# Patient Record
Sex: Female | Born: 1990 | Race: Black or African American | Hispanic: No | Marital: Single | Smoking: Never smoker
Health system: Southern US, Community
[De-identification: ages and names within clinical notes are randomized; demographics above are authoritative.]

## PROBLEM LIST (undated history)

## (undated) DIAGNOSIS — F419 Anxiety disorder, unspecified: Secondary | ICD-10-CM

## (undated) DIAGNOSIS — F32A Depression, unspecified: Secondary | ICD-10-CM

## (undated) DIAGNOSIS — G35 Multiple sclerosis: Secondary | ICD-10-CM

## (undated) DIAGNOSIS — F329 Major depressive disorder, single episode, unspecified: Secondary | ICD-10-CM

## (undated) HISTORY — DX: Anxiety disorder, unspecified: F41.9

## (undated) HISTORY — PX: NO PAST SURGERIES: SHX2092

## (undated) HISTORY — DX: Depression, unspecified: F32.A

## (undated) HISTORY — PX: TOOTH EXTRACTION: SUR596

## (undated) HISTORY — DX: Major depressive disorder, single episode, unspecified: F32.9

---
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
## 2010-10-19 LAB — CBC
MCV: 93.6
Platelets: 297
RBC: 3.39 — ABNORMAL LOW
WBC: 9.6

## 2010-10-19 LAB — WET PREP, GENITAL: Yeast Wet Prep HPF POC: NONE SEEN

## 2010-10-19 LAB — HCG, QUANTITATIVE, PREGNANCY

## 2010-12-11 ENCOUNTER — Emergency Department (INDEPENDENT_AMBULATORY_CARE_PROVIDER_SITE_OTHER)
Admission: EM | Admit: 2010-12-11 | Discharge: 2010-12-11 | Disposition: A | Discharge status: Home / Self Care | Payer: 59 | Source: Home / Self Care | Attending: Family Medicine | Admitting: Family Medicine

## 2010-12-11 DIAGNOSIS — N390 Urinary tract infection, site not specified: Secondary | ICD-10-CM

## 2010-12-11 LAB — POCT URINALYSIS DIP (DEVICE)
Glucose, UA: NEGATIVE mg/dL
Specific Gravity, Urine: >=1.03 (ref 1.005–1.030)
Urobilinogen, UA: 1 mg/dL (ref 0.0–1.0)
pH: 6 (ref 5.0–8.0)

## 2010-12-11 MED ORDER — CEPHALEXIN 500 MG PO CAPS: 500.0000 mg | ORAL_CAPSULE | Freq: Two times a day (BID) | ORAL | Status: AC

## 2010-12-11 MED ORDER — FLUCONAZOLE 150 MG PO TABS: 150.0000 mg | ORAL_TABLET | Freq: Once | ORAL | Status: AC

## 2010-12-11 NOTE — ED Notes (Signed)
After having sex on Thursday, pt. Reports Urinary frequency, burning.  Spotting when voids as well.  Denies fevers.

## 2010-12-11 NOTE — ED Provider Notes (Signed)
History     CSN: [id number] Arrival date & time: 12/11/2010  8:36 AM   First MD Initiated Contact with Patient 12/11/10 0818      No chief complaint on file.   (Consider location/radiation/quality/duration/timing/severity/associated sxs/prior treatment) HPI Comments: Abigail Lewis presents for evaluation of dysuria that started several days ago after having sexual intercourse. She denies any fever, back pain, nausea, or vomiting.   Patient is a 20 y.o. female presenting with dysuria.  Dysuria  This is a new problem. The current episode started more than 2 days ago. The problem occurs intermittently. The problem has not changed since onset.The quality of the pain is described as burning. The pain is mild. There has been no fever. She is sexually active. There is no history of pyelonephritis. Associated symptoms include hematuria and urgency. Pertinent negatives include no chills, no sweats, no nausea, no vomiting, no discharge and no frequency. She has tried nothing for the symptoms.    No past medical history on file.  No past surgical history on file.  No family history on file.  History  Substance Use Topics  . Smoking status: Not on file  . Smokeless tobacco: Not on file  . Alcohol Use: Not on file    OB History    No data available      Review of Systems  Constitutional: Negative for fever and chills.  HENT: Negative.   Eyes: Negative.   Respiratory: Negative.   Cardiovascular: Negative.   Gastrointestinal: Negative for nausea and vomiting.  Genitourinary: Positive for dysuria, urgency and hematuria. Negative for frequency.  Neurological: Negative.     Allergies  Review of patient's allergies indicates not on file.  Home Medications  No current outpatient prescriptions on file.  BP 121/69  Pulse 79  Temp(Src) 98.3 F (36.8 C) (Oral)  Resp 12  SpO2 100%  Physical Exam  Constitutional: She is oriented to person, place, and time. She appears well-developed  and well-nourished.  HENT:  Head: Normocephalic and atraumatic.  Eyes: EOM are normal.  Neck: Normal range of motion.  Pulmonary/Chest: Effort normal.  Abdominal: Soft. Normal appearance and bowel sounds are normal. There is no tenderness. There is no CVA tenderness.  Neurological: She is alert and oriented to person, place, and time.  Skin: Skin is warm and dry.    ED Course  Procedures (including critical care time)  Labs Reviewed  POCT URINALYSIS DIP (DEVICE) - Abnormal; Notable for the following:    Bilirubin Urine SMALL (*)    Ketones, ur 15 (*)    Hgb urine dipstick LARGE (*)    Protein, ur >=300 (*)    Leukocytes, UA MODERATE (*) Biochemical Testing Only. Please order routine urinalysis from main lab if confirmatory testing is needed.   All other components within normal limits  POCT PREGNANCY, URINE  POCT PREGNANCY, URINE  POCT URINALYSIS DIPSTICK   No results found.   No diagnosis found.    MDM  Labs reviewed: UA moderate LE, nitrite negative        Richardo Priest, MD 12/11/10 [id number]

## 2011-10-10 ENCOUNTER — Encounter: Payer: Self-pay | Admitting: Obstetrics and Gynecology

## 2011-10-29 ENCOUNTER — Encounter: Payer: Self-pay | Admitting: Obstetrics and Gynecology

## 2013-06-20 ENCOUNTER — Encounter (HOSPITAL_COMMUNITY): Payer: Self-pay | Admitting: *Deleted

## 2013-06-20 ENCOUNTER — Inpatient Hospital Stay (HOSPITAL_COMMUNITY)
Admission: AD | Admit: 2013-06-20 | Discharge: 2013-06-20 | Disposition: A | Discharge status: Home / Self Care | Payer: 59 | Source: Ambulatory Visit | Attending: Obstetrics and Gynecology | Admitting: Obstetrics and Gynecology

## 2013-06-20 ENCOUNTER — Inpatient Hospital Stay (HOSPITAL_COMMUNITY): Payer: 59

## 2013-06-20 DIAGNOSIS — O200 Threatened abortion: Secondary | ICD-10-CM

## 2013-06-20 LAB — WET PREP, GENITAL
Trich, Wet Prep: NONE SEEN
Yeast Wet Prep HPF POC: NONE SEEN

## 2013-06-20 LAB — CBC
HEMATOCRIT: 35.1 % — AB (ref 36.0–46.0)
HEMOGLOBIN: 11.8 g/dL — AB (ref 12.0–15.0)
MCH: 31 pg (ref 26.0–34.0)
MCHC: 33.6 g/dL (ref 30.0–36.0)
MCV: 92.1 fL (ref 78.0–100.0)
Platelets: 196 10*3/uL (ref 150–400)
RBC: 3.81 MIL/uL — AB (ref 3.87–5.11)
RDW: 13 % (ref 11.5–15.5)
WBC: 8.9 10*3/uL (ref 4.0–10.5)

## 2013-06-20 LAB — POCT PREGNANCY, URINE: Preg Test, Ur: POSITIVE — AB

## 2013-06-20 LAB — HCG, QUANTITATIVE, PREGNANCY: hCG, Beta Chain, Quant, S: 645 m[IU]/mL — ABNORMAL HIGH (ref <5)

## 2013-06-20 IMAGING — US US OB COMP LESS 14 WK
1 series · 14 of 28 positions shown · non-contrast
Comparison: None.

CLINICAL DATA: Vaginal bleeding.

EXAM:
OBSTETRIC <14 WK US AND TRANSVAGINAL OB US
TECHNIQUE: Both transabdominal and transvaginal ultrasound examinations were
performed for complete evaluation of the gestation as well as the
maternal uterus, adnexal regions, and pelvic cul-de-sac.
Transvaginal technique was performed to assess early pregnancy.

[Series 1: us ob comp less 14 wks · 14 of 59 slices shown]
[im 3/59]
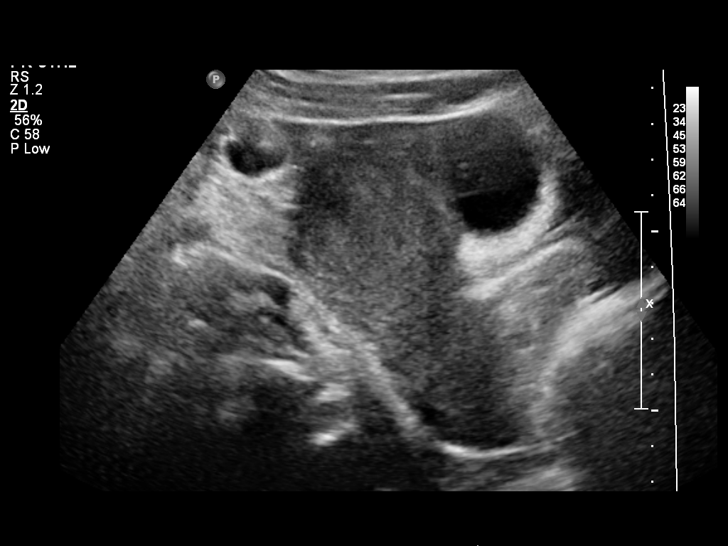
[im 7/59]
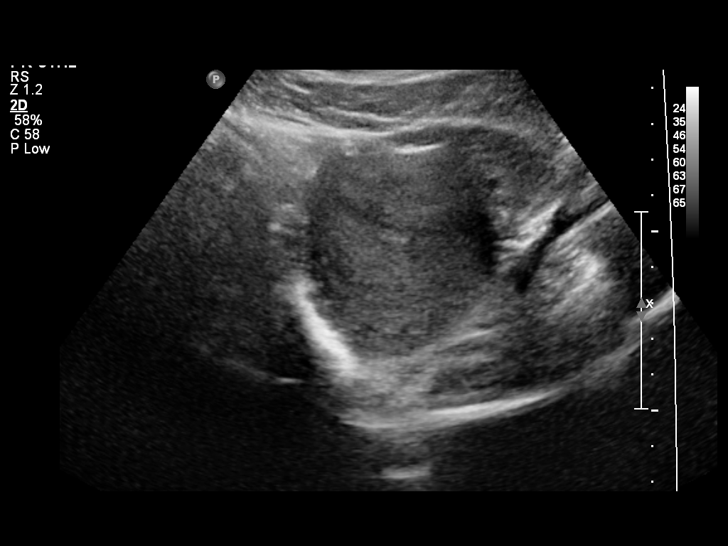
[im 11/59]
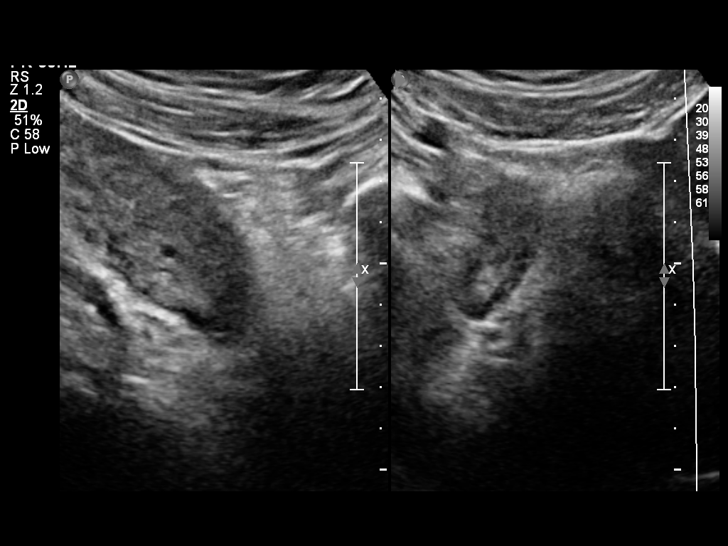
[im 16/59]
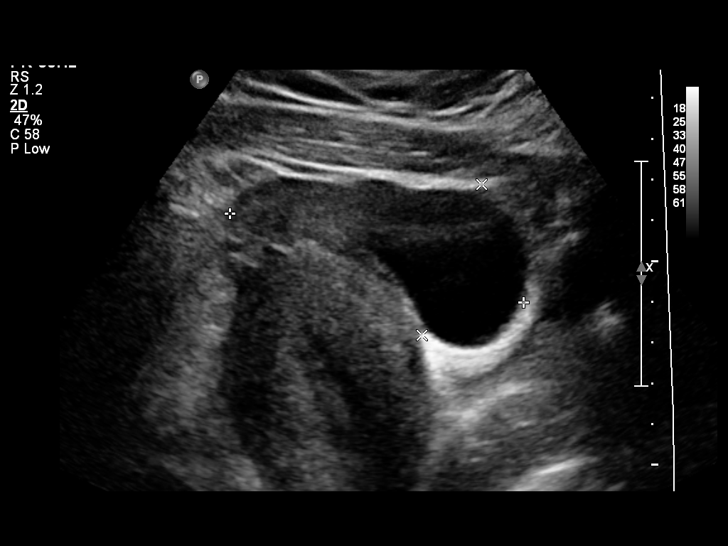
[im 20/59]
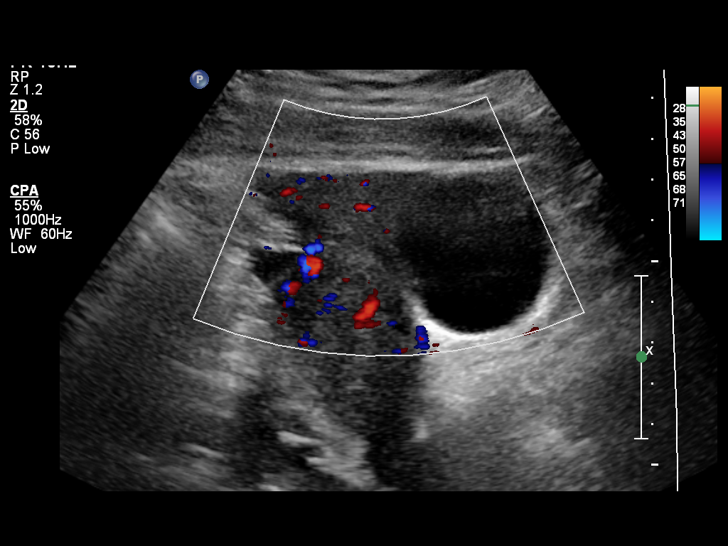
[im 24/59]
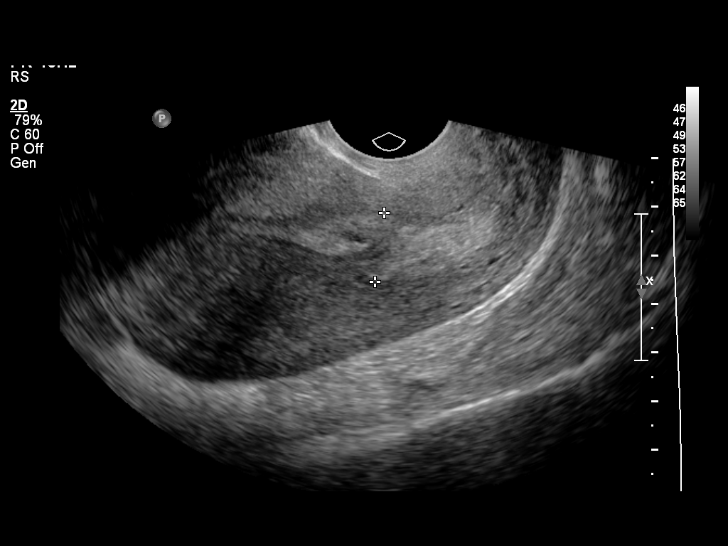
[im 28/59]
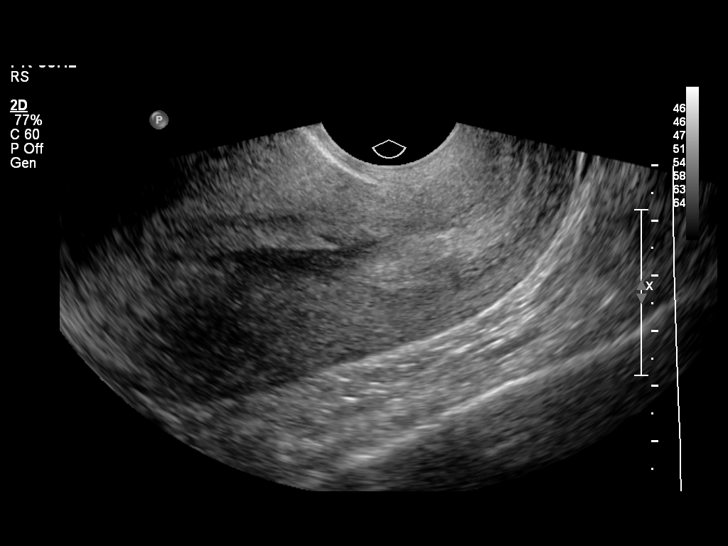
[im 33/59]
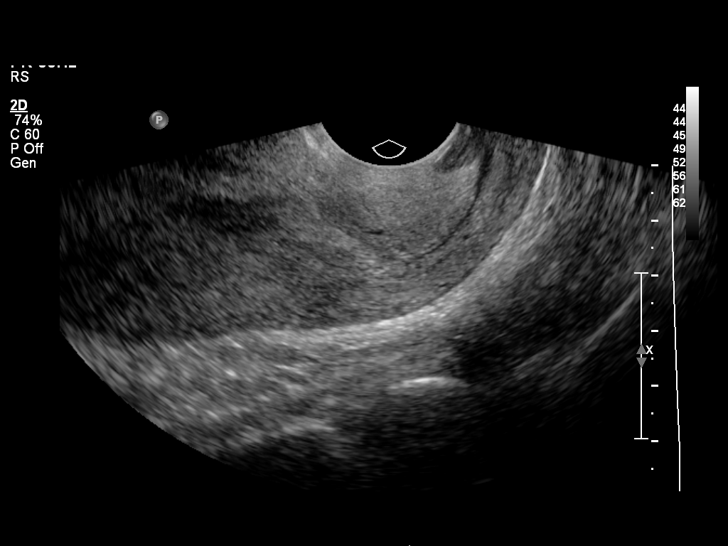
[im 37/59]
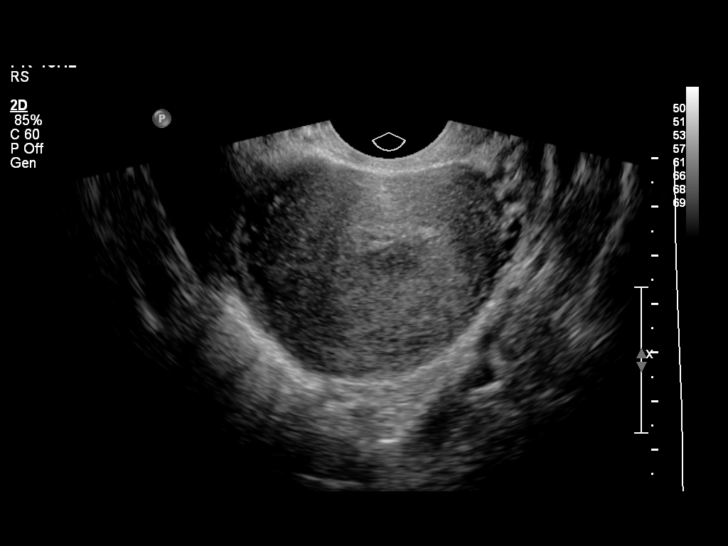
[im 41/59]
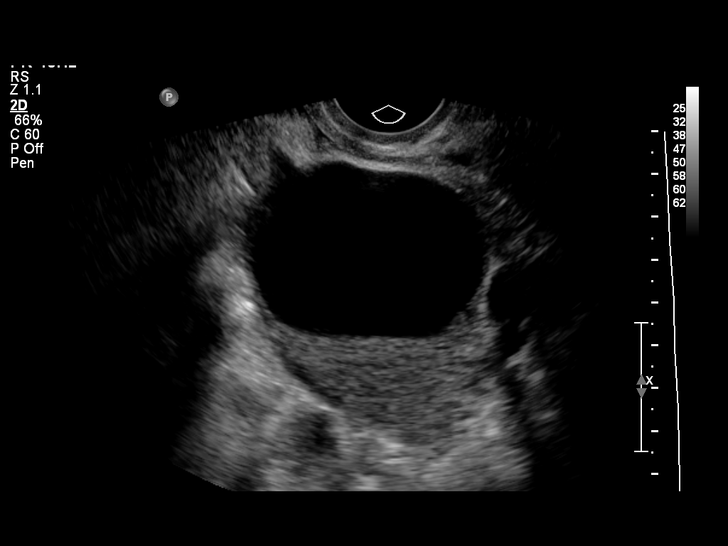
[im 46/59]
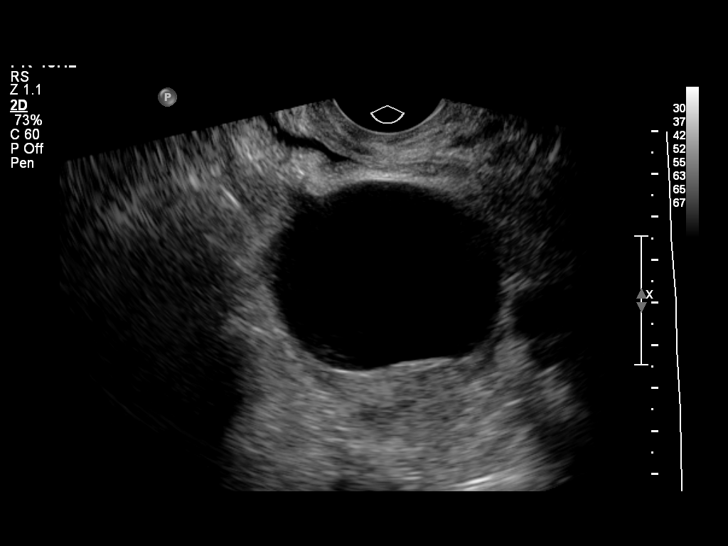
[im 50/59]
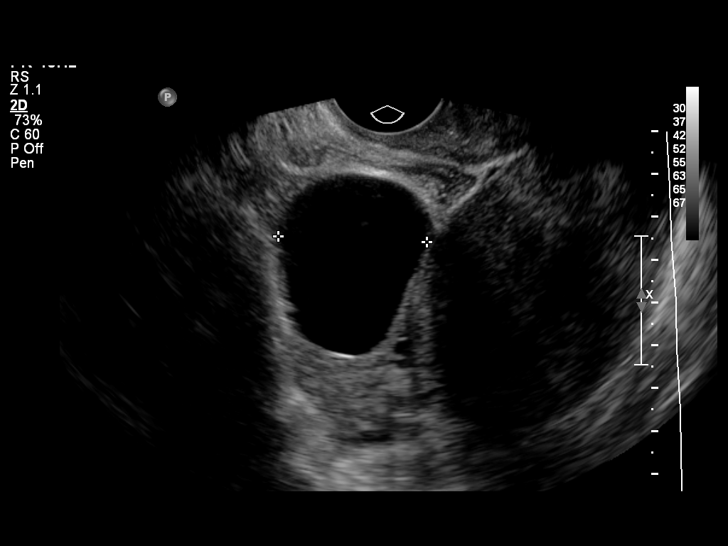
[im 54/59]
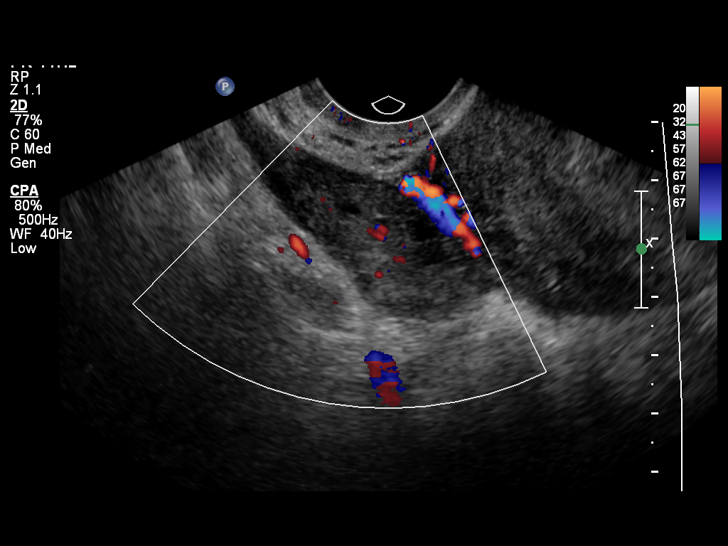
[im 59/59]
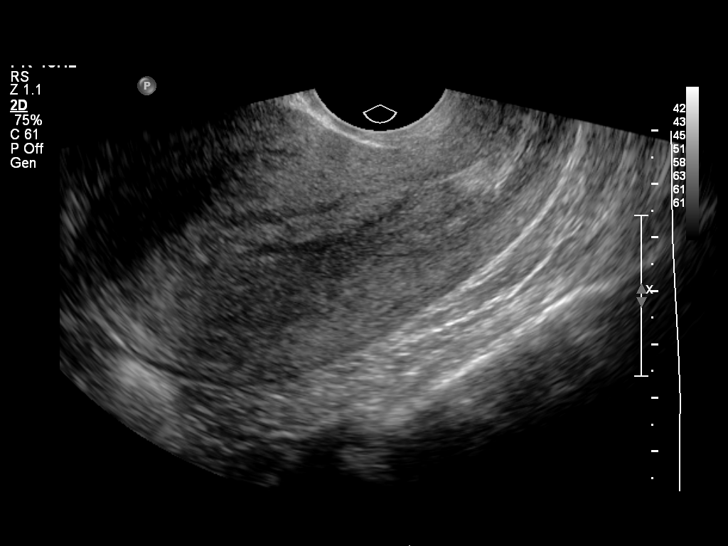

[14 of 28 positions shown; findings below may reference images not displayed]

FINDINGS: Intrauterine gestational sac: None

Yolk sac:  None

Embryo:  None

Cardiac Activity: None

Heart Rate:  Not applicable bpm

Maternal uterus/adnexae:

Subchorionic hemorrhage: Not apply

Right ovary: Normal

Left ovary: There is a cyst within the left ovary measuring 5.3 x
4.2 x 3.5 cm.

Other :The endometrium is thickened and has a heterogeneous
appearance measuring 14.3 mm.

Free fluid:  No free fluid.
IMPRESSION: 1. No intrauterine gestational sac, yolk sac, or fetal pole
identified. Differential considerations include intrauterine
pregnancy too early to be sonographically visualized, missed
abortion, or ectopic pregnancy. Followup ultrasound is recommended
in 10-14 days for further evaluation.

2.  Left ovarian cyst

## 2013-06-20 NOTE — ED Provider Notes (Deleted)
History    CSN: [id number]  Arrival date and time: 06/20/13 0920   First Provider Initiated Contact with Patient 06/20/13 0959            Chief Complaint   Patient presents with   .  Vaginal Bleeding    HPI Abigail Lewis is a 23 y.o. G2P0010 at [redacted]w[redacted]d who present with vaginal bleeding and abdominal pain. Last normal period was 4/4; states she has regular periods monthly. Not on birth control & doesn't use condoms. Has been with same partner x 2 years. Had negative HPT on 05/31/13.  Lower abdominal cramping x 1 week. Pain is intermittent & pt rates 6/10.Taken ibuprofen with no relief. No aggravating factors. Denies dysuria/vaginal discharge/vaginal irritation/nausea/vomiting/fevers.  Had light Franzoni spotting that began on 5/7 & lasted for 3 weeks. Heavier vaginal bleeding began 1 week ago with the abdominal cramping. Bleeding has been pink & red; has not saturated pads. States fills overnight pads while she is sleeping but otherwise 1 pad lasts throughout the day. On Monday, reports small clots & 1 large clot that looked clear & gelatinous. Denies lightheadedness or LOC.        Past Medical History   Diagnosis  Date   .  Medical history non-contributory         Past Surgical History   Procedure  Laterality  Date   .  No past surgeries         History reviewed. No pertinent family history.    History   Substance Use Topics   .  Smoking status:  Never Smoker    .  Smokeless tobacco:  Never Used   .  Alcohol Use:  No    Allergies: No Known Allergies    No prescriptions prior to admission     Review of Systems  Constitutional: Negative for fever, chills and malaise/fatigue.  Respiratory: Negative.   Cardiovascular: Negative.   Gastrointestinal: Positive for abdominal pain and diarrhea (watery Tuman stools x 3 since yesterday). Negative for heartburn, nausea, vomiting and blood in stool.  Genitourinary: Negative.   Neurological: Negative for dizziness and loss of  consciousness.     Physical Exam   Blood pressure 126/72, pulse 84, temperature 98.3 F (36.8 C), temperature source Oral, resp. rate 18, height 5' 2" (1.575 m), weight 180 lb (81.647 kg), last menstrual period 04/25/2013.  Physical Exam  Constitutional: She appears well-developed and well-nourished. No distress.  Cardiovascular: Normal rate, regular rhythm and normal heart sounds.   Respiratory: Effort normal and breath sounds normal. No respiratory distress.  GI: Soft. Bowel sounds are normal. She exhibits no distension and no mass. There is no tenderness. There is no rebound and no guarding.  Genitourinary: Vagina normal. Cervix exhibits no motion tenderness and no friability. Right adnexum displays no mass and no tenderness. Left adnexum displays no mass and no tenderness. No vaginal discharge found.  Moderate amount of dark red blood removed from vaginal vault; small amount of dark red blood coming from cervical os. Cervix closed, external os dilated finger tip.      MAU Course    Procedures    Results for orders placed during the hospital encounter of 06/20/13 (from the past 24 hour(s))   POCT PREGNANCY, URINE     Status: Abnormal     Collection Time      06/20/13  9:40 AM       Result  Value  Ref Range     Preg Test, Ur  POSITIVE (*)    NEGATIVE   CBC     Status: Abnormal     Collection Time      06/20/13 10:02 AM       Result  Value  Ref Range     WBC  8.9   4.0 - 10.5 K/uL     RBC  3.81 (*)  3.87 - 5.11 MIL/uL     Hemoglobin  11.8 (*)  12.0 - 15.0 g/dL     HCT  35.1 (*)  36.0 - 46.0 %     MCV  92.1   78.0 - 100.0 fL     MCH  31.0   26.0 - 34.0 pg     MCHC  33.6   30.0 - 36.0 g/dL     RDW  13.0   11.5 - 15.5 %     Platelets  196   150 - 400 K/uL   HCG, QUANTITATIVE, PREGNANCY     Status: Abnormal     Collection Time      06/20/13 10:03 AM       Result  Value  Ref Range     hCG, Beta Chain, Quant, S  645 (*)  <5 mIU/mL   WET PREP, GENITAL     Status: Abnormal      Collection Time      06/20/13 10:35 AM       Result  Value  Ref Range     Yeast Wet Prep HPF POC  NONE SEEN   NONE SEEN     Trich, Wet Prep  NONE SEEN   NONE SEEN     Clue Cells Wet Prep HPF POC  MODERATE (*)  NONE SEEN     WBC, Wet Prep HPF POC  MANY (*)  NONE SEEN       Us Ob Comp Less 14 Wks   06/20/2013   CLINICAL DATA:  Vaginal bleeding.  EXAM: OBSTETRIC <14 WK US AND TRANSVAGINAL OB US  TECHNIQUE: Both transabdominal and transvaginal ultrasound examinations were performed for complete evaluation of the gestation as well as the maternal uterus, adnexal regions, and pelvic cul-de-sac. Transvaginal technique was performed to assess early pregnancy.  COMPARISON:  None.  FINDINGS: Intrauterine gestational sac: None  Yolk sac:  None  Embryo:  None  Cardiac Activity: None  Heart Rate:  Not applicable bpm  Maternal uterus/adnexae:  Subchorionic hemorrhage: Not apply  Right ovary: Normal  Left ovary: There is a cyst within the left ovary measuring 5.3 x 4.2 x 3.5 cm.  Other :The endometrium is thickened and has a heterogeneous appearance measuring 14.3 mm.  Free fluid:  No free fluid.  IMPRESSION: 1. No intrauterine gestational sac, yolk sac, or fetal pole identified. Differential considerations include intrauterine pregnancy too early to be sonographically visualized, missed abortion, or ectopic pregnancy. Followup ultrasound is recommended in 10-14 days for further evaluation.  2.  Left ovarian cyst   Electronically Signed   By: Taylor  Stroud M.D.   On: 06/20/2013 11:36    Us Ob Transvaginal   06/20/2013   CLINICAL DATA:  Vaginal bleeding.  EXAM: OBSTETRIC <14 WK US AND TRANSVAGINAL OB US  TECHNIQUE: Both transabdominal and transvaginal ultrasound examinations were performed for complete evaluation of the gestation as well as the maternal uterus, adnexal regions, and pelvic cul-de-sac. Transvaginal technique was performed to assess early pregnancy.  COMPARISON:  None.  FINDINGS: Intrauterine  gestational sac: None  Yolk sac:  None  Embryo:  None  Cardiac Activity: None  Heart   Rate:  Not applicable bpm  Maternal uterus/adnexae:  Subchorionic hemorrhage: Not apply  Right ovary: Normal  Left ovary: There is a cyst within the left ovary measuring 5.3 x 4.2 x 3.5 cm.  Other :The endometrium is thickened and has a heterogeneous appearance measuring 14.3 mm.  Free fluid:  No free fluid.  IMPRESSION: 1. No intrauterine gestational sac, yolk sac, or fetal pole identified. Differential considerations include intrauterine pregnancy too early to be sonographically visualized, missed abortion, or ectopic pregnancy. Followup ultrasound is recommended in 10-14 days for further evaluation.  2.  Left ovarian cyst   Electronically Signed   By: Taylor  Stroud M.D.   On: 06/20/2013 11:36       Assessment and Plan       1.  Threatened miscarriage in early pregnancy         P:   Discharge home.   Vital signs stable. Pt to return to MAU on 6/1 for f/u HCG Discussed reasons to return to MAU. Discussed ectopic precautions.   Instructed pt to quit taking ibuprofen, can take Tylenol. Instructed pt to maintain pelvic rest until follow up.    Erin B Lawrence, Student-NP    06/20/2013, 12:53 PM    Saw this client with the student.  We have discussed the entire exam, labs, diagnosis, and plan.  Have reviewed the entire note and I am in agreement.  Rosamaria Donn, NP   Sabiha Sura L Ma Munoz, NP 06/20/13 1345      

## 2013-06-20 NOTE — MAU Note (Signed)
Started bleeding when expected period 3 wks ago.  Was light for 2 wks- then blood just started gushing out, 'passed something'.  Had a negative preg test the first wk when bleeding was light.

## 2013-06-20 NOTE — Discharge Instructions (Signed)
Vaginal Bleeding During Pregnancy, First Trimester A small amount of bleeding (spotting) from the vagina is relatively common in early pregnancy. It usually stops on its own. Various things may cause bleeding or spotting in early pregnancy. Some bleeding may be related to the pregnancy, and some may not. In most cases, the bleeding is normal and is not a problem. However, bleeding can also be a sign of something serious. Be sure to tell your health care provider about any vaginal bleeding right away. Some possible causes of vaginal bleeding during the first trimester include:  Infection or inflammation of the cervix.  Growths (polyps) on the cervix.  Miscarriage or threatened miscarriage.  Pregnancy tissue has developed outside of the uterus and in a fallopian tube (tubal pregnancy).  Tiny cysts have developed in the uterus instead of pregnancy tissue (molar pregnancy). HOME CARE INSTRUCTIONS  Watch your condition for any changes. The following actions may help to lessen any discomfort you are feeling:  Follow your health care provider's instructions for limiting your activity. If your health care provider orders bed rest, you may need to stay in bed and only get up to use the bathroom. However, your health care provider may allow you to continue light activity.  If needed, make plans for someone to help with your regular activities and responsibilities while you are on bed rest.  Keep track of the number of pads you use each day, how often you change pads, and how soaked (saturated) they are. Write this down.  Do not use tampons. Do not douche.  Do not have sexual intercourse or orgasms until approved by your health care provider.  If you pass any tissue from your vagina, save the tissue so you can show it to your health care provider.  Only take over-the-counter or prescription medicines as directed by your health care provider.  Do not take aspirin because it can make you  bleed.  Keep all follow-up appointments as directed by your health care provider. SEEK MEDICAL CARE IF:  You have any vaginal bleeding during any part of your pregnancy.  You have cramps or labor pains. SEEK IMMEDIATE MEDICAL CARE IF:   You have severe cramps in your back or belly (abdomen).  You have a fever, not controlled by medicine.  You pass large clots or tissue from your vagina.  Your bleeding increases.  You feel lightheaded or weak, or you have fainting episodes.  You have chills.  You are leaking fluid or have a gush of fluid from your vagina.  You pass out while having a bowel movement. MAKE SURE YOU:  Understand these instructions.  Will watch your condition.  Will get help right away if you are not doing well or get worse. Document Released: 10/18/2004 Document Revised: 10/29/2012 Document Reviewed: 09/15/2012 [facility] Patient Information 2014 [place].  Pelvic Rest Pelvic rest is sometimes recommended for women when:   The placenta is partially or completely covering the opening of the cervix (placenta previa).  There is bleeding between the uterine wall and the amniotic sac in the first trimester (subchorionic hemorrhage).  The cervix begins to open without labor starting (incompetent cervix, cervical insufficiency).  The labor is too early (preterm labor). HOME CARE INSTRUCTIONS  Do not have sexual intercourse, stimulation, or an orgasm.  Do not use tampons, douche, or put anything in the vagina.  Do not lift anything over 10 pounds (4.5 kg).  Avoid strenuous activity or straining your pelvic muscles. SEEK MEDICAL CARE IF:  You have any vaginal bleeding during pregnancy. Treat this as a potential emergency.  You have cramping pain felt low in the stomach (stronger than menstrual cramps).  You notice vaginal discharge (watery, mucus, or bloody).  You have a low, dull backache.  There are regular contractions or uterine  tightening. SEEK IMMEDIATE MEDICAL CARE IF: You have vaginal bleeding and have placenta previa.  Document Released: 05/05/2010 Document Revised: 04/02/2011 Document Reviewed: 05/05/2010 [facility] Patient Information 2014 [place], [place].

## 2013-06-21 NOTE — Progress Notes (Signed)
Attestation of Attending Supervision of Advanced Practitioner (CNM/NP): Evaluation and management procedures were performed by the Advanced Practitioner under my supervision and collaboration on 06/20/2013.  I have reviewed the Advanced Practitioner's note and chart, and I agree with the management and plan.  Lena Gores 06/21/2013 7:44 AM

## 2013-06-22 ENCOUNTER — Inpatient Hospital Stay (HOSPITAL_COMMUNITY)
Admission: AD | Admit: 2013-06-22 | Discharge: 2013-06-22 | Disposition: A | Discharge status: Home / Self Care | Payer: 59 | Source: Ambulatory Visit | Attending: Obstetrics & Gynecology | Admitting: Obstetrics & Gynecology

## 2013-06-22 DIAGNOSIS — O200 Threatened abortion: Secondary | ICD-10-CM

## 2013-06-22 LAB — GC/CHLAMYDIA PROBE AMP
CT Probe RNA: NEGATIVE
GC PROBE AMP APTIMA: NEGATIVE

## 2013-06-22 LAB — HCG, QUANTITATIVE, PREGNANCY: HCG, BETA CHAIN, QUANT, S: 283 m[IU]/mL — AB (ref <5)

## 2013-06-22 NOTE — MAU Provider Note (Signed)
  History     CSN: [id number]  Arrival date and time: 06/22/13 0943   None     Chief Complaint  Patient presents with  . Follow-up   HPI Comments: Abigail Lewis 23 y.o. G2P0010 [redacted]w[redacted]d presents to MAU for repeat BHCG. She was seen on 5/30 and her quant was 645. She has been bleeding with pad change q6 hours. She denies any pain. Her ultrasound from 5/30 showed no IUGS, yolk sac or fetal pole.      Past Medical History  Diagnosis Date  . Medical history non-contributory     Past Surgical History  Procedure Laterality Date  . No past surgeries      No family history on file.  History  Substance Use Topics  . Smoking status: Never Smoker   . Smokeless tobacco: Never Used  . Alcohol Use: No    Allergies: No Known Allergies  Prescriptions prior to admission  Medication Sig Dispense Refill  . Melatonin-Pyridoxine (MELATIN PO) Take 4 tablets by mouth at bedtime as needed (SLEEP).        Review of Systems  Constitutional: Negative.   HENT: Negative.   Eyes: Negative.   Respiratory: Negative.   Cardiovascular: Negative.   Genitourinary:       Vaginal bleeding, no pain  Skin: Negative.   Neurological: Negative.   Psychiatric/Behavioral: Negative.    Physical Exam   Blood pressure 124/83, pulse 75, temperature 98.8 F (37.1 C), temperature source Oral, resp. rate 16, last menstrual period 04/25/2013, SpO2 97.00%.  Physical Exam  Constitutional: She is oriented to person, place, and time. She appears well-developed and well-nourished. No distress.  HENT:  Head: Normocephalic and atraumatic.  Eyes: Pupils are equal, round, and reactive to light.  Genitourinary:  Not examined  Musculoskeletal: Normal range of motion.  Neurological: She is alert and oriented to person, place, and time.  Skin: Skin is warm and dry.  Psychiatric:  tearful    MAU Course  Procedures  MDM   Assessment and Plan   A: Threatened Miscarriage  P: Repeat Quant in Clinic on  Thursday June 4th between 8-11 am Continued Miscarriage precautions Rest/ Fluids/ Motrin as needed Return to MAU if pain developes   Doralee Albino Laroy Mustard 06/22/2013, 11:25 AM

## 2013-06-22 NOTE — Discharge Instructions (Signed)
Threatened Miscarriage   A threatened miscarriage is a pregnancy that may end. It may be marked by bleeding during the first 20 weeks of pregnancy. Often, the pregnancy can continue without any more problems. You may be asked to stop:  · Having sex (intercourse).  · Having orgasms.  · Using tampons.  · Exercising.  · Doing heavy physical activity and work.  HOME CARE   · Your doctor may tell you to take bed rest and to stop activities and work.  · Write down the number of pads you use each day. Write down how often you change pads. Write down how soaked they are.  · Follow your doctor's advice for follow-up visits and tests.  · If your blood type is Rh-negative and the father's blood is Rh-positive (or is not known), you may get a shot to protect the baby.  · If you have a miscarriage, save all the tissue you pass in a container. Take the container to your doctor.  GET HELP RIGHT AWAY IF:   · You have bad cramps or pain in your belly (abdomen), lower belly, or back.  · You have a fever or chills.  · Your bleeding gets worse or you pass large clots of blood or tissue. Save this tissue to show your doctor.  · You feel lightheaded, weak, dizzy, or pass out (faint).  · You have a gush of fluid from your vagina.  MAKE SURE YOU:   · Understand these instructions.  · Will watch your condition.  · Will get help right away if you are not doing well or get worse.  Document Released: 12/22/2007 Document Revised: 04/02/2011 Document Reviewed: 01/24/2009  [employer]® Patient Information ©2014 [employer].

## 2013-06-22 NOTE — MAU Note (Signed)
Patient to MAU for repeat BHCG. Patient denies pain but states she has bleeding and changes her pad about every 6 hours. No heavy bleeding.

## 2013-06-23 ENCOUNTER — Encounter: Payer: Self-pay | Admitting: Nurse Practitioner

## 2013-06-25 ENCOUNTER — Telehealth: Payer: Self-pay

## 2013-06-25 ENCOUNTER — Other Ambulatory Visit: Payer: 59

## 2013-06-25 DIAGNOSIS — O200 Threatened abortion: Secondary | ICD-10-CM

## 2013-06-25 LAB — HCG, QUANTITATIVE, PREGNANCY: hCG, Beta Chain, Quant, S: 144.9 m[IU]/mL

## 2013-06-25 NOTE — Telephone Encounter (Signed)
Received report from [facility], from [facility], and was informed that pts beta quant 144.9.  Per Dr. Debroah Loop pt needs come in one week for rpt beta and then a gyn f/u one week after that appt.   Called pt and I informed pt that her quant level is 144 which is going down like it is suppose to.  I advised her that the provider would like her to come in on June 11th for another quant level and then a week after that a visit with provider.  Pt stated "ok, I can be there at 0900 on the 11th."

## 2013-07-02 ENCOUNTER — Other Ambulatory Visit: Payer: 59

## 2013-07-02 DIAGNOSIS — N925 Other specified irregular menstruation: Secondary | ICD-10-CM

## 2013-07-02 DIAGNOSIS — N949 Unspecified condition associated with female genital organs and menstrual cycle: Secondary | ICD-10-CM

## 2013-07-03 LAB — HCG, QUANTITATIVE, PREGNANCY: hCG, Beta Chain, Quant, S: 17.8 m[IU]/mL

## 2013-07-09 ENCOUNTER — Ambulatory Visit: Payer: 59 | Admitting: Obstetrics & Gynecology

## 2013-07-14 ENCOUNTER — Telehealth: Payer: Self-pay

## 2013-07-14 NOTE — Telephone Encounter (Signed)
Message copied by Louanna Raw on Tue Jul 14, 2013  8:21 AM ------      Message from: Lesly Dukes      Created: Mon Jul 13, 2013  4:39 PM       Pt having good drop in Beta HCG.  Pt should come back for this week for beta hcg and discussion of birth control. ------

## 2013-07-14 NOTE — Telephone Encounter (Signed)
Called patieny and informed her of results. Patient verbalized understanding. No questions or concerns. Patient has appointment 07/22/13 to see provider and repeat quant.

## 2013-07-17 NOTE — MAU Provider Note (Signed)
History    CSN: [id number]  Arrival date and time: 06/20/13 9379   First Provider Initiated Contact with Patient 06/20/13 [phone]            Chief Complaint   Patient presents with   .  Vaginal Bleeding    HPI Abigail Lewis is a 23 y.o. G2P0010 at [redacted]w[redacted]d who present with vaginal bleeding and abdominal pain. Last normal period was 4/4; states she has regular periods monthly. Not on birth control & doesn't use condoms. Has been with same partner x 2 years. Had negative HPT on 05/31/13.  Lower abdominal cramping x 1 week. Pain is intermittent & pt rates 6/10.Taken ibuprofen with no relief. No aggravating factors. Denies dysuria/vaginal discharge/vaginal irritation/nausea/vomiting/fevers.  Had light Erekson spotting that began on 5/7 & lasted for 3 weeks. Heavier vaginal bleeding began 1 week ago with the abdominal cramping. Bleeding has been pink & red; has not saturated pads. States fills overnight pads while she is sleeping but otherwise 1 pad lasts throughout the day. On Monday, reports small clots & 1 large clot that looked clear & gelatinous. Denies lightheadedness or LOC.        Past Medical History   Diagnosis  Date   .  Medical history non-contributory         Past Surgical History   Procedure  Laterality  Date   .  No past surgeries         History reviewed. No pertinent family history.    History   Substance Use Topics   .  Smoking status:  Never Smoker    .  Smokeless tobacco:  Never Used   .  Alcohol Use:  No    Allergies: No Known Allergies    No prescriptions prior to admission     Review of Systems  Constitutional: Negative for fever, chills and malaise/fatigue.  Respiratory: Negative.   Cardiovascular: Negative.   Gastrointestinal: Positive for abdominal pain and diarrhea (watery Geppert stools x 3 since yesterday). Negative for heartburn, nausea, vomiting and blood in stool.  Genitourinary: Negative.   Neurological: Negative for dizziness and loss of  consciousness.     Physical Exam   Blood pressure 126/72, pulse 84, temperature 98.3 F (36.8 C), temperature source Oral, resp. rate 18, height 5\' 2"  (1.575 m), weight 180 lb (81.647 kg), last menstrual period 04/25/2013.  Physical Exam  Constitutional: She appears well-developed and well-nourished. No distress.  Cardiovascular: Normal rate, regular rhythm and normal heart sounds.   Respiratory: Effort normal and breath sounds normal. No respiratory distress.  GI: Soft. Bowel sounds are normal. She exhibits no distension and no mass. There is no tenderness. There is no rebound and no guarding.  Genitourinary: Vagina normal. Cervix exhibits no motion tenderness and no friability. Right adnexum displays no mass and no tenderness. Left adnexum displays no mass and no tenderness. No vaginal discharge found.  Moderate amount of dark red blood removed from vaginal vault; small amount of dark red blood coming from cervical os. Cervix closed, external os dilated finger tip.      MAU Course    Procedures    Results for orders placed during the hospital encounter of 06/20/13 (from the past 24 hour(s))   POCT PREGNANCY, URINE     Status: Abnormal     Collection Time      06/20/13  9:40 AM       Result  Value  Ref Range     Preg Test, Ur  POSITIVE (*)  NEGATIVE   CBC     Status: Abnormal     Collection Time      06/20/13 10:02 AM       Result  Value  Ref Range     WBC  8.9   4.0 - 10.5 K/uL     RBC  3.81 (*)  3.87 - 5.11 MIL/uL     Hemoglobin  11.8 (*)  12.0 - 15.0 g/dL     HCT  16.1 (*)  09.6 - 46.0 %     MCV  92.1   78.0 - 100.0 fL     MCH  31.0   26.0 - 34.0 pg     MCHC  33.6   30.0 - 36.0 g/dL     RDW  04.5   40.9 - 15.5 %     Platelets  196   150 - 400 K/uL   HCG, QUANTITATIVE, PREGNANCY     Status: Abnormal     Collection Time      06/20/13 10:03 AM       Result  Value  Ref Range     hCG, Beta Chain, Quant, S  645 (*)  <5 mIU/mL   WET PREP, GENITAL     Status: Abnormal      Collection Time      06/20/13 10:35 AM       Result  Value  Ref Range     Yeast Wet Prep HPF POC  NONE SEEN   NONE SEEN     Trich, Wet Prep  NONE SEEN   NONE SEEN     Clue Cells Wet Prep HPF POC  MODERATE (*)  NONE SEEN     WBC, Wet Prep HPF POC  MANY (*)  NONE SEEN       US Ob Comp Less 14 Wks   06/20/2013   CLINICAL DATA:  Vaginal bleeding.  EXAM: OBSTETRIC <14 WK [place] AND TRANSVAGINAL OB US  TECHNIQUE: Both transabdominal and transvaginal ultrasound examinations were performed for complete evaluation of the gestation as well as the maternal uterus, adnexal regions, and pelvic cul-de-sac. Transvaginal technique was performed to assess early pregnancy.  COMPARISON:  None.  FINDINGS: Intrauterine gestational sac: None  Yolk sac:  None  Embryo:  None  Cardiac Activity: None  Heart Rate:  Not applicable bpm  Maternal uterus/adnexae:  Subchorionic hemorrhage: Not apply  Right ovary: Normal  Left ovary: There is a cyst within the left ovary measuring 5.3 x 4.2 x 3.5 cm.  Other :The endometrium is thickened and has a heterogeneous appearance measuring 14.3 mm.  Free fluid:  No free fluid.  IMPRESSION: 1. No intrauterine gestational sac, yolk sac, or fetal pole identified. Differential considerations include intrauterine pregnancy too early to be sonographically visualized, missed abortion, or ectopic pregnancy. Followup ultrasound is recommended in 10-14 days for further evaluation.  2.  Left ovarian cyst   Electronically Signed   By: Signa Kell M.D.   On: 06/20/2013 11:36    US Ob Transvaginal   06/20/2013   CLINICAL DATA:  Vaginal bleeding.  EXAM: OBSTETRIC <14 WK [place] AND TRANSVAGINAL OB US  TECHNIQUE: Both transabdominal and transvaginal ultrasound examinations were performed for complete evaluation of the gestation as well as the maternal uterus, adnexal regions, and pelvic cul-de-sac. Transvaginal technique was performed to assess early pregnancy.  COMPARISON:  None.  FINDINGS: Intrauterine  gestational sac: None  Yolk sac:  None  Embryo:  None  Cardiac Activity: None  Heart  Rate:  Not applicable bpm  Maternal uterus/adnexae:  Subchorionic hemorrhage: Not apply  Right ovary: Normal  Left ovary: There is a cyst within the left ovary measuring 5.3 x 4.2 x 3.5 cm.  Other :The endometrium is thickened and has a heterogeneous appearance measuring 14.3 mm.  Free fluid:  No free fluid.  IMPRESSION: 1. No intrauterine gestational sac, yolk sac, or fetal pole identified. Differential considerations include intrauterine pregnancy too early to be sonographically visualized, missed abortion, or ectopic pregnancy. Followup ultrasound is recommended in 10-14 days for further evaluation.  2.  Left ovarian cyst   Electronically Signed   By: Signa Kellaylor  Stroud M.D.   On: 06/20/2013 11:36       Assessment and Plan       1.  Threatened miscarriage in early pregnancy         P:   Discharge home.   Vital signs stable. Pt to return to MAU on 6/1 for f/u HCG Discussed reasons to return to MAU. Discussed ectopic precautions.   Instructed pt to quit taking ibuprofen, can take Tylenol. Instructed pt to maintain pelvic rest until follow up.    Claudie ReveringErin B Lawrence, Student-NP    06/20/2013, 12:53 PM    Saw this client with the student.  We have discussed the entire exam, labs, diagnosis, and plan.  Have reviewed the entire note and I am in agreement.  Nolene Bernheimerri Burleson, NP   Currie Pariserri L Burleson, NP 06/20/13 1345

## 2013-07-17 NOTE — MAU Provider Note (Signed)
Attestation of Attending Supervision of Advanced Practitioner (CNM/NP): Evaluation and management procedures were performed by the Advanced Practitioner under my supervision and collaboration.  I have reviewed the Advanced Practitioner's note and chart, and I agree with the management and plan.  CONSTANT,PEGGY 07/17/2013 8:55 AM

## 2013-07-22 ENCOUNTER — Ambulatory Visit: Payer: 59 | Admitting: Obstetrics & Gynecology

## 2013-07-22 ENCOUNTER — Telehealth: Payer: Self-pay | Admitting: *Deleted

## 2013-07-22 NOTE — Telephone Encounter (Signed)
Called patient, no answer, left message that patient missed appointment and to please call and reschedule.

## 2013-08-17 ENCOUNTER — Ambulatory Visit: Payer: 59 | Admitting: Obstetrics & Gynecology

## 2013-11-23 ENCOUNTER — Encounter (HOSPITAL_COMMUNITY): Payer: Self-pay | Admitting: *Deleted

## 2014-02-15 ENCOUNTER — Encounter (HOSPITAL_COMMUNITY): Payer: Self-pay

## 2014-04-25 ENCOUNTER — Encounter (HOSPITAL_COMMUNITY): Payer: Self-pay | Admitting: *Deleted

## 2015-12-30 ENCOUNTER — Emergency Department (HOSPITAL_COMMUNITY)
Admission: EM | Admit: 2015-12-30 | Discharge: 2015-12-30 | Disposition: A | Discharge status: Home / Self Care | Payer: Commercial Managed Care - HMO | Attending: Emergency Medicine | Admitting: Emergency Medicine

## 2015-12-30 ENCOUNTER — Encounter (HOSPITAL_COMMUNITY): Payer: Self-pay | Admitting: Nurse Practitioner

## 2015-12-30 DIAGNOSIS — R252 Cramp and spasm: Secondary | ICD-10-CM | POA: Diagnosis not present

## 2015-12-30 DIAGNOSIS — K0889 Other specified disorders of teeth and supporting structures: Secondary | ICD-10-CM | POA: Diagnosis not present

## 2015-12-30 MED ORDER — ACETAMINOPHEN 500 MG PO TABS: 1000.0000 mg | ORAL_TABLET | Freq: Three times a day (TID) | ORAL | 0 refills | Status: DC | PRN

## 2015-12-30 MED ORDER — PENICILLIN V POTASSIUM 500 MG PO TABS: 500.0000 mg | ORAL_TABLET | Freq: Four times a day (QID) | ORAL | 0 refills | Status: AC

## 2015-12-30 MED ORDER — CYCLOBENZAPRINE HCL 10 MG PO TABS: 10.0000 mg | ORAL_TABLET | Freq: Two times a day (BID) | ORAL | 0 refills | Status: DC | PRN

## 2015-12-30 NOTE — Discharge Instructions (Signed)
Medications: Penicillin, Flexeril, Tylenol  Treatment: Take penicillin as prescribed for 1 week. Make sure to finish all this medication. Take Flexeril twice daily as needed for muscle spasms. Do not drive or operate machinery when taking this medication do not take if you are pregnant. Take Tylenol as prescribed every 8 hours. DO NOT take more than 3500 mg of Tylenol a day. Use warm salt water rinses 3-4 times daily, especially after eating.  Follow-up: Please follow-up with a dentist as soon as possible for further evaluation and treatment of your symptoms. Please return to emergency department if you develop any new or worsening symptoms.  In addition to the resources attached, you can also call: [facility]  [employer] [facility]  [address]  [place], [place] [address]  Phone [phone]

## 2015-12-30 NOTE — ED Triage Notes (Signed)
Rt. Lower back molar, pain and unable to chew for 1 month.  No facial swelling noted.  Pt. Denies any n/v/d,.  Low grade fever 99.  Pt. Also reports that shed woke up with her rt. Arm feeling weak.  Pt. Reports having a muscle aching in her rt. Upper arm.

## 2015-12-30 NOTE — ED Provider Notes (Signed)
[facility]-EMERGENCY DEPT Provider Note   CSN: [id number] Arrival date & time: 12/30/15  1541  By signing my name below, I, Doreatha Martin, attest that this documentation has been prepared under the direction and in the presence of Buel Ream, PA-C. Electronically Signed: Doreatha Martin, ED Scribe. 12/30/15. 4:45 PM.    History   Chief Complaint Chief Complaint  Patient presents with  . Dental Pain    HPI Abigail Lewis is a 25 y.o. female otherwise healthy who presents to the Emergency Department complaining of moderate, persistent right lower dental pain for a month with associated right jaw pain, gingival bleeding. Pt states she is not able to associate the onset of her dental pain with any specific dental fracture or injury. Pt states she has been taking 400-800 mg ibuprofen throughout the month without significant relief of pain. Pt is not currently followed by dentistry. Pt also complains of weakness in her right arm that began this morning, beginning in her shoulder and moving down to her elbow. She describes her weakness as her muscles "feeling tired". No h/o similar symptoms. She denies numbness or weakness in her right arm. She also denies fever, CP, SOB, abdominal pain, nausea, vomiting, dysuria, hematuria, frequency, urgency, neck pain. NKDA.   The history is provided by the patient. No language interpreter was used.    Past Medical History:  Diagnosis Date  . Medical history non-contributory     There are no active problems to display for this patient.   Past Surgical History:  Procedure Laterality Date  . NO PAST SURGERIES      OB History    Gravida Para Term Preterm AB Living   2       1 0   SAB TAB Ectopic Multiple Live Births   1               Home Medications    Prior to Admission medications   Medication Sig Start Date End Date Taking? Authorizing Provider  acetaminophen (TYLENOL) 500 MG tablet Take 2 tablets (1,000 mg total) by mouth every 8 (eight) hours  as needed for moderate pain. 12/30/15   Emi Holes, PA-C  cyclobenzaprine (FLEXERIL) 10 MG tablet Take 1 tablet (10 mg total) by mouth 2 (two) times daily as needed for muscle spasms. 12/30/15   Emi Holes, PA-C  Melatonin-Pyridoxine (MELATIN PO) Take 4 tablets by mouth at bedtime as needed (SLEEP).    Historical Provider, MD  penicillin v potassium (VEETID) 500 MG tablet Take 1 tablet (500 mg total) by mouth 4 (four) times daily. 12/30/15 01/06/16  Emi Holes, PA-C    Family History History reviewed. No pertinent family history.  Social History Social History  Substance Use Topics  . Smoking status: Never Smoker  . Smokeless tobacco: Never Used  . Alcohol use No     Allergies   Patient has no known allergies.   Review of Systems Review of Systems  Constitutional: Negative for chills and fever.  HENT: Positive for dental problem. Negative for facial swelling and sore throat.   Respiratory: Negative for shortness of breath.   Cardiovascular: Negative for chest pain.  Gastrointestinal: Negative for abdominal pain, nausea and vomiting.  Genitourinary: Negative for dysuria, frequency, hematuria and urgency.  Musculoskeletal: Negative for back pain and neck pain.  Skin: Negative for rash and wound.  Neurological: Positive for weakness. Negative for numbness and headaches.  Psychiatric/Behavioral: The patient is not nervous/anxious.      Physical Exam  Updated Vital Signs BP 130/94 (BP Location: Left Arm)   Pulse 74   Temp 99.3 F (37.4 C) (Oral)   Resp 18   Ht 5\' 2"  (1.575 m)   Wt 79.4 kg   SpO2 100%   BMI 32.01 kg/m   Physical Exam  Constitutional: She appears well-developed and well-nourished. No distress.  Patient sitting looking very tense with her R shoulder higher that the L  HENT:  Head: Normocephalic and atraumatic.  Mouth/Throat: Uvula is midline, oropharynx is clear and moist and mucous membranes are normal. No trismus in the jaw. No dental  abscesses or uvula swelling. No oropharyngeal exudate, posterior oropharyngeal edema, posterior oropharyngeal erythema or tonsillar abscesses. No tonsillar exudate.    Tenderness to the right lower jaw, but no masses or lymph tenderness to the right side of the neck.   Eyes: Conjunctivae are normal. Pupils are equal, round, and reactive to light. Right eye exhibits no discharge. Left eye exhibits no discharge. No scleral icterus.  Neck: Normal range of motion. Neck supple. No thyromegaly present.  Cardiovascular: Normal rate, regular rhythm, normal heart sounds and intact distal pulses.  Exam reveals no gallop and no friction rub.   No murmur heard. Pulmonary/Chest: Effort normal and breath sounds normal. No stridor. No respiratory distress. She has no wheezes. She has no rales.  Abdominal: Soft. Bowel sounds are normal. She exhibits no distension. There is no tenderness. There is no rebound and no guarding.  Musculoskeletal: She exhibits no edema.       Right shoulder: She exhibits spasm.  Lymphadenopathy:    She has no cervical adenopathy.  Neurological: She is alert. Coordination normal.  Normal sensation to upper extremities; 5/5 strength in upper extremities (flexion and extension); equal bilateral grip strength   Skin: Skin is warm and dry. No rash noted. She is not diaphoretic. No pallor.  Psychiatric: She has a normal mood and affect.  Nursing note and vitals reviewed.    ED Treatments / Results   DIAGNOSTIC STUDIES: Oxygen Saturation is 100% on RA, normal by my interpretation.    COORDINATION OF CARE: 4:41 PM Discussed treatment plan with pt at bedside which includes Penicillin, dental resource guide and pt agreed to plan.    Labs (all labs ordered are listed, but only abnormal results are displayed) Labs Reviewed - No data to display  EKG  EKG Interpretation None       Radiology No results found.  Procedures Procedures (including critical care  time)  Medications Ordered in ED Medications - No data to display   Initial Impression / Assessment and Plan / ED Course  I have reviewed the triage vital signs and the nursing notes.  Pertinent labs & imaging results that were available during my care of the patient were reviewed by me and considered in my medical decision making (see chart for details).  Clinical Course     Patient with dentalgia. On exam, there is no evidence of a drainable abscess. No trismus, glossal elevation, unilateral tonsillar swelling. No evidence of retropharyngeal or peritonsillar abscess or Ludwig angina. Normal neuro exam with full motor strength and sensation of right upper extremity. Suspect patient has been tense due to her pain on the right side causing muscle spasm and fatigue. Will treat with Veetid, Flexeril and Tylenol. Pt instructed to follow-up with dentist as soon as possible. Resource guide provided with AVS. Discussed strict return precautions. Pt safe for discharge. Pt is agreeable to plan. I discussed patient case with  Dr. Karma Ganja who agrees with plan.    Final Clinical Impressions(s) / ED Diagnoses   Final diagnoses:  Pain, dental    New Prescriptions New Prescriptions   ACETAMINOPHEN (TYLENOL) 500 MG TABLET    Take 2 tablets (1,000 mg total) by mouth every 8 (eight) hours as needed for moderate pain.   CYCLOBENZAPRINE (FLEXERIL) 10 MG TABLET    Take 1 tablet (10 mg total) by mouth 2 (two) times daily as needed for muscle spasms.   PENICILLIN V POTASSIUM (VEETID) 500 MG TABLET    Take 1 tablet (500 mg total) by mouth 4 (four) times daily.    I personally performed the services described in this documentation, which was scribed in my presence. The recorded information has been reviewed and is accurate.    Emi Holes, PA-C 12/30/15 1703    Jerelyn Scott, MD 12/30/15 1710

## 2015-12-31 ENCOUNTER — Ambulatory Visit (INDEPENDENT_AMBULATORY_CARE_PROVIDER_SITE_OTHER): Payer: Commercial Managed Care - HMO | Admitting: Family Medicine

## 2015-12-31 VITALS — BP 120/70 | HR 94 | Temp 98.2°F | Resp 18 | Ht 62.0 in | Wt 183.0 lb

## 2015-12-31 DIAGNOSIS — K068 Other specified disorders of gingiva and edentulous alveolar ridge: Secondary | ICD-10-CM

## 2015-12-31 MED ORDER — DOXYCYCLINE HYCLATE 100 MG PO CAPS: 100.0000 mg | ORAL_CAPSULE | Freq: Two times a day (BID) | ORAL | 0 refills | Status: DC

## 2015-12-31 NOTE — Progress Notes (Signed)
   Patient ID: Abigail SchickDeaundra C Lewis, female    DOB: 03/08/1990, 25 y.o.   MRN: [id number]  PCP: Pcp Not In System  Chief Complaint  Patient presents with  . Dental Pain    Was seen in the ER yesterday - was given Penicillin, states after she took it the pain worsened and now jaw is swollen     Subjective:  New Patient HPI 25 year old female presents for evaluation of dental pain Felt a sharp pain in lower jaw area after taking penicillin. Pain has since resolved.  She has only taken Tylenol since for pain and has stopped pencillian. Woke up today and noticed that her right jaw was swollen and tender. Originally had bleeding gums, that has since resolved.  Social History   Social History  . Marital status: Single    Spouse name: N/A  . Number of children: N/A  . Years of education: N/A   Occupational History  . Not on file.   Social History Main Topics  . Smoking status: Never Smoker  . Smokeless tobacco: Never Used  . Alcohol use No  . Drug use: No  . Sexual activity: Yes    Birth control/ protection: Pill   Other Topics Concern  . Not on file   Social History Narrative  . No narrative on file    No family history on file.   Review of Systems See HPI   There are no active problems to display for this patient.    Prior to Admission medications   Medication Sig Start Date End Date Taking? Authorizing Provider  acetaminophen (TYLENOL) 500 MG tablet Take 2 tablets (1,000 mg total) by mouth every 8 (eight) hours as needed for moderate pain. 12/30/15  Yes Alexandra M Law, PA-C  cyclobenzaprine (FLEXERIL) 10 MG tablet Take 1 tablet (10 mg total) by mouth 2 (two) times daily as needed for muscle spasms. 12/30/15  Yes Alexandra M Law, PA-C  Melatonin-Pyridoxine (MELATIN PO) Take 4 tablets by mouth at bedtime as needed (SLEEP).   Yes Historical Provider, MD  penicillin v potassium (VEETID) 500 MG tablet Take 1 tablet (500 mg total) by mouth 4 (four) times daily. Patient  not taking: Reported on 12/31/2015 12/30/15 01/06/16  Emi HolesAlexandra M Law, PA-C  No Known Allergies     Objective:  Physical Exam  Constitutional: She is oriented to person, place, and time. She appears well-developed and well-nourished.  HENT:  Head: Normocephalic and atraumatic.    Nose: Nose normal.  Mouth/Throat: Oropharynx is clear and moist. Abnormal dentition.    Neck: Normal range of motion.  Cardiovascular: Normal rate.   Pulmonary/Chest: Effort normal.  Lymphadenopathy:    She has no cervical adenopathy.  Neurological: She is alert and oriented to person, place, and time.  Skin: Skin is warm and dry.  Psychiatric: She has a normal mood and affect. Her behavior is normal. Thought content normal.     Vitals:   12/31/15 1343  BP: 120/70  Pulse: 94  Resp: 18  Temp: 98.2 F (36.8 C)   Assessment & Plan:  1. Pain in gums  Plan: -Doxycycline 100 mg twice daily x 10 days -Follow-up with a dentist for further evaluation.  Godfrey PickKimberly S. Tiburcio PeaHarris, MSN, FNP-C [facility]

## 2015-12-31 NOTE — Patient Instructions (Addendum)
Start Doxycycline 100 mg twice daily x 7 days.  Take Benadryl 12 mg once or twice if needed to take to reduce swelling.   IF you received an x-ray today, you will receive an invoice from [facility]. Please contact [facility] at [phone] with questions or concerns regarding your invoice.   IF you received labwork today, you will receive an invoice from [employer]. Please contact [facility] at [phone] with questions or concerns regarding your invoice.   Our billing staff will not be able to assist you with questions regarding bills from these companies.  You will be contacted with the lab results as soon as they are available. The fastest way to get your results is to activate your My Chart account. Instructions are located on the last page of this paperwork. If you have not heard from [place] regarding the results in 2 weeks, please contact this office.     Dental Pain Introduction Dental pain may be caused by many things, including:  Tooth decay (cavities or caries). Cavities cause the nerve of your tooth to be open to air and hot or cold temperatures. This can cause pain or discomfort.  Abscess or infection. A dental abscess is an area that is full of infected pus from a bacterial infection in the inner part of the tooth (pulp). It usually happens at the end of the tooth's root.  Injury.  An unknown reason (idiopathic). Your pain may be mild or severe. It may only happen when:  You are chewing.  You are exposed to hot or cold temperature.  You are eating or drinking sugary foods or beverages, such as:  Soda.  Candy. Your pain may also be there all of the time. Follow these instructions at home: Watch your dental pain for any changes. Do these things to lessen your discomfort:  Take medicines only as told by your dentist.  If your dentist tells you to take an antibiotic medicine, finish all of it even if you start to feel  better.  Keep all follow-up visits as told by your dentist. This is important.  Do not apply heat to the outside of your face.  Rinse your mouth or gargle with salt water if told by your dentist. This helps with pain and swelling.  You can make salt water by adding  tsp of salt to 1 cup of warm water.  Apply ice to the painful area of your face:  Put ice in a plastic bag.  Place a towel between your skin and the bag.  Leave the ice on for 20 minutes, 2-3 times per day.  Avoid foods or drinks that cause you pain, such as:  Very hot or very cold foods or drinks.  Sweet or sugary foods or drinks. Contact a doctor if:  Your pain is not helped with medicines.  Your symptoms are worse.  You have new symptoms. Get help right away if:  You cannot open your mouth.  You are having trouble breathing or swallowing.  You have a fever.  Your face, neck, or jaw is puffy (swollen). This information is not intended to replace advice given to you by your health care provider. Make sure you discuss any questions you have with your health care provider. Document Released: 06/27/2007 Document Revised: 06/16/2015 Document Reviewed: 01/04/2014  2017 [employer]

## 2016-01-08 ENCOUNTER — Encounter: Payer: Self-pay | Admitting: Family Medicine

## 2016-01-14 ENCOUNTER — Other Ambulatory Visit: Payer: Self-pay | Admitting: Family Medicine

## 2016-07-12 ENCOUNTER — Ambulatory Visit: Payer: 59 | Attending: Family Medicine | Admitting: Licensed Clinical Social Worker

## 2016-07-12 ENCOUNTER — Encounter: Payer: Self-pay | Admitting: Family Medicine

## 2016-07-12 ENCOUNTER — Ambulatory Visit: Payer: 59 | Attending: Family Medicine | Admitting: Family Medicine

## 2016-07-12 VITALS — BP 106/69 | HR 73 | Temp 98.2°F | Resp 18 | Ht 63.0 in | Wt 169.6 lb

## 2016-07-12 DIAGNOSIS — F99 Mental disorder, not otherwise specified: Secondary | ICD-10-CM | POA: Diagnosis not present

## 2016-07-12 DIAGNOSIS — F4010 Social phobia, unspecified: Secondary | ICD-10-CM | POA: Diagnosis present

## 2016-07-12 DIAGNOSIS — G4700 Insomnia, unspecified: Secondary | ICD-10-CM | POA: Diagnosis not present

## 2016-07-12 DIAGNOSIS — F5105 Insomnia due to other mental disorder: Secondary | ICD-10-CM | POA: Diagnosis not present

## 2016-07-12 DIAGNOSIS — F411 Generalized anxiety disorder: Secondary | ICD-10-CM | POA: Diagnosis not present

## 2016-07-12 DIAGNOSIS — Z0000 Encounter for general adult medical examination without abnormal findings: Secondary | ICD-10-CM

## 2016-07-12 DIAGNOSIS — F329 Major depressive disorder, single episode, unspecified: Secondary | ICD-10-CM

## 2016-07-12 DIAGNOSIS — F419 Anxiety disorder, unspecified: Principal | ICD-10-CM

## 2016-07-12 DIAGNOSIS — Z13220 Encounter for screening for lipoid disorders: Secondary | ICD-10-CM | POA: Diagnosis not present

## 2016-07-12 DIAGNOSIS — Z8759 Personal history of other complications of pregnancy, childbirth and the puerperium: Secondary | ICD-10-CM | POA: Insufficient documentation

## 2016-07-12 MED ORDER — TRAZODONE HCL 50 MG PO TABS: 50.0000 mg | ORAL_TABLET | Freq: Every evening | ORAL | 1 refills | Status: DC | PRN

## 2016-07-12 MED ORDER — SERTRALINE HCL 25 MG PO TABS: 25.0000 mg | ORAL_TABLET | Freq: Every day | ORAL | 1 refills | Status: DC

## 2016-07-12 NOTE — Progress Notes (Signed)
Subjective:  Patient ID: Abigail Lewis, female    DOB: 06/13/1990  Age: 26 y.o. MRN: [id number]  CC: Establish Care   HPI Abigail Lewis presents for complains of anxiety disorder.  She has the following symptoms: difficulty concentrating, insomnia, racing thoughts, anxiety in social situations.. Onset of symptoms was approximately several years ago, gradually worsening since that time. She reports experiencing these symptoms since she was a teenager. She denies current suicidal and homicidal ideation. Family history significant for father with alcoholism, mother with bipolar depression, and grandmother with mental disorder. Possible organic causes contributing are: none. Risk factors: positive family history of mental disorder in grandmother, mother, and father. Previous treatment includes Zoloft and trazadone. She reports medication being changed from Zoloft to Trazodone for sleep. She denies any negative side effects from either treatment.She also reports receiving counseling services when she was a teenager but none since adulthood. She is agreeable to speaking with LCSW today. She reports recent history of annual physical from another clinic where she was told her cholesterol levels were borderline and was encouraged to follow up for recheck in 3 months. She is requesting cholesterol check today.Denies any history of HTN.  She denies any family history of HLD or heart disease. She does reports her mother has history of HTN.    Outpatient Medications Prior to Visit  Medication Sig Dispense Refill  . acetaminophen (TYLENOL) 500 MG tablet Take 2 tablets (1,000 mg total) by mouth every 8 (eight) hours as needed for moderate pain. (Patient not taking: Reported on 07/12/2016) 30 tablet 0  . cyclobenzaprine (FLEXERIL) 10 MG tablet Take 1 tablet (10 mg total) by mouth 2 (two) times daily as needed for muscle spasms. (Patient not taking: Reported on 07/12/2016) 14 tablet 0  . doxycycline (VIBRAMYCIN)  100 MG capsule Take 1 capsule (100 mg total) by mouth 2 (two) times daily. (Patient not taking: Reported on 07/12/2016) 20 capsule 0  . Melatonin-Pyridoxine (MELATIN PO) Take 4 tablets by mouth at bedtime as needed (SLEEP).     No facility-administered medications prior to visit.     ROS Review of Systems  Constitutional: Negative.   Respiratory: Negative.   Cardiovascular: Negative.   Psychiatric/Behavioral: Positive for sleep disturbance. Negative for suicidal ideas. The patient is nervous/anxious.     Objective:  BP 106/69 (BP Location: Left Arm, Patient Position: Sitting, Cuff Size: Normal)   Pulse 73   Temp 98.2 F (36.8 C) (Oral)   Resp 18   Ht 5\' 3"  (1.6 m)   Wt 169 lb 9.6 oz (76.9 kg)   SpO2 99%   BMI 30.04 kg/m   BP/Weight 07/12/2016 12/31/2015 12/30/2015  Systolic BP 106 120 130  Diastolic BP 69 70 94  Wt. (Lbs) 169.6 183 175  BMI 30.04 33.47 32.01     Physical Exam  Constitutional: She appears well-developed and well-nourished.  Cardiovascular: Normal rate, regular rhythm, normal heart sounds and intact distal pulses.   Pulmonary/Chest: Effort normal and breath sounds normal.  Skin: Skin is warm and dry.  Psychiatric: Her speech is normal. Her mood appears anxious. She expresses no homicidal and no suicidal ideation. She expresses no suicidal plans and no homicidal plans. She is attentive.  Nursing note and vitals reviewed.  Assessment & Plan:   Problem List Items Addressed This Visit       Other Visit Diagnoses    Generalized anxiety disorder    -  Primary   LCSW spoke with patient and provided resources  Relevant Medications   sertraline (ZOLOFT) 25 MG tablet   Social anxiety disorder       Relevant Medications   sertraline (ZOLOFT) 25 MG tablet   Insomnia due to other mental disorder       Relevant Medications   traZODone (DESYREL) 50 MG tablet   Screening cholesterol level       Relevant Orders   Lipid Panel   Healthcare maintenance        Relevant Orders   HIV antibody (with reflex)      Meds ordered this encounter  Medications  . sertraline (ZOLOFT) 25 MG tablet    Sig: Take 1 tablet (25 mg total) by mouth daily.    Dispense:  30 tablet    Refill:  1    Order Specific Question:   Supervising Provider    Answer:   Quentin Angst [id number]  . traZODone (DESYREL) 50 MG tablet    Sig: Take 1 tablet (50 mg total) by mouth at bedtime as needed for sleep.    Dispense:  30 tablet    Refill:  1    Order Specific Question:   Supervising Provider    Answer:   Quentin Angst [id number]    Follow-up: Return in about 8 weeks (around 09/06/2016) for GAD.   Lizbeth Bark FNP

## 2016-07-12 NOTE — Progress Notes (Signed)
Patient is here for establish care  Patient decline tdap shot for today   Patient already has her PAP scheduled @ her [facility]

## 2016-07-12 NOTE — Patient Instructions (Addendum)
Generalized Anxiety Disorder, Adult Generalized anxiety disorder (GAD) is a mental health disorder. People with this condition constantly worry about everyday events. Unlike normal anxiety, worry related to GAD is not triggered by a specific event. These worries also do not fade or get better with time. GAD interferes with life functions, including relationships, work, and school. GAD can vary from mild to severe. People with severe GAD can have intense waves of anxiety with physical symptoms (panic attacks). What are the causes? The exact cause of GAD is not known. What increases the risk? This condition is more likely to develop in:  Women.  People who have a family history of anxiety disorders.  People who are very shy.  People who experience very stressful life events, such as the death of a loved one.  People who have a very stressful family environment.  What are the signs or symptoms? People with GAD often worry excessively about many things in their lives, such as their health and family. They may also be overly concerned about:  Doing well at work.  Being on time.  Natural disasters.  Friendships.  Physical symptoms of GAD include:  Fatigue.  Muscle tension or having muscle twitches.  Trembling or feeling shaky.  Being easily startled.  Feeling like your heart is pounding or racing.  Feeling out of breath or like you cannot take a deep breath.  Having trouble falling asleep or staying asleep.  Sweating.  Nausea, diarrhea, or irritable bowel syndrome (IBS).  Headaches.  Trouble concentrating or remembering facts.  Restlessness.  Irritability.  How is this diagnosed? Your health care provider can diagnose GAD based on your symptoms and medical history. You will also have a physical exam. The health care provider will ask specific questions about your symptoms, including how severe they are, when they started, and if they come and go. Your health care  provider may ask you about your use of alcohol or drugs, including prescription medicines. Your health care provider may refer you to a mental health specialist for further evaluation. Your health care provider will do a thorough examination and may perform additional tests to rule out other possible causes of your symptoms. To be diagnosed with GAD, a person must have anxiety that:  Is out of his or her control.  Affects several different aspects of his or her life, such as work and relationships.  Causes distress that makes him or her unable to take part in normal activities.  Includes at least three physical symptoms of GAD, such as restlessness, fatigue, trouble concentrating, irritability, muscle tension, or sleep problems.  Before your health care provider can confirm a diagnosis of GAD, these symptoms must be present more days than they are not, and they must last for six months or longer. How is this treated? The following therapies are usually used to treat GAD:  Medicine. Antidepressant medicine is usually prescribed for long-term daily control. Antianxiety medicines may be added in severe cases, especially when panic attacks occur.  Talk therapy (psychotherapy). Certain types of talk therapy can be helpful in treating GAD by providing support, education, and guidance. Options include: ? Cognitive behavioral therapy (CBT). People learn coping skills and techniques to ease their anxiety. They learn to identify unrealistic or negative thoughts and behaviors and to replace them with positive ones. ? Acceptance and commitment therapy (ACT). This treatment teaches people how to be mindful as a way to cope with unwanted thoughts and feelings. ? Biofeedback. This process trains you to   manage your body's response (physiological response) through breathing techniques and relaxation methods. You will work with a therapist while machines are used to monitor your physical symptoms.  Stress  management techniques. These include yoga, meditation, and exercise.  A mental health specialist can help determine which treatment is best for you. Some people see improvement with one type of therapy. However, other people require a combination of therapies. Follow these instructions at home:  Take over-the-counter and prescription medicines only as told by your health care provider.  Try to maintain a normal routine.  Try to anticipate stressful situations and allow extra time to manage them.  Practice any stress management or self-calming techniques as taught by your health care provider.  Do not punish yourself for setbacks or for not making progress.  Try to recognize your accomplishments, even if they are small.  Keep all follow-up visits as told by your health care provider. This is important. Contact a health care provider if:  Your symptoms do not get better.  Your symptoms get worse.  You have signs of depression, such as: ? A persistently sad, cranky, or irritable mood. ? Loss of enjoyment in activities that used to bring you joy. ? Change in weight or eating. ? Changes in sleeping habits. ? Avoiding friends or family members. ? Loss of energy for normal tasks. ? Feelings of guilt or worthlessness. Get help right away if:  You have serious thoughts about hurting yourself or others. If you ever feel like you may hurt yourself or others, or have thoughts about taking your own life, get help right away. You can go to your nearest emergency department or call:  Your local emergency services (911 in the [place]  A suicide crisis helpline, such as the [facility] at 5597427213. This is open 24 hours a day.  Summary  Generalized anxiety disorder (GAD) is a mental health disorder that involves worry that is not triggered by a specific event.  People with GAD often worry excessively about many things in their lives, such as their health and  family.  GAD may cause physical symptoms such as restlessness, trouble concentrating, sleep problems, frequent sweating, nausea, diarrhea, headaches, and trembling or muscle twitching.  A mental health specialist can help determine which treatment is best for you. Some people see improvement with one type of therapy. However, other people require a combination of therapies. This information is not intended to replace advice given to you by your health care provider. Make sure you discuss any questions you have with your health care provider. Document Released: 05/05/2012 Document Revised: 11/29/2015 Document Reviewed: 11/29/2015 Elsevier Interactive Patient Education  2018 [employer].  Sertraline tablets What is this medicine? SERTRALINE (SER tra leen) is used to treat depression. It may also be used to treat obsessive compulsive disorder, panic disorder, post-trauma stress, premenstrual dysphoric disorder (PMDD) or social anxiety. This medicine may be used for other purposes; ask your health care provider or pharmacist if you have questions. COMMON BRAND NAME(S): Zoloft What should I tell my health care provider before I take this medicine? They need to know if you have any of these conditions: -bleeding disorders -bipolar disorder or a family history of bipolar disorder -glaucoma -heart disease -high blood pressure -history of irregular heartbeat -history of low levels of calcium, magnesium, or potassium in the blood -if you often drink alcohol -liver disease -receiving electroconvulsive therapy -seizures -suicidal thoughts, plans, or attempt; a previous suicide attempt by you or a family  member -take medicines that treat or prevent blood clots -thyroid disease -an unusual or allergic reaction to sertraline, other medicines, foods, dyes, or preservatives -pregnant or trying to get pregnant -breast-feeding How should I use this medicine? Take this medicine by mouth with a glass  of water. Follow the directions on the prescription label. You can take it with or without food. Take your medicine at regular intervals. Do not take your medicine more often than directed. Do not stop taking this medicine suddenly except upon the advice of your doctor. Stopping this medicine too quickly may cause serious side effects or your condition may worsen. A special MedGuide will be given to you by the pharmacist with each prescription and refill. Be sure to read this information carefully each time. Talk to your pediatrician regarding the use of this medicine in children. While this drug may be prescribed for children as young as 7 years for selected conditions, precautions do apply. Overdosage: If you think you have taken too much of this medicine contact a poison control center or emergency room at once. NOTE: This medicine is only for you. Do not share this medicine with others. What if I miss a dose? If you miss a dose, take it as soon as you can. If it is almost time for your next dose, take only that dose. Do not take double or extra doses. What may interact with this medicine? Do not take this medicine with any of the following medications: -cisapride -dofetilide -dronedarone -linezolid -MAOIs like Carbex, Eldepryl, Marplan, Nardil, and Parnate -methylene blue (injected into a vein) -pimozide -thioridazine This medicine may also interact with the following medications: -alcohol -amphetamines -aspirin and aspirin-like medicines -certain medicines for depression, anxiety, or psychotic disturbances -certain medicines for fungal infections like ketoconazole, fluconazole, posaconazole, and itraconazole -certain medicines for irregular heart beat like flecainide, quinidine, propafenone -certain medicines for migraine headaches like almotriptan, eletriptan, frovatriptan, naratriptan, rizatriptan, sumatriptan, zolmitriptan -certain medicines for sleep -certain medicines for seizures  like carbamazepine, valproic acid, phenytoin -certain medicines that treat or prevent blood clots like warfarin, enoxaparin, dalteparin -cimetidine -digoxin -diuretics -fentanyl -isoniazid -lithium -NSAIDs, medicines for pain and inflammation, like ibuprofen or naproxen -other medicines that prolong the QT interval (cause an abnormal heart rhythm) -rasagiline -safinamide -supplements like St. John's wort, kava kava, valerian -tolbutamide -tramadol -tryptophan This list may not describe all possible interactions. Give your health care provider a list of all the medicines, herbs, non-prescription drugs, or dietary supplements you use. Also tell them if you smoke, drink alcohol, or use illegal drugs. Some items may interact with your medicine. What should I watch for while using this medicine? Tell your doctor if your symptoms do not get better or if they get worse. Visit your doctor or health care professional for regular checks on your progress. Because it may take several weeks to see the full effects of this medicine, it is important to continue your treatment as prescribed by your doctor. Patients and their families should watch out for new or worsening thoughts of suicide or depression. Also watch out for sudden changes in feelings such as feeling anxious, agitated, panicky, irritable, hostile, aggressive, impulsive, severely restless, overly excited and hyperactive, or not being able to sleep. If this happens, especially at the beginning of treatment or after a change in dose, call your health care professional. Dennis Bast may get drowsy or dizzy. Do not drive, use machinery, or do anything that needs mental alertness until you know how this medicine affects you. Do  not stand or sit up quickly, especially if you are an older patient. This reduces the risk of dizzy or fainting spells. Alcohol may interfere with the effect of this medicine. Avoid alcoholic drinks. Your mouth may get dry. Chewing  sugarless gum or sucking hard candy, and drinking plenty of water may help. Contact your doctor if the problem does not go away or is severe. What side effects may I notice from receiving this medicine? Side effects that you should report to your doctor or health care professional as soon as possible: -allergic reactions like skin rash, itching or hives, swelling of the face, lips, or tongue -anxious -black, tarry stools -changes in vision -confusion -elevated mood, decreased need for sleep, racing thoughts, impulsive behavior -eye pain -fast, irregular heartbeat -feeling faint or lightheaded, falls -feeling agitated, angry, or irritable -hallucination, loss of contact with reality -loss of balance or coordination -loss of memory -painful or prolonged erections -restlessness, pacing, inability to keep still -seizures -stiff muscles -suicidal thoughts or other mood changes -trouble sleeping -unusual bleeding or bruising -unusually weak or tired -vomiting Side effects that usually do not require medical attention (report to your doctor or health care professional if they continue or are bothersome): -change in appetite or weight -change in sex drive or performance -diarrhea -increased sweating -indigestion, nausea -tremors This list may not describe all possible side effects. Call your doctor for medical advice about side effects. You may report side effects to FDA at 1-800-FDA-1088. Where should I keep my medicine? Keep out of the reach of children. Store at room temperature between 15 and 30 degrees C (59 and 86 degrees F). Throw away any unused medicine after the expiration date. NOTE: This sheet is a summary. It may not cover all possible information. If you have questions about this medicine, talk to your doctor, pharmacist, or health care provider.  2018 Elsevier/Gold Standard (2016-01-13 14:17:49)

## 2016-07-13 ENCOUNTER — Telehealth: Payer: Self-pay | Admitting: Family Medicine

## 2016-07-13 LAB — LIPID PANEL
Chol/HDL Ratio: 3.4 ratio (ref 0.0–4.4)
Cholesterol, Total: 207 mg/dL — ABNORMAL HIGH (ref 100–199)
HDL: 61 mg/dL (ref >39)
LDL Calculated: 137 mg/dL — ABNORMAL HIGH (ref 0–99)
Triglycerides: 43 mg/dL (ref 0–149)
VLDL CHOLESTEROL CAL: 9 mg/dL (ref 5–40)

## 2016-07-13 LAB — HIV ANTIBODY (ROUTINE TESTING W REFLEX): HIV SCREEN 4TH GENERATION: NONREACTIVE

## 2016-07-13 NOTE — Telephone Encounter (Signed)
Pt. Came to facility to drop off a letter to her PCP. Letter will be placed at providers box. Please f/u with pt.

## 2016-07-13 NOTE — BH Specialist Note (Signed)
Integrated Behavioral Health Initial Visit  MRN: [id number] Name: Abigail Lewis   Session Start time: 8:55 AM Session End time: 9:20 Total time: 25  Type of Service: Integrated Behavioral Health- Individual/Family Interpretor:No. Interpretor Name and Language: N/A   Warm Hand Off Completed.       SUBJECTIVE: Abigail Lewis is a 26 y.o. female accompanied by patient. Patient was referred by FNP [place]on for depression and anxiety. Patient reports the following symptoms/concerns: overwhelming feelings of sadness and worry, difficulty sleeping, low motivation, difficulty focusing, and irritability Duration of problem: Couple of years; Severity of problem: moderate  OBJECTIVE: Mood: Anxious and Affect: Appropriate Risk of harm to self or others: No plan to harm self or others   LIFE CONTEXT: Family and Social: Pt receives family support. Her sister and brother in law resides in same neighborhood. Pt's mother and brother resides in [place] while her father in [place] School/Work: Pt has two part time jobs (CNA, Forensic scientist) and is in school to to obtain degree in Educational psychologist. She has private insurance and does not receive public benefits Self-Care: Pt participates in medication management. She likes to write and talks with sister about stressors Life Changes: Pt recently purchased a house and is experiencing buyer's remorse.  GOALS ADDRESSED: Patient will reduce symptoms of: anxiety and depression and increase knowledge and/or ability of: coping skills and also: Increase adequate support systems for patient/family   INTERVENTIONS: Mindfulness or Relaxation Training, Supportive Counseling, Psychoeducation and/or Health Education and Link to [facility]  Standardized Assessments completed: PHQ 2&9  ASSESSMENT: Patient currently experiencing depression and anxiety. She reports overwhelming feelings of sadness and worry, difficulty sleeping, low motivation, difficulty focusing,  and irritability. Pt's family has hx of mental health and substance use disorders. She receives support from sister who resides nearby. Patient may benefit from psychoeducation, psychotherapy, and medication management. LCSWA educated pt on the cycle of anxiety and depression. LCSWA discussed benefits of applying healthy coping skills to manage symptoms. Pt was successful in identifying healthy strategies to use on a daily basis, including participation in medication management through PCP. Pt was provided resources for crisis intervention, psychotherapy, and emotional well-being apps.  PLAN: 1. Follow up with behavioral health clinician on : Pt was encouraged to contact LCSWA if symptoms worsen or fail to improve to schedule behavioral appointments at [facility]. 2. Behavioral recommendations: LCSWA recommends that pt apply healthy coping skills discussed, comply with medication management, and utilize provided resources. Pt is encouraged to schedule follow up appointment with LCSWA 3. Referral(s): Paramedic (LME/Outside Clinic) and relaxation apps 4. "From scale of 1-10, how likely are you to follow plan?": 8/10  Bridgett Larsson, LCSW 07/13/16 11:56 AM

## 2016-07-18 ENCOUNTER — Telehealth: Payer: Self-pay | Admitting: Family Medicine

## 2016-07-18 NOTE — Telephone Encounter (Signed)
Letter request received. Please inform patient of policy regarding paperwork requests. Once letter available she will be called.

## 2016-07-18 NOTE — Telephone Encounter (Signed)
PT called to request a letter saying that she need to leave work early since the medication she is taking is making drowsing   And can work or drive home, please let the PT know if you can or can't do this for her, please advice?

## 2016-07-18 NOTE — Telephone Encounter (Signed)
PT called to request a letter saying that she need to leave work early since the medication she is taking is making drowsing   And can work or drive home, please let the PT know if you can or can't do this for her, please follow up with PT

## 2016-07-19 ENCOUNTER — Other Ambulatory Visit: Payer: Self-pay | Admitting: Family Medicine

## 2016-07-19 DIAGNOSIS — E782 Mixed hyperlipidemia: Secondary | ICD-10-CM

## 2016-07-19 MED ORDER — PRAVASTATIN SODIUM 10 MG PO TABS: 10.0000 mg | ORAL_TABLET | Freq: Every day | ORAL | 2 refills | Status: DC

## 2016-07-19 NOTE — Telephone Encounter (Signed)
CMA call regarding lab results   Patient Verify DOB   Patient was aware and understood  

## 2016-07-19 NOTE — Telephone Encounter (Signed)
-----   Message from Lizbeth Bark, FNP sent at 07/19/2016  8:28 AM EDT ----- Lipid levels were elevated. This can increase your risk of heart disease overtime. You will be prescribed pravastatin. Start eating a diet low in saturated fat. Limit your intake of fried foods, red meats, and whole milk. Increase physical activity. Recommend follow up in 3 months.

## 2016-07-19 NOTE — Telephone Encounter (Signed)
CMA call regarding letter request   Patient was aware and understood

## 2016-07-19 NOTE — Telephone Encounter (Signed)
-----   Message from Lizbeth Bark, FNP sent at 07/19/2016  8:28 AM EDT ----- HIV negative. Lipid levels were elevated. This can increase your risk of heart disease overtime. You will be prescribed pravastatin. Start eating a diet low in saturated fat. Limit your intake of fried foods, red meats, and whole milk. Increase physical activity. Recommend follow up in 3 months.

## 2016-07-20 ENCOUNTER — Other Ambulatory Visit: Payer: Self-pay | Admitting: Family Medicine

## 2016-07-23 ENCOUNTER — Other Ambulatory Visit: Payer: Self-pay | Admitting: Family Medicine

## 2016-08-31 ENCOUNTER — Ambulatory Visit: Payer: 59 | Admitting: Family Medicine

## 2016-09-03 ENCOUNTER — Ambulatory Visit: Payer: 59 | Admitting: Family Medicine

## 2016-09-26 ENCOUNTER — Ambulatory Visit: Payer: 59 | Admitting: Family Medicine

## 2016-09-27 ENCOUNTER — Other Ambulatory Visit: Payer: Self-pay | Admitting: Family Medicine

## 2016-09-27 DIAGNOSIS — F5105 Insomnia due to other mental disorder: Secondary | ICD-10-CM

## 2016-09-27 DIAGNOSIS — F99 Mental disorder, not otherwise specified: Principal | ICD-10-CM

## 2016-10-03 ENCOUNTER — Other Ambulatory Visit: Payer: Self-pay | Admitting: Family Medicine

## 2016-10-03 DIAGNOSIS — E782 Mixed hyperlipidemia: Secondary | ICD-10-CM

## 2016-10-27 ENCOUNTER — Other Ambulatory Visit: Payer: Self-pay | Admitting: Family Medicine

## 2016-10-27 DIAGNOSIS — E782 Mixed hyperlipidemia: Secondary | ICD-10-CM

## 2016-12-05 ENCOUNTER — Other Ambulatory Visit: Payer: Self-pay | Admitting: Family Medicine

## 2016-12-05 DIAGNOSIS — F411 Generalized anxiety disorder: Secondary | ICD-10-CM

## 2016-12-05 DIAGNOSIS — F4010 Social phobia, unspecified: Secondary | ICD-10-CM

## 2016-12-10 ENCOUNTER — Other Ambulatory Visit: Payer: Self-pay | Admitting: Family Medicine

## 2016-12-10 DIAGNOSIS — F99 Mental disorder, not otherwise specified: Principal | ICD-10-CM

## 2016-12-10 DIAGNOSIS — F5105 Insomnia due to other mental disorder: Secondary | ICD-10-CM

## 2016-12-12 NOTE — Telephone Encounter (Signed)
CMA call regarding letting patient know about their refill and that they needed an office visit for additional visit   Patient was aware and understood

## 2017-04-11 ENCOUNTER — Emergency Department (HOSPITAL_COMMUNITY)
Admission: EM | Admit: 2017-04-11 | Discharge: 2017-04-11 | Disposition: A | Discharge status: Home / Self Care | Payer: 59 | Attending: Emergency Medicine | Admitting: Emergency Medicine

## 2017-04-11 ENCOUNTER — Other Ambulatory Visit: Payer: Self-pay

## 2017-04-11 ENCOUNTER — Encounter (HOSPITAL_COMMUNITY): Payer: Self-pay | Admitting: *Deleted

## 2017-04-11 ENCOUNTER — Emergency Department (HOSPITAL_COMMUNITY): Payer: 59

## 2017-04-11 DIAGNOSIS — M545 Low back pain: Secondary | ICD-10-CM | POA: Diagnosis present

## 2017-04-11 DIAGNOSIS — Y999 Unspecified external cause status: Secondary | ICD-10-CM | POA: Diagnosis not present

## 2017-04-11 DIAGNOSIS — Y929 Unspecified place or not applicable: Secondary | ICD-10-CM | POA: Diagnosis not present

## 2017-04-11 DIAGNOSIS — Z7902 Long term (current) use of antithrombotics/antiplatelets: Secondary | ICD-10-CM | POA: Diagnosis not present

## 2017-04-11 DIAGNOSIS — Y939 Activity, unspecified: Secondary | ICD-10-CM | POA: Diagnosis not present
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
## 2017-04-11 MED ORDER — CYCLOBENZAPRINE HCL 10 MG PO TABS: 10.0000 mg | ORAL_TABLET | Freq: Two times a day (BID) | ORAL | 0 refills | Status: DC | PRN

## 2017-04-11 MED ORDER — NAPROXEN 500 MG PO TABS: 500.0000 mg | ORAL_TABLET | Freq: Two times a day (BID) | ORAL | 0 refills | Status: DC
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
## 2017-04-11 NOTE — Discharge Instructions (Signed)
Your chest x-ray was negative today. You will likely experience soreness for the next few days.  Please take the prescribed medications as directed. Apply heating pad and stretch areas as tolerated. Return to ED for worsening symptoms, trouble breathing or trouble swallowing, numbness in your legs, loss of your bowel or bladder function, vision changes or additional injuries.

## 2017-04-11 NOTE — ED Notes (Signed)
Pt changed into wine colored scrubs. Pt belongings inventories and security called for wanding.

## 2017-07-15 ENCOUNTER — Encounter: Payer: Self-pay | Admitting: Neurology

## 2017-10-18 ENCOUNTER — Ambulatory Visit: Payer: 59 | Admitting: Neurology

## 2017-10-18 ENCOUNTER — Encounter

## 2018-01-13 ENCOUNTER — Encounter: Payer: Self-pay | Admitting: Neurology

## 2018-01-13 ENCOUNTER — Ambulatory Visit: Payer: 59 | Admitting: Neurology

## 2018-01-13 VITALS — BP 116/66 | HR 88 | Ht 63.0 in | Wt 158.0 lb

## 2018-01-13 DIAGNOSIS — R202 Paresthesia of skin: Secondary | ICD-10-CM

## 2018-01-13 NOTE — Patient Instructions (Signed)
NCS/EMG of the left leg in February  ELECTROMYOGRAM AND NERVE CONDUCTION STUDIES (EMG/NCS) INSTRUCTIONS  How to Prepare The neurologist conducting the EMG will need to know if you have certain medical conditions. Tell the neurologist and other EMG lab personnel if you: . Have a pacemaker or any other electrical medical device . Take blood-thinning medications . Have hemophilia, a blood-clotting disorder that causes prolonged bleeding Bathing Take a shower or bath shortly before your exam in order to remove oils from your skin. Don't apply lotions or creams before the exam.  What to Expect You'll likely be asked to change into a hospital gown for the procedure and lie down on an examination table. The following explanations can help you understand what will happen during the exam.  . Electrodes. The neurologist or a technician places surface electrodes at various locations on your skin depending on where you're experiencing symptoms. Or the neurologist may insert needle electrodes at different sites depending on your symptoms.  . Sensations. The electrodes will at times transmit a tiny electrical current that you may feel as a twinge or spasm. The needle electrode may cause discomfort or pain that usually ends shortly after the needle is removed. If you are concerned about discomfort or pain, you may want to talk to the neurologist about taking a short break during the exam.  . Instructions. During the needle EMG, the neurologist will assess whether there is any spontaneous electrical activity when the muscle is at rest - activity that isn't present in healthy muscle tissue - and the degree of activity when you slightly contract the muscle.  He or she will give you instructions on resting and contracting a muscle at appropriate times. Depending on what muscles and nerves the neurologist is examining, he or she may ask you to change positions during the exam.  After your EMG You may experience some  temporary, minor bruising where the needle electrode was inserted into your muscle. This bruising should fade within several days. If it persists, contact your primary care doctor.

## 2018-01-13 NOTE — Progress Notes (Signed)
[facility] Neurology Division Clinic Note - Initial Visit   Date: 01/13/18  Abigail Lewis MRN: [id number] DOB: 1990-12-07   Dear Cletis Media, PA:  Thank you for your kind referral of Jules Schick for consultation of paresthesias. Although her history is well known to you, please allow [place] to reiterate it for the purpose of our medical record. The patient was accompanied to the clinic by self.   History of Present Illness: Abigail Lewis is a 27 y.o. right-handed [place] American female with anxiety/depression presenting for evaluation of numbness/tingling of the hands and feet.    Starting in 2018, she began having intermittent spells of tingling and numbness over the anterior left leg, which lasts 2 minutes.  It can be triggered if she walks a faster pace and rest alleviates her symptoms. She does not have weakness or leg pain.  In early 2018, she started having numbness/tingling of the fingertips and forearms, lasting 2 minutes.  Anxiety can trigger her hand symptoms.  She has not noticed this in her arm in the past several months.   Out-side paper records, electronic medical record, and images have been reviewed where available and summarized as:  Labs 2019:  Folate 7.3, vitamin B12 553, hepatitis panel neg, Vitamin D 11*, HbA1c 5.2, HIV neg, TSH 0.614   Past Medical History:  Diagnosis Date  . Medical history non-contributory     Past Surgical History:  Procedure Laterality Date  . NO PAST SURGERIES       Medications:  Outpatient Encounter Medications as of 01/13/2018  Medication Sig  . acetaminophen (TYLENOL) 500 MG tablet Take 2 tablets (1,000 mg total) by mouth every 8 (eight) hours as needed for moderate pain.  [name] Kitchen sertraline (ZOLOFT) 25 MG tablet TAKE 1 TABLET BY MOUTH EVERY DAY  . traZODone (DESYREL) 50 MG tablet TAKE 1 TABLET BY MOUTH AT BEDTIME AS NEEDED FOR SLEEP  . [DISCONTINUED] cyclobenzaprine (FLEXERIL) 10 MG tablet Take 1 tablet (10 mg  total) by mouth 2 (two) times daily as needed for muscle spasms.  . [DISCONTINUED] doxycycline (VIBRAMYCIN) 100 MG capsule Take 1 capsule (100 mg total) by mouth 2 (two) times daily. (Patient not taking: Reported on 07/12/2016)  . [DISCONTINUED] Melatonin-Pyridoxine (MELATIN PO) Take 4 tablets by mouth at bedtime as needed (SLEEP).  . [DISCONTINUED] naproxen (NAPROSYN) 500 MG tablet Take 1 tablet (500 mg total) by mouth 2 (two) times daily.  . [DISCONTINUED] pravastatin (PRAVACHOL) 10 MG tablet TAKE 1 TABLET BY MOUTH DAILY   No facility-administered encounter medications on file as of 01/13/2018.      Allergies:  Allergies  Allergen Reactions  . Penicillins Swelling    Family History: Family History  Problem Relation Age of Onset  . Stroke Mother   . Diabetes Father   . Healthy Sister   . Autism Brother     Social History: Social History   Tobacco Use  . Smoking status: Never Smoker  . Smokeless tobacco: Never Used  Substance Use Topics  . Alcohol use: No  . Drug use: No   Social History   Social History Narrative  . Not on file    Review of Systems:  CONSTITUTIONAL: No fevers, chills, night sweats, or weight loss.   EYES: No visual changes or eye pain ENT: No hearing changes.  No history of nose bleeds.   RESPIRATORY: No cough, wheezing and shortness of breath.   CARDIOVASCULAR: Negative for chest pain, and palpitations.   GI: Negative for abdominal discomfort,  blood in stools or black stools.  No recent change in bowel habits.   GU:  No history of incontinence.   MUSCLOSKELETAL: No history of joint pain or swelling.  No myalgias.   SKIN: Negative for lesions, rash, and itching.   HEMATOLOGY/ONCOLOGY: Negative for prolonged bleeding, bruising easily, and swollen nodes.  No history of cancer.   ENDOCRINE: Negative for cold or heat intolerance, polydipsia or goiter.   PSYCH:  +depression or anxiety symptoms.   NEURO: As Above.   Vital Signs:  BP 116/66   Pulse  88   Ht 5\' 3"  (1.6 m)   Wt 158 lb (71.7 kg)   SpO2 94%   BMI 27.99 kg/m    General Medical Exam:   General:  Well appearing, comfortable.   Eyes/ENT: see cranial nerve examination.   Neck: No masses appreciated.  Full range of motion without tenderness.  No carotid bruits. Respiratory:  Clear to auscultation, good air entry bilaterally.   Cardiac:  Regular rate and rhythm, no murmur.   Extremities:  No deformities, edema, or skin discoloration.  Skin:  No rashes or lesions.  Neurological Exam: MENTAL STATUS including orientation to time, place, person, recent and remote memory, attention span and concentration, language, and fund of knowledge is normal.  Speech is not dysarthric.  CRANIAL NERVES: II:  No visual field defects.  Unremarkable fundi.   III-IV-VI: Pupils equal round and reactive to light.  Normal conjugate, extra-ocular eye movements in all directions of gaze.  No nystagmus.  No ptosis.   V:  Normal facial sensation.    VII:  Normal facial symmetry and movements.  VIII:  Normal hearing and vestibular function.   IX-X:  Normal palatal movement.   XI:  Normal shoulder shrug and head rotation.   XII:  Normal tongue strength and range of motion, no deviation or fasciculation.  MOTOR:  No atrophy, fasciculations or abnormal movements.  No pronator drift.  Tone is normal.    Right Upper Extremity:    Left Upper Extremity:    Deltoid  5/5   Deltoid  5/5   Biceps  5/5   Biceps  5/5   Triceps  5/5   Triceps  5/5   Wrist extensors  5/5   Wrist extensors  5/5   Wrist flexors  5/5   Wrist flexors  5/5   Finger extensors  5/5   Finger extensors  5/5   Finger flexors  5/5   Finger flexors  5/5   Dorsal interossei  5/5   Dorsal interossei  5/5   Abductor pollicis  5/5   Abductor pollicis  5/5   Tone (Ashworth scale)  0  Tone (Ashworth scale)  0   Right Lower Extremity:    Left Lower Extremity:    Hip flexors  5/5   Hip flexors  5/5   Hip extensors  5/5   Hip extensors  5/5     Knee flexors  5/5   Knee flexors  5/5   Knee extensors  5/5   Knee extensors  5/5   Dorsiflexors  5/5   Dorsiflexors  5/5   Plantarflexors  5/5   Plantarflexors  5/5   Toe extensors  5/5   Toe extensors  5/5   Toe flexors  5/5   Toe flexors  5/5   Tone (Ashworth scale)  0  Tone (Ashworth scale)  0   MSRs:  Right  Left brachioradialis 2+  brachioradialis 2+  biceps 2+  biceps 2+  triceps 2+  triceps 2+  patellar 2+  patellar 2+  ankle jerk 2+  ankle jerk 2+  Hoffman no  Hoffman no  plantar response down  plantar response down   SENSORY:  Normal and symmetric perception of light touch, pinprick, vibration, and proprioception.  COORDINATION/GAIT: Normal finger-to- nose-finger and heel-to-shin.  Intact rapid alternating movements bilaterally. Gait narrow based and stable. Tandem and stressed gait intact.    IMPRESSION: Nonspecific left leg paresthesias.  Normal neurological exam makes peripheral and central nervous system pathology very low.  Reassurance provided that I do not see any evidence that symptoms are due to worrisome condition.  Her hand paresthesias are provoked by stress and it is likely that these symptoms are a manifestation of anxiety.  NCS/EMG of the left leg will be ordered to exclude neuropathy, given that these symptoms have not resolved.     Thank you for allowing me to participate in patient's care.  If I can answer any additional questions, I would be pleased to do so.    Sincerely,    Kenda Kloehn K. Allena KatzPatel, DO

## 2018-03-06 ENCOUNTER — Encounter: Payer: 59 | Admitting: Neurology

## 2018-06-03 ENCOUNTER — Encounter: Payer: 59 | Admitting: Neurology

## 2018-07-08 ENCOUNTER — Encounter: Payer: 59 | Admitting: Neurology

## 2018-08-05 ENCOUNTER — Encounter: Payer: 59 | Admitting: Neurology

## 2018-09-18 ENCOUNTER — Encounter: Payer: 59 | Admitting: Neurology

## 2018-10-23 ENCOUNTER — Other Ambulatory Visit: Payer: Self-pay

## 2018-10-23 ENCOUNTER — Ambulatory Visit (INDEPENDENT_AMBULATORY_CARE_PROVIDER_SITE_OTHER): Payer: 59 | Admitting: Neurology

## 2018-10-23 DIAGNOSIS — R202 Paresthesia of skin: Secondary | ICD-10-CM | POA: Diagnosis not present

## 2018-10-23 NOTE — Procedures (Signed)
[facility]  [address]  [place], [place] [address] Tel: [phone] Fax:  [phone] Test Date:  10/23/2018  Patient: Abigail Lewis DOB: Aug 26, 1990 Physician: Narda Amber, DO  Sex: Female Height: 5\' 3"  Ref Phys: Narda Amber, DO  ID#: [id number] Temp: 32.0C Technician:    Patient Complaints: This is a 28 year old female referred for evaluation of left leg numbness and tingling.  NCV & EMG Findings: Extensive electrodiagnostic testing of the left lower extremity shows:  1. Left sural and superficial peroneal sensory responses are within normal limits. 2. Left peroneal and tibial motor responses are within normal limits. 3. Left tibial H reflex study is within normal limits.   4. There is no evidence of active or chronic motor axonal loss changes affecting any of the tested muscles.  Motor unit configuration and recruitment pattern is within normal limits.  Impression: This is a normal study of the left lower extremity.  In particular, there is no evidence of a sensorimotor polyneuropathy or lumbosacral radiculopathy.   ___________________________ Narda Amber, DO    Nerve Conduction Studies Anti Sensory Summary Table   Site NR Peak (ms) Norm Peak (ms) P-T Amp (V) Norm P-T Amp  Left Sup Peroneal Anti Sensory (Ant Lat Mall)  32C  12 cm    2.1 <4.5 29.6 >5  Left Sural Anti Sensory (Lat Mall)  32C  Calf    2.1 <4.5 71.6 >5   Motor Summary Table   Site NR Onset (ms) Norm Onset (ms) O-P Amp (mV) Norm O-P Amp Site1 Site2 Delta-0 (ms) Dist (cm) Vel (m/s) Norm Vel (m/s)  Left Peroneal Motor (Ext Dig Brev)  32C  Ankle    3.0 <5.5 6.8 >3 B Fib Ankle 7.1 36.0 51 >40  B Fib    10.1  5.3  Poplt B Fib 1.3 7.0 54 >40  Poplt    11.4  5.2         Left Tibial Motor (Abd Hall Brev)  32C  Ankle    3.2 <6.0 9.3 >8 Knee Ankle 8.8 41.0 47 >40  Knee    12.0  8.0          H Reflex Studies   NR H-Lat (ms) Lat Norm (ms) L-R H-Lat (ms)  Left Tibial  (Gastroc)  32C     28.16 <35    EMG   Side Muscle Ins Act Fibs Psw Fasc Number Recrt Dur Dur. Amp Amp. Poly Poly. Comment  Left AntTibialis Nml Nml Nml Nml Nml Nml Nml Nml Nml Nml Nml Nml N/A  Left Gastroc Nml Nml Nml Nml Nml Nml Nml Nml Nml Nml Nml Nml N/A  Left Flex Dig Long Nml Nml Nml Nml Nml Nml Nml Nml Nml Nml Nml Nml N/A  Left RectFemoris Nml Nml Nml Nml Nml Nml Nml Nml Nml Nml Nml Nml N/A  Left GluteusMed Nml Nml Nml Nml Nml Nml Nml Nml Nml Nml Nml Nml N/A      Waveforms:

## 2018-10-24 ENCOUNTER — Telehealth: Payer: Self-pay

## 2018-10-24 NOTE — Telephone Encounter (Signed)
Spoke with patient she was made aware of results 

## 2018-10-24 NOTE — Telephone Encounter (Signed)
-----   Message from Alda Berthold, DO sent at 10/24/2018 12:14 PM EDT ----- Please inform patient that her nerve testing is normal.  No evidence of nerve injury, such as neuropathy or nerve impingement in the back.  If she has additional questions, recommend follow-up visit.

## 2018-11-08 ENCOUNTER — Emergency Department (HOSPITAL_COMMUNITY)
Admission: EM | Admit: 2018-11-08 | Discharge: 2018-11-08 | Disposition: A | Discharge status: Home / Self Care | Payer: 59 | Attending: Emergency Medicine | Admitting: Emergency Medicine

## 2018-11-08 ENCOUNTER — Other Ambulatory Visit: Payer: Self-pay

## 2018-11-08 ENCOUNTER — Encounter (HOSPITAL_COMMUNITY): Payer: Self-pay

## 2018-11-08 DIAGNOSIS — R42 Dizziness and giddiness: Secondary | ICD-10-CM | POA: Diagnosis present

## 2018-11-08 DIAGNOSIS — F419 Anxiety disorder, unspecified: Secondary | ICD-10-CM | POA: Diagnosis not present

## 2018-11-08 DIAGNOSIS — Z79899 Other long term (current) drug therapy: Secondary | ICD-10-CM | POA: Diagnosis not present

## 2018-11-08 LAB — CBC WITH DIFFERENTIAL/PLATELET
Abs Immature Granulocytes: 0.02 10*3/uL (ref 0.00–0.07)
Basophils Absolute: 0 10*3/uL (ref 0.0–0.1)
Basophils Relative: 0 %
Eosinophils Absolute: 0 10*3/uL (ref 0.0–0.5)
Eosinophils Relative: 1 %
HCT: 34.7 % — ABNORMAL LOW (ref 36.0–46.0)
Hemoglobin: 12.1 g/dL (ref 12.0–15.0)
Immature Granulocytes: 0 %
Lymphocytes Relative: 27 %
Lymphs Abs: 1.3 10*3/uL (ref 0.7–4.0)
MCH: 32.6 pg (ref 26.0–34.0)
MCHC: 34.9 g/dL (ref 30.0–36.0)
MCV: 93.5 fL (ref 80.0–100.0)
Monocytes Absolute: 0.3 10*3/uL (ref 0.1–1.0)
Monocytes Relative: 6 %
Neutro Abs: 3.2 10*3/uL (ref 1.7–7.7)
Neutrophils Relative %: 66 %
Platelets: 214 10*3/uL (ref 150–400)
RBC: 3.71 MIL/uL — ABNORMAL LOW (ref 3.87–5.11)
RDW: 12.1 % (ref 11.5–15.5)
WBC: 4.9 10*3/uL (ref 4.0–10.5)
nRBC: 0 % (ref 0.0–0.2)

## 2018-11-08 LAB — BASIC METABOLIC PANEL
Anion gap: 10 (ref 5–15)
BUN: 7 mg/dL (ref 6–20)
CO2: 25 mmol/L (ref 22–32)
Calcium: 9.6 mg/dL (ref 8.9–10.3)
Chloride: 103 mmol/L (ref 98–111)
Creatinine, Ser: 0.7 mg/dL (ref 0.44–1.00)
GFR calc Af Amer: >60 mL/min (ref >60)
GFR calc non Af Amer: >60 mL/min (ref >60)
Glucose, Bld: 86 mg/dL (ref 70–99)
Potassium: 3.6 mmol/L (ref 3.5–5.1)
Sodium: 138 mmol/L (ref 135–145)

## 2018-11-08 NOTE — ED Provider Notes (Signed)
[facility] EM[facility]ENC[facility] DEPARTMENT Provider Note   CSN: [id number] Arrival date & time: 11/08/18  1633     History   Chief Complaint Chief Complaint  Patient presents with  . Dizziness  . Anxiety    HPI Abigail Lewis is a 28 y.o. female.     The history is provided by the patient. No language interpreter was used.  Dizziness Quality:  Lightheadedness Severity:  Moderate Onset quality:  Gradual Timing:  Constant Chronicity:  Chronic Relieved by:  Nothing Worsened by:  Nothing Ineffective treatments:  None tried Associated symptoms: no nausea   Risk factors: no anemia and no multiple medications   Anxiety  Pt complains of lightheadedness and tingling in her hands and feet.  Pt reports she saw her OB?gyn this month who told her to drink more fluid.  Pt states she saw neurology and testing was normal.  Pt reports she has not seen her primary care MD.  Pt has stopped her medications.  Pt has a history of vitamin D deficiency   Past Medical History:  Diagnosis Date  . Anxiety   . Depression     Patient Active Problem List   Diagnosis Date Noted  . History of miscarriage 07/12/2016    Past Surgical History:  Procedure Laterality Date  . NO PAST SURGERIES       OB History    Gravida  2   Para      Term      Preterm      AB  1   Living  0     SAB  1   TAB      Ectopic      Multiple      Live Births               Home Medications    Prior to Admission medications   Medication Sig Start Date End Date Taking? Authorizing Provider  acetaminophen (TYLENOL) 500 MG tablet Take 2 tablets (1,000 mg total) by mouth every 8 (eight) hours as needed for moderate pain. 12/30/15   Law, Bea Graff, PA-C  sertraline (ZOLOFT) 25 MG tablet TAKE 1 TABLET BY MOUTH EVERY DAY 12/06/16   Alfonse Spruce, FNP  traZODone (DESYREL) 50 MG tablet TAKE 1 TABLET BY MOUTH AT BEDTIME AS NEEDED FOR SLEEP 12/11/16   Alfonse Spruce, FNP     Family History Family History  Problem Relation Age of Onset  . Stroke Mother   . Diabetes Father   . Healthy Sister   . Autism Brother     Social History Social History   Tobacco Use  . Smoking status: Never Smoker  . Smokeless tobacco: Never Used  Substance Use Topics  . Alcohol use: No  . Drug use: No     Allergies   Penicillins   Review of Systems Review of Systems  Gastrointestinal: Negative for nausea.  Neurological: Positive for dizziness.  All other systems reviewed and are negative.    Physical Exam Updated Vital Signs BP 116/82 (BP Location: Right Arm)   Pulse 74   Temp 98.3 F (36.8 C) (Oral)   Resp 20   SpO2 100%   Physical Exam Vitals signs and nursing note reviewed.  Constitutional:      Appearance: She is well-developed.  HENT:     Head: Normocephalic.     Right Ear: External ear normal.     Left Ear: External ear normal.     Nose: Nose  normal.     Mouth/Throat:     Mouth: Mucous membranes are moist.  Eyes:     Pupils: Pupils are equal, round, and reactive to light.  Neck:     Musculoskeletal: Normal range of motion.  Cardiovascular:     Rate and Rhythm: Normal rate.  Pulmonary:     Effort: Pulmonary effort is normal.  Abdominal:     General: There is no distension.  Musculoskeletal: Normal range of motion.  Skin:    General: Skin is warm.  Neurological:     Mental Status: She is alert and oriented to person, place, and time.  Psychiatric:        Mood and Affect: Mood normal.      ED Treatments / Results  Labs (all labs ordered are listed, but only abnormal results are displayed) Labs Reviewed  CBC WITH DIFFERENTIAL/PLATELET - Abnormal; Notable for the following components:      Result Value   RBC 3.71 (*)    HCT 34.7 (*)    All other components within normal limits  BASIC METABOLIC PANEL    EKG None  Radiology No results found.  Procedures Procedures (including critical care time)  Medications Ordered in  ED Medications - No data to display   Initial Impression / Assessment and Plan / ED Course  I have reviewed the triage vital signs and the nursing notes.  Pertinent labs & imaging results that were available during my care of the patient were reviewed by me and considered in my medical decision making (see chart for details).        Pt encouraged to drink 64 ounces of water a day.  Start taking Vitamin D supplement and see primary care MD for recheck   Final Clinical Impressions(s) / ED Diagnoses   Final diagnoses:  Dizziness    ED Discharge Orders    None    An After Visit Summary was printed and given to the patient.    Elson Areas, [place] 11/08/18 1852    Tegeler, Canary Brim, MD 11/08/18 [phone]

## 2018-11-08 NOTE — ED Notes (Signed)
Patient verbalizes understanding of discharge instructions. Opportunity for questioning and answers were provided. Armband removed by staff, pt discharged from ED.  

## 2018-11-08 NOTE — Discharge Instructions (Signed)
Drink 64 ounces of water a day.  Take a vitamin D supplement Schedule appointment with your primary care MD for evaluation

## 2018-11-08 NOTE — ED Triage Notes (Signed)
Pt from home w/ a c/o dizziness, depression, and dehydration that has been ongoing for ~1 week. No LOC. No N/V/D. She has been out of her Trazodone and sertraline for two weeks. She has been taking these meds since 2018. Additional complaints of tingling in hands and feet. Was seen on 10/23/2018 by OBGYN and was told then she was dehydrated. Has not had water today. Denies pain. No dysuria or hematuria.

## 2018-12-03 DIAGNOSIS — E785 Hyperlipidemia, unspecified: Secondary | ICD-10-CM

## 2018-12-03 HISTORY — DX: Hyperlipidemia, unspecified: E78.5

## 2018-12-12 ENCOUNTER — Ambulatory Visit: Payer: 59 | Admitting: Neurology

## 2019-01-09 ENCOUNTER — Telehealth: Payer: 59 | Admitting: Neurology

## 2019-01-09 ENCOUNTER — Telehealth: Payer: Self-pay | Admitting: Neurology

## 2019-01-09 ENCOUNTER — Other Ambulatory Visit: Payer: Self-pay

## 2019-01-09 NOTE — Telephone Encounter (Signed)
Agree - she was last seen in the office in 2019 and needs to be evaluated as these are new symptoms.  For her vision, the first recommendation is that she get her eyes checked.  She can be set up for VV afterwards.

## 2019-01-09 NOTE — Progress Notes (Signed)
Spoke with patient.  Advised she have a vision screen done and she will follow up next month at her appointment.

## 2019-01-09 NOTE — Telephone Encounter (Signed)
Patient left msg with after hours about being dizzy and wanting to get a cat scan for her seeing spot. Medication from pcp for vertigo is not helping. Thanks!

## 2019-01-09 NOTE — Telephone Encounter (Signed)
I do not see that we see this patient for dizziness or vision issues.  Please advise

## 2019-01-14 ENCOUNTER — Telehealth: Payer: Self-pay | Admitting: Neurology

## 2019-01-14 NOTE — Telephone Encounter (Signed)
Patient called regarding her PCP recommending that she see her Neurologist for a CT Scan? Please Call. Thank you

## 2019-01-14 NOTE — Telephone Encounter (Signed)
Spoke with patient and advised her CT could not be done without an evaluation.  Earlier appointment scheduled.

## 2019-01-14 NOTE — Telephone Encounter (Signed)
As noted in prior telephone encounter dated 12/18.  Patient needs to be evaluated prior to any testing - she was last seen in 2019 for unrelated complaints.  She has an appointment in January.  If she cannot wait until then, she may talk to her PCP who can order tested as they see is necessary.

## 2019-01-30 ENCOUNTER — Telehealth: Payer: 59 | Admitting: Neurology

## 2019-02-09 ENCOUNTER — Telehealth: Payer: 59 | Admitting: Neurology

## 2019-02-13 DIAGNOSIS — R768 Other specified abnormal immunological findings in serum: Secondary | ICD-10-CM

## 2019-02-13 HISTORY — DX: Other specified abnormal immunological findings in serum: R76.8

## 2019-03-07 ENCOUNTER — Other Ambulatory Visit: Payer: Self-pay

## 2019-03-07 ENCOUNTER — Emergency Department (HOSPITAL_COMMUNITY): Payer: 59

## 2019-03-07 ENCOUNTER — Encounter (HOSPITAL_COMMUNITY): Payer: Self-pay

## 2019-03-07 ENCOUNTER — Inpatient Hospital Stay (HOSPITAL_COMMUNITY)
Admission: EM | Admit: 2019-03-07 | Discharge: 2019-03-12 | DRG: 060 | Disposition: A | Discharge status: Home / Self Care | Payer: 59 | Attending: Internal Medicine | Admitting: Internal Medicine

## 2019-03-07 DIAGNOSIS — Z880 Allergy status to penicillin: Secondary | ICD-10-CM

## 2019-03-07 DIAGNOSIS — F4010 Social phobia, unspecified: Secondary | ICD-10-CM

## 2019-03-07 DIAGNOSIS — E785 Hyperlipidemia, unspecified: Secondary | ICD-10-CM | POA: Diagnosis not present

## 2019-03-07 DIAGNOSIS — G35 Multiple sclerosis: Secondary | ICD-10-CM | POA: Diagnosis not present

## 2019-03-07 DIAGNOSIS — G379 Demyelinating disease of central nervous system, unspecified: Secondary | ICD-10-CM

## 2019-03-07 DIAGNOSIS — T380X5A Adverse effect of glucocorticoids and synthetic analogues, initial encounter: Secondary | ICD-10-CM | POA: Diagnosis not present

## 2019-03-07 DIAGNOSIS — R251 Tremor, unspecified: Secondary | ICD-10-CM | POA: Diagnosis present

## 2019-03-07 DIAGNOSIS — Z833 Family history of diabetes mellitus: Secondary | ICD-10-CM

## 2019-03-07 DIAGNOSIS — Z20822 Contact with and (suspected) exposure to covid-19: Secondary | ICD-10-CM | POA: Diagnosis present

## 2019-03-07 DIAGNOSIS — K0889 Other specified disorders of teeth and supporting structures: Secondary | ICD-10-CM

## 2019-03-07 DIAGNOSIS — R739 Hyperglycemia, unspecified: Secondary | ICD-10-CM | POA: Diagnosis not present

## 2019-03-07 DIAGNOSIS — Z823 Family history of stroke: Secondary | ICD-10-CM

## 2019-03-07 DIAGNOSIS — R29898 Other symptoms and signs involving the musculoskeletal system: Secondary | ICD-10-CM

## 2019-03-07 DIAGNOSIS — H04123 Dry eye syndrome of bilateral lacrimal glands: Secondary | ICD-10-CM | POA: Diagnosis present

## 2019-03-07 DIAGNOSIS — R202 Paresthesia of skin: Secondary | ICD-10-CM | POA: Diagnosis present

## 2019-03-07 DIAGNOSIS — F419 Anxiety disorder, unspecified: Secondary | ICD-10-CM | POA: Diagnosis present

## 2019-03-07 DIAGNOSIS — F411 Generalized anxiety disorder: Secondary | ICD-10-CM

## 2019-03-07 DIAGNOSIS — D72829 Elevated white blood cell count, unspecified: Secondary | ICD-10-CM | POA: Diagnosis present

## 2019-03-07 DIAGNOSIS — K029 Dental caries, unspecified: Secondary | ICD-10-CM | POA: Diagnosis present

## 2019-03-07 DIAGNOSIS — F329 Major depressive disorder, single episode, unspecified: Secondary | ICD-10-CM | POA: Diagnosis present

## 2019-03-07 DIAGNOSIS — R200 Anesthesia of skin: Secondary | ICD-10-CM | POA: Diagnosis present

## 2019-03-07 DIAGNOSIS — R682 Dry mouth, unspecified: Secondary | ICD-10-CM | POA: Diagnosis present

## 2019-03-07 DIAGNOSIS — K047 Periapical abscess without sinus: Secondary | ICD-10-CM | POA: Diagnosis present

## 2019-03-07 LAB — COMPREHENSIVE METABOLIC PANEL
ALT: 12 U/L (ref 0–44)
AST: 11 U/L — ABNORMAL LOW (ref 15–41)
Albumin: 4.1 g/dL (ref 3.5–5.0)
Alkaline Phosphatase: 38 U/L (ref 38–126)
Anion gap: 10 (ref 5–15)
BUN: 11 mg/dL (ref 6–20)
CO2: 24 mmol/L (ref 22–32)
Calcium: 9.4 mg/dL (ref 8.9–10.3)
Chloride: 103 mmol/L (ref 98–111)
Creatinine, Ser: 0.72 mg/dL (ref 0.44–1.00)
GFR calc Af Amer: >60 mL/min (ref >60)
GFR calc non Af Amer: >60 mL/min (ref >60)
Glucose, Bld: 86 mg/dL (ref 70–99)
Potassium: 3.8 mmol/L (ref 3.5–5.1)
Sodium: 137 mmol/L (ref 135–145)
Total Bilirubin: 0.6 mg/dL (ref 0.3–1.2)
Total Protein: 7 g/dL (ref 6.5–8.1)

## 2019-03-07 LAB — CBC WITH DIFFERENTIAL/PLATELET
Abs Immature Granulocytes: 0.01 10*3/uL (ref 0.00–0.07)
Basophils Absolute: 0 10*3/uL (ref 0.0–0.1)
Basophils Relative: 0 %
Eosinophils Absolute: 0.1 10*3/uL (ref 0.0–0.5)
Eosinophils Relative: 1 %
HCT: 35.6 % — ABNORMAL LOW (ref 36.0–46.0)
Hemoglobin: 11.9 g/dL — ABNORMAL LOW (ref 12.0–15.0)
Immature Granulocytes: 0 %
Lymphocytes Relative: 25 %
Lymphs Abs: 1.4 10*3/uL (ref 0.7–4.0)
MCH: 31.6 pg (ref 26.0–34.0)
MCHC: 33.4 g/dL (ref 30.0–36.0)
MCV: 94.7 fL (ref 80.0–100.0)
Monocytes Absolute: 0.3 10*3/uL (ref 0.1–1.0)
Monocytes Relative: 6 %
Neutro Abs: 3.6 10*3/uL (ref 1.7–7.7)
Neutrophils Relative %: 68 %
Platelets: 216 10*3/uL (ref 150–400)
RBC: 3.76 MIL/uL — ABNORMAL LOW (ref 3.87–5.11)
RDW: 12.4 % (ref 11.5–15.5)
WBC: 5.4 10*3/uL (ref 4.0–10.5)
nRBC: 0 % (ref 0.0–0.2)

## 2019-03-07 LAB — I-STAT BETA HCG BLOOD, ED (MC, WL, AP ONLY): I-stat hCG, quantitative: <5 m[IU]/mL (ref <5)

## 2019-03-07 IMAGING — CR DG CHEST 2V
2 series · 2 of 2 positions shown · non-contrast
Comparison: Radiograph [DATE]

CLINICAL DATA: Two months of leg weakness, concern for infection

EXAM:
CHEST - 2 VIEW

[chest pa]
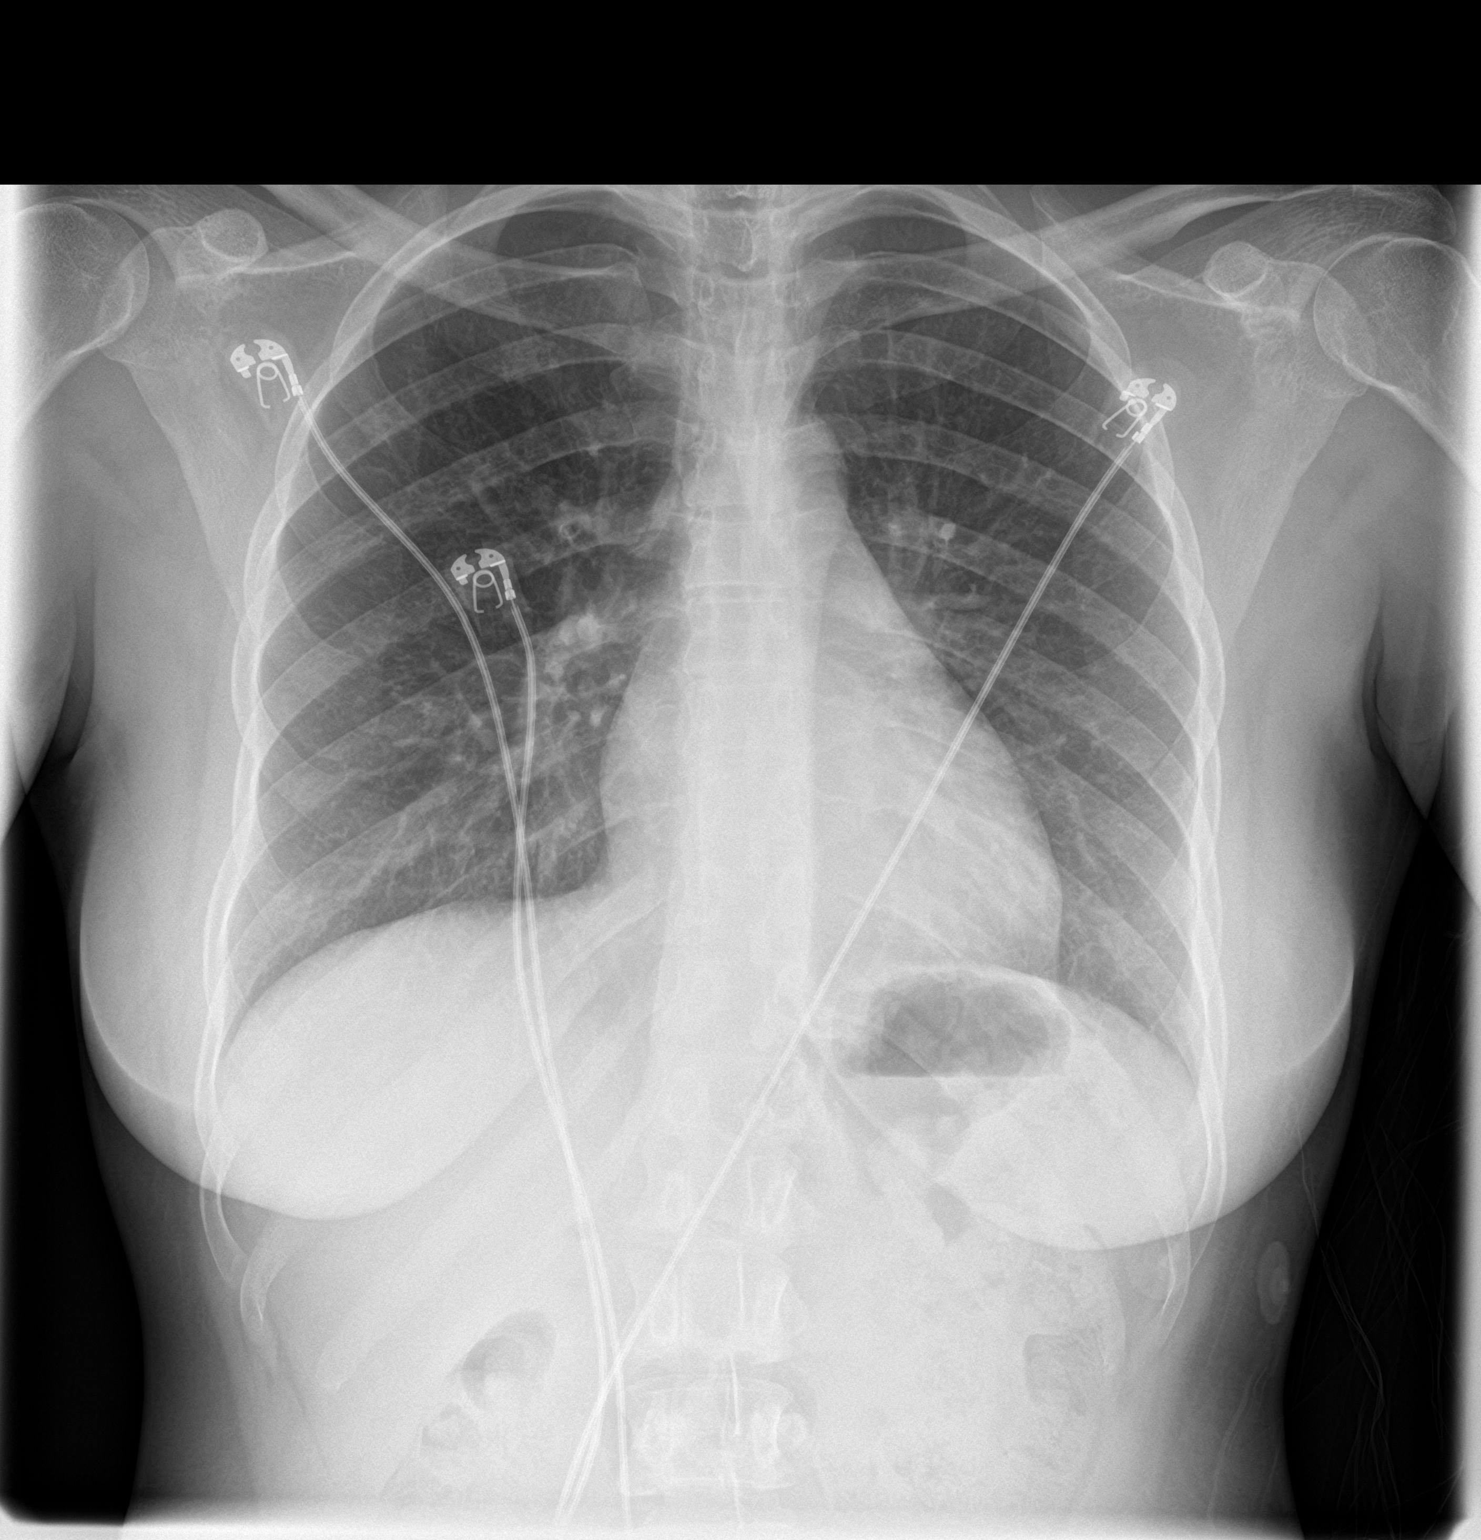

[chest lat]
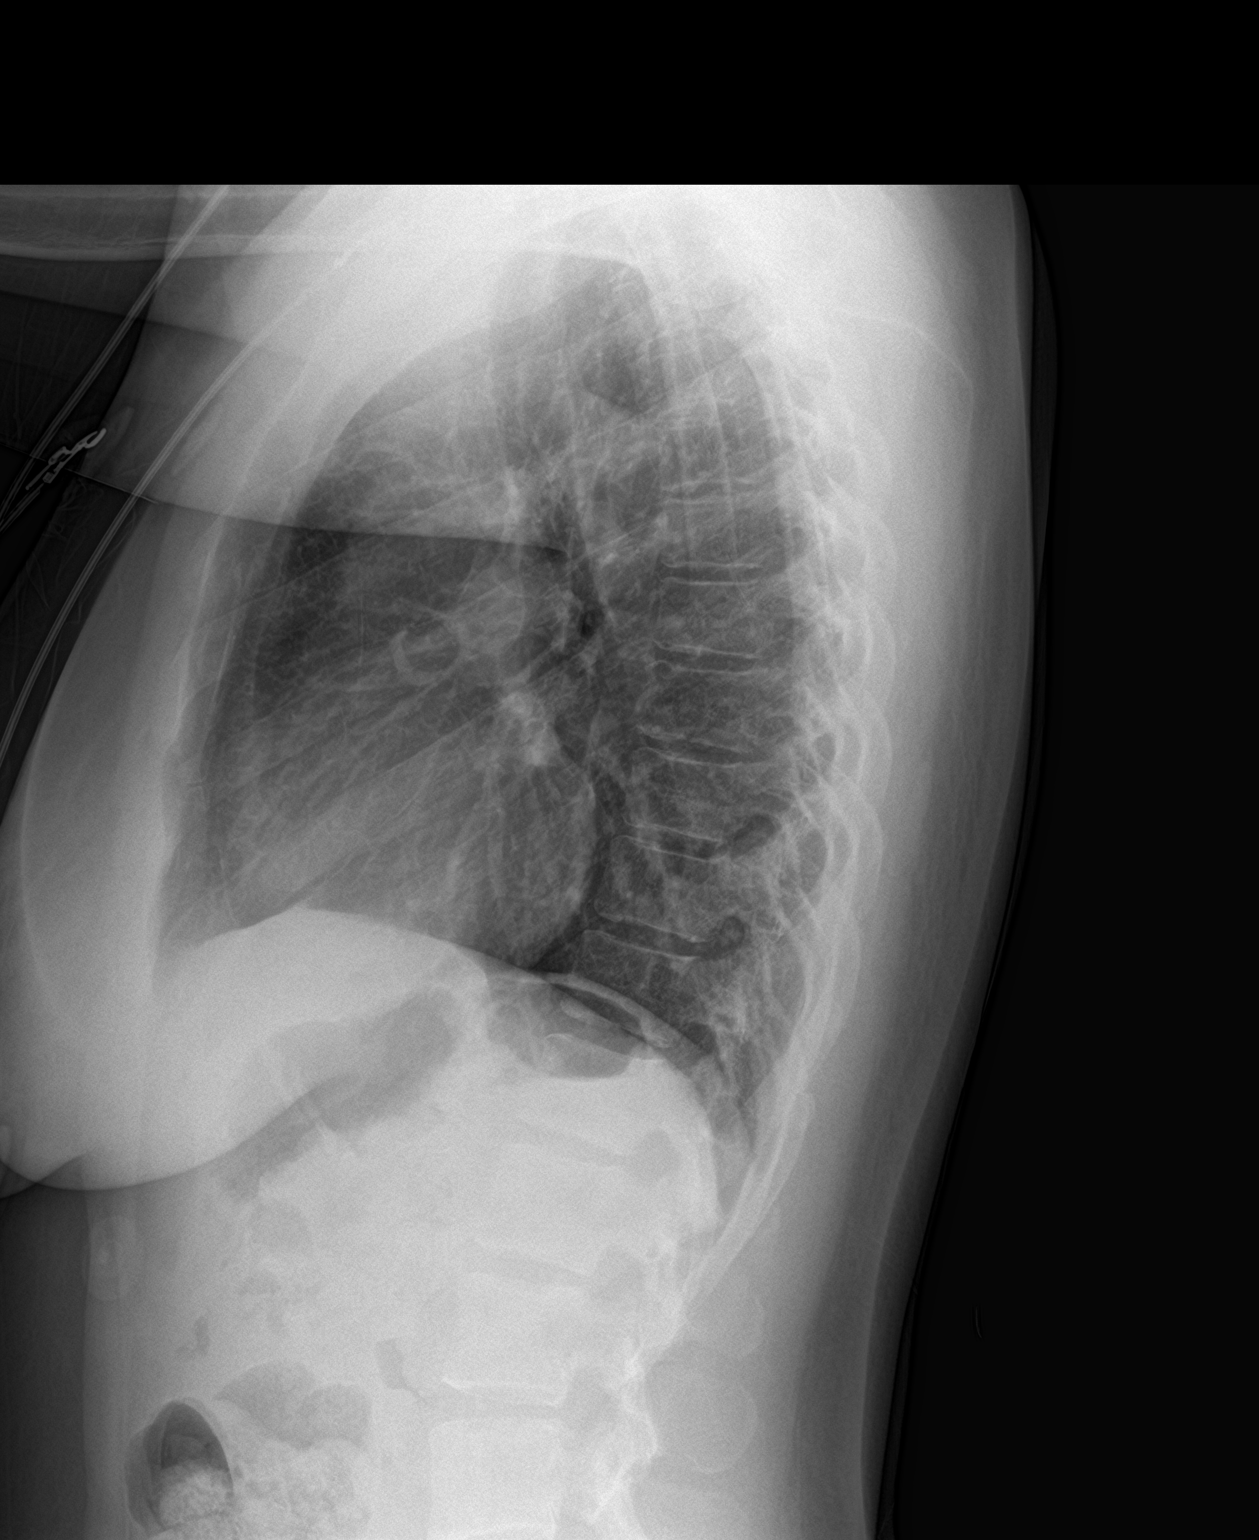

[2 of 2 positions shown; findings below may reference images not displayed]

FINDINGS: No consolidation, features of edema, pneumothorax, or effusion.
Pulmonary vascularity is normally distributed. The cardiomediastinal
contours are unremarkable. No acute osseous or soft tissue
abnormality.
IMPRESSION: No acute cardiopulmonary abnormality.

## 2019-03-07 IMAGING — MR MR HEAD WO/W CM
14 of 16 series · 40 of 48 positions shown · IV contrast (gadavist)
Comparison: No pertinent prior studies available for comparison.

CLINICAL DATA: Parkinson's disease. Per the ED physician, the
patient has no history of Parkinson's disease the patient presents
for 1 month of progressive weakness and neurological complaints.

EXAM:
MRI HEAD WITHOUT AND WITH CONTRAST
TECHNIQUE: Multiplanar, multiecho pulse sequences of the brain and surrounding
structures were obtained without and with intravenous contrast.
CONTRAST:  7mL GADAVIST GADOBUTROL 1 MMOL/ML IV SOLN

[Series 13: DWI · axial · 3.0mm · 0.92mm/px · z∈[-180,-38]mm · 5 of 104 slices shown (1 of 4)]
[im 1/104]
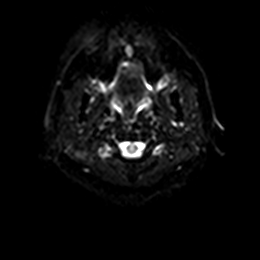
[im 26/104]
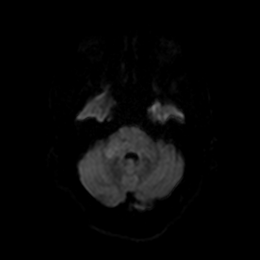
[im 52/104]
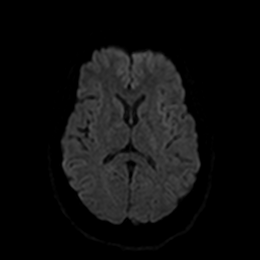
[im 78/104]
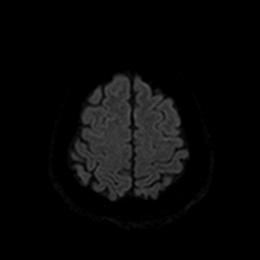
[im 104/104]
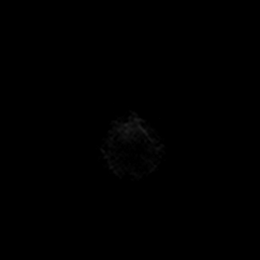

[Series 14: DWI · axial · 3.0mm · 0.92mm/px · z∈[-180,-38]mm · 2 of 52 slices shown (2 of 4)]
[im 1/52]
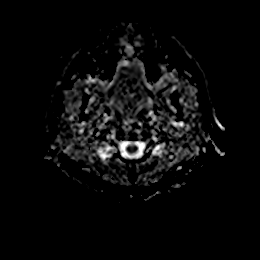
[im 52/52]
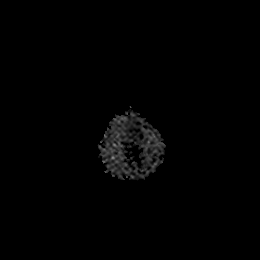

[Series 15: DWI · coronal · 4.0mm · 0.88mm/px · 4 of 72 slices shown (3 of 4)]
[im 1/72]
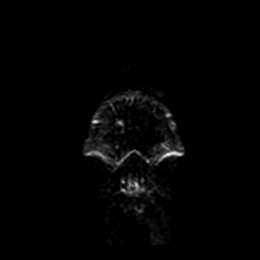
[im 24/72]
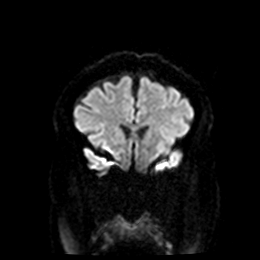
[im 48/72]
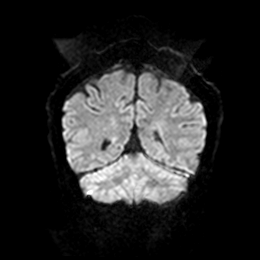
[im 72/72]
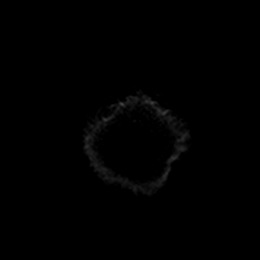

[Series 16: DWI · coronal · 4.0mm · 0.88mm/px · 2 of 36 slices shown (4 of 4)]
[im 1/36]
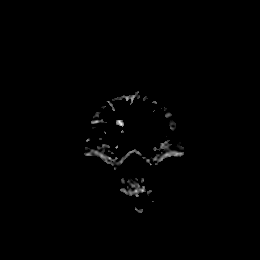
[im 36/36]
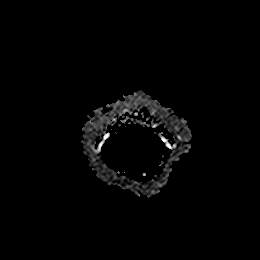

[Series 17: T2 · axial · 5.0mm · 0.72mm/px · z∈[-175,-42]mm · 2 of 25 slices shown]
[im 1/25]
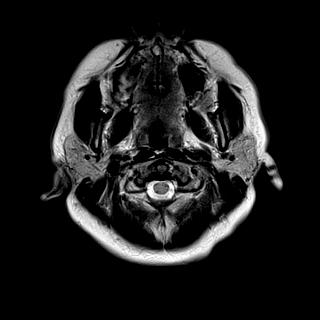
[im 25/25]
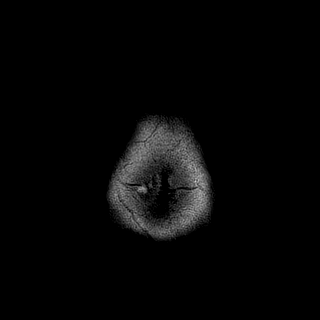

[Series 18: FLAIR · axial · 5.0mm · 0.45mm/px · z∈[-173,-40]mm · 2 of 25 slices shown]
[im 1/25]
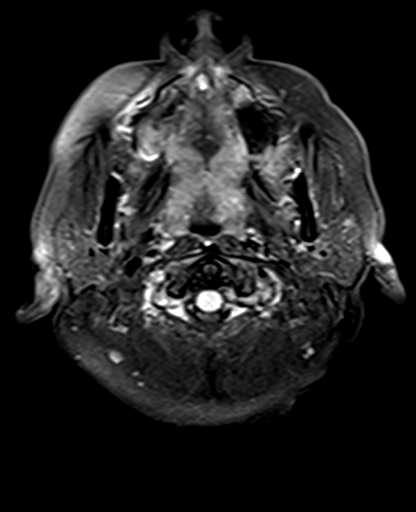
[im 25/25]
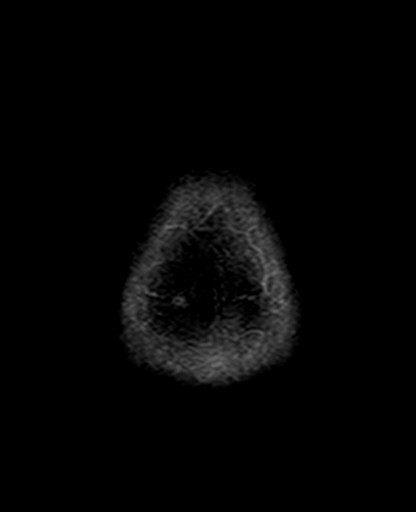

[Series 19: T1 · sagittal · 5.0mm · 0.75mm/px · 2 of 25 slices shown]
[im 1/25]
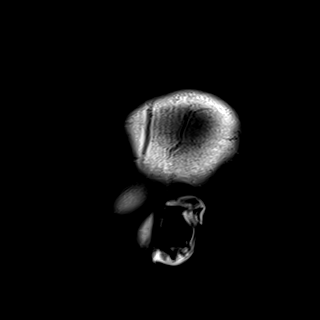
[im 25/25]
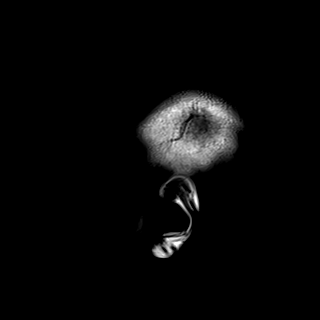

[Series 20: mag_images · axial · 3.0mm · 0.90mm/px · z∈[-187,-23]mm · 4 of 60 slices shown]
[im 1/60]
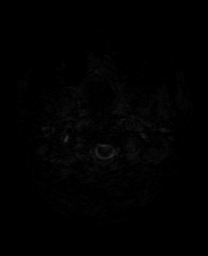
[im 20/60]
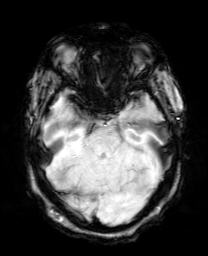
[im 40/60]
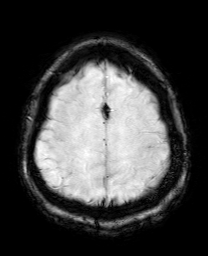
[im 60/60]
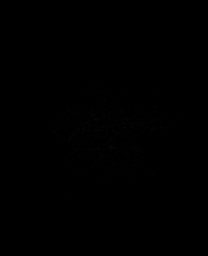

[Series 21: pha_images · axial · 3.0mm · 0.90mm/px · z∈[-187,-26]mm · 4 of 57 slices shown]
[im 1/57]
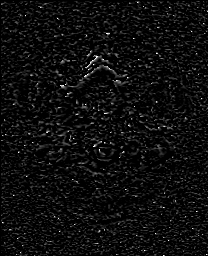
[im 19/57]
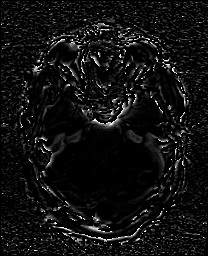
[im 38/57]
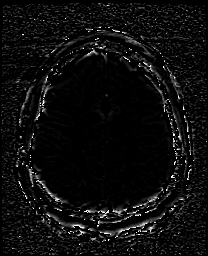
[im 57/57]
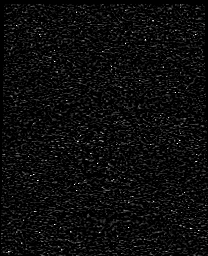

[Series 22: swi_images · axial · 3.0mm · 0.90mm/px · z∈[-187,-23]mm · 4 of 60 slices shown]
[im 1/60]
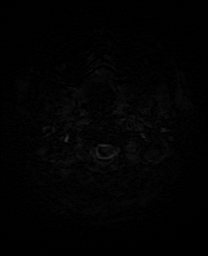
[im 20/60]
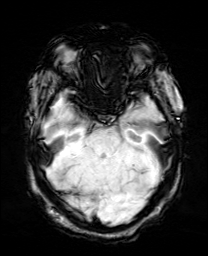
[im 40/60]
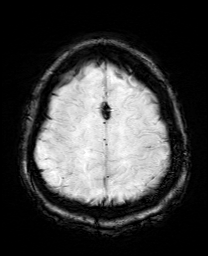
[im 60/60]
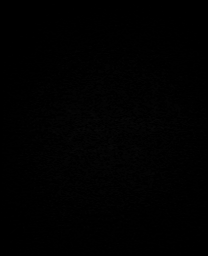

[Series 23: mip_images(sw) · axial · 24.0mm · 0.90mm/px · z∈[-177,-33]mm · 3 of 53 slices shown]
[im 1/53]
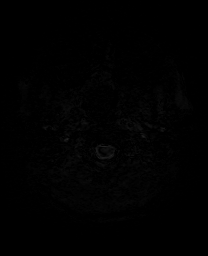
[im 27/53]
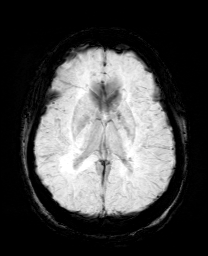
[im 53/53]
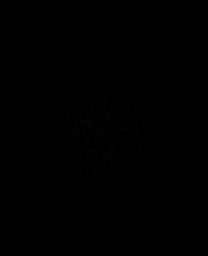

[Series 25: T2 post-contrast · coronal · 5.0mm · 0.72mm/px · 2 of 30 slices shown]
[im 1/30]
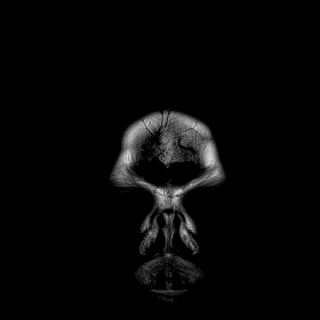
[im 30/30]
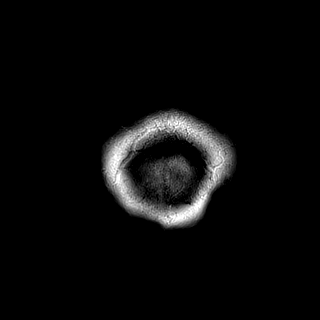

[Series 27: T1 post-contrast · coronal · 5.0mm · 0.34mm/px · 2 of 30 slices shown (1 of 2)]
[im 1/30]
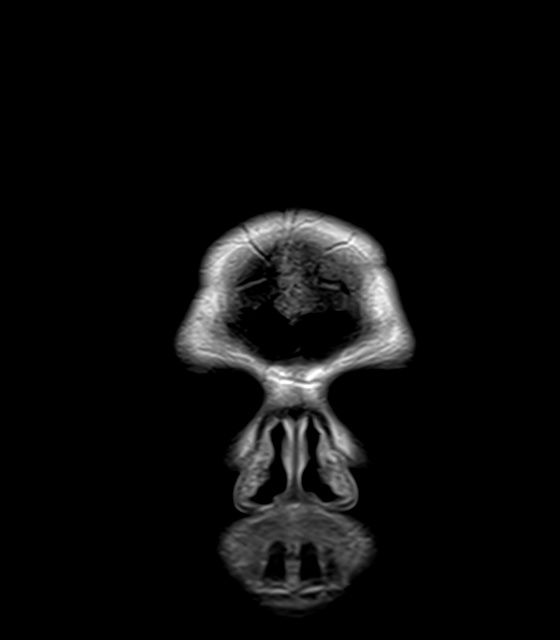
[im 30/30]
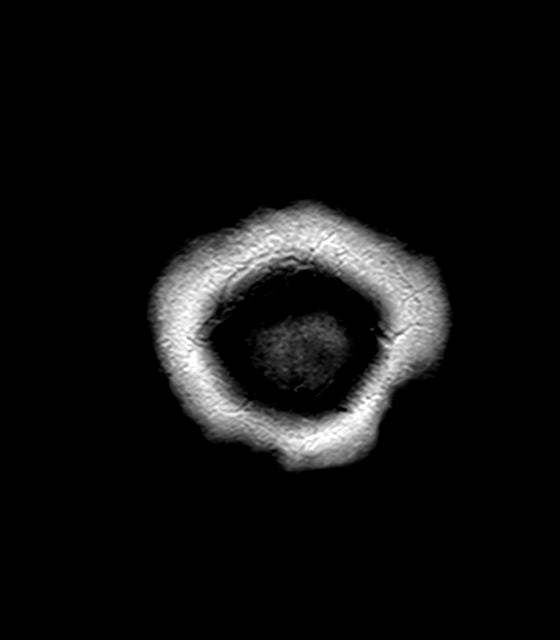

[Series 28: T1 post-contrast · sagittal · 5.0mm · 0.75mm/px · 2 of 25 slices shown (2 of 2)]
[im 1/25]
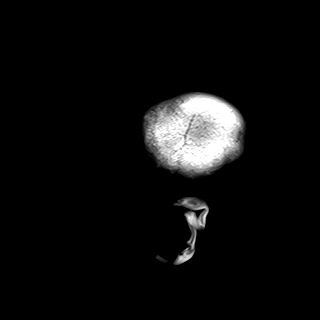
[im 25/25]
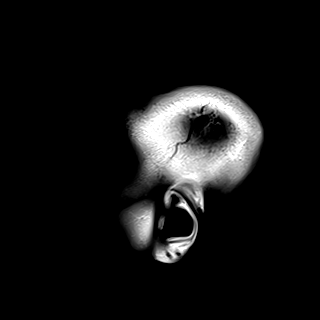

[40 of 48 positions shown; findings below may reference images not displayed]

FINDINGS: Brain:

Sagittal T1 weighted imaging is motion degraded.

There is extensive multifocal T2/FLAIR hyperintense signal
abnormality within the subcortical/juxtacortical and deep
periventricular white matter as well as within the bilateral
internal capsules, brainstem (including the periaqueductal gray),
surrounding the fourth ventricle, within the cerebellar white matter
and right brachium pontis. Numerous sites within the bilateral
cerebral hemispheres demonstrate corresponding enhancement as well
as sites within the right cerebellum and surrounding the fourth
ventricle (see annotations on the axial and coronal postcontrast T1
weighted imaging).

There is no evidence of acute infarct. No midline shift or
extra-axial fluid collection. No chronic intracranial blood
products.

Vascular: Flow voids maintained within the proximal large arterial
vessels.

Skull and upper cervical spine: No focal marrow lesion.

Sinuses/Orbits: Visualized orbits demonstrate no acute abnormality.
Mild ethmoid sinus and right maxillary sinus mucosal thickening. No
significant mastoid effusion. Probable small focus of abnormal
enhancement within the intraorbital left optic nerve (series 26,
image 18).

These results were called by telephone at the time of interpretation
on [DATE] at [DATE] to provider Dr. MELEENA, who verbally
acknowledged these results.
IMPRESSION: Extensive multifocal white matter signal abnormality involving the
subcortical/juxtacortical and deep periventricular white matter
within both cerebral hemispheres, the brainstem (including the
periaqueductal gray), the margin of the fourth ventricle, as well as
the cerebellar white matter and right brachium pontis. There are
numerous sites of corresponding enhancement within the bilateral
cerebral hemispheres, as well as within the right cerebellum and
surrounding the fourth ventricle. Additionally, there is probable
focal abnormal enhancement of the intraorbital left optic nerve.
Findings are most consistent with a demyelinating process with
active demyelination. Given the pattern of involvement
intracranially and the longitudinally extensive involvement of the
cervical and visualized thoracic spinal cord, consider neuromyelitis
MELEENA (NMO).

## 2019-03-07 IMAGING — MR MR CERVICAL SPINE WO/W CM
5 of 8 series · 28 of 48 positions shown · IV contrast (gadavist)
Comparison: Concurrently performed brain MRI

CLINICAL DATA: Neck pain, chronic, no prior imaging.

EXAM:
MRI CERVICAL SPINE WITHOUT AND WITH CONTRAST
TECHNIQUE: Multiplanar and multiecho pulse sequences of the cervical spine, to
include the craniocervical junction and cervicothoracic junction,
were obtained without and with intravenous contrast.
CONTRAST:  7mL GADAVIST GADOBUTROL 1 MMOL/ML IV SOLN

[Series 13: T2 · sagittal · 3.0mm · 0.69mm/px · 4 of 15 slices shown (1 of 2)]
[im 1/15]
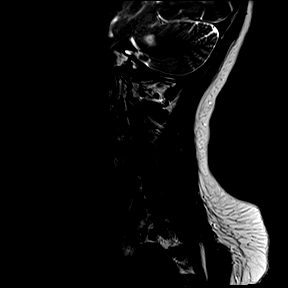
[im 5/15]
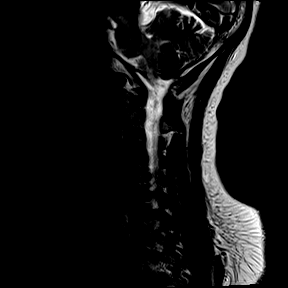
[im 10/15]
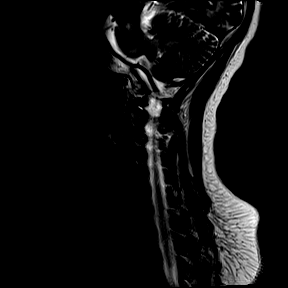
[im 15/15]
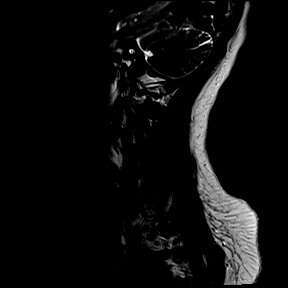

[Series 14: T1 · sagittal · 3.0mm · 0.69mm/px · 4 of 15 slices shown (1 of 2)]
[im 1/15]
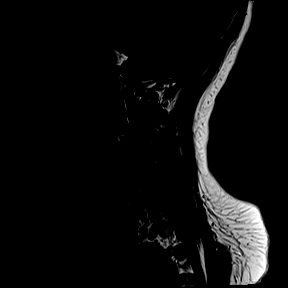
[im 5/15]
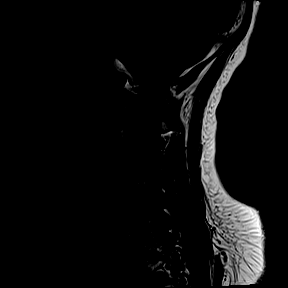
[im 10/15]
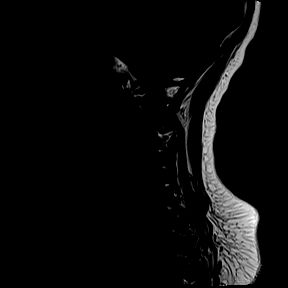
[im 15/15]
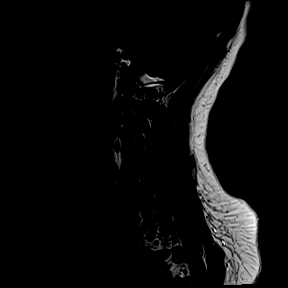

[Series 16: T2 · axial · 3.0mm · 0.66mm/px · z∈[-265,-172]mm · 8 of 32 slices shown (2 of 2)]
[im 1/32]
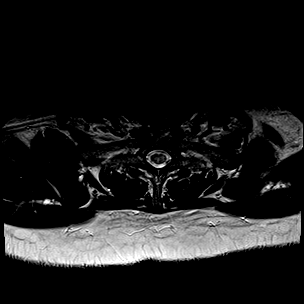
[im 5/32]
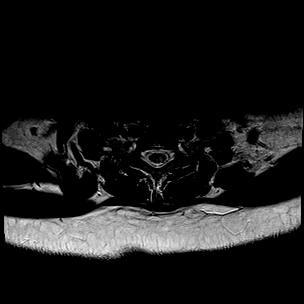
[im 9/32]
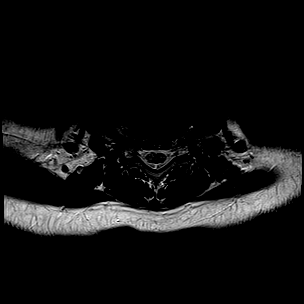
[im 14/32]
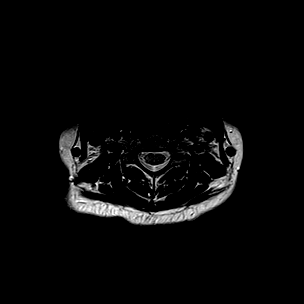
[im 18/32]
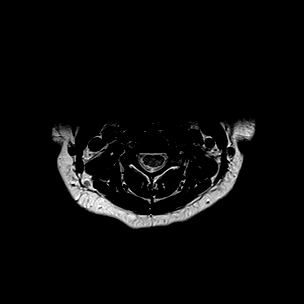
[im 23/32]
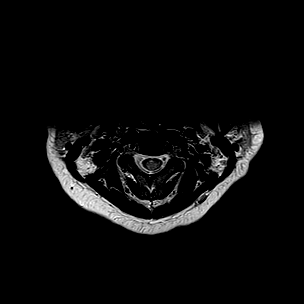
[im 27/32]
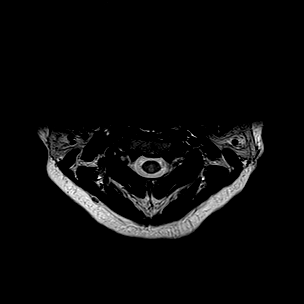
[im 32/32]
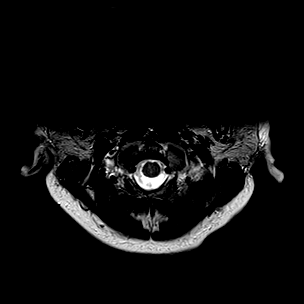

[Series 18: T1 · axial · 3.0mm · 0.39mm/px · z∈[-266,-173]mm · 8 of 32 slices shown (2 of 2)]
[im 1/32]
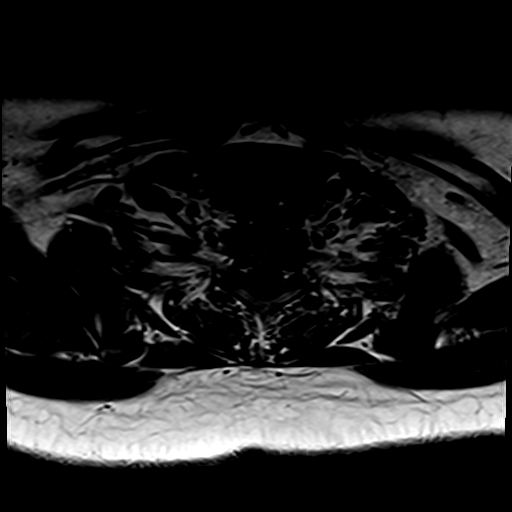
[im 5/32]
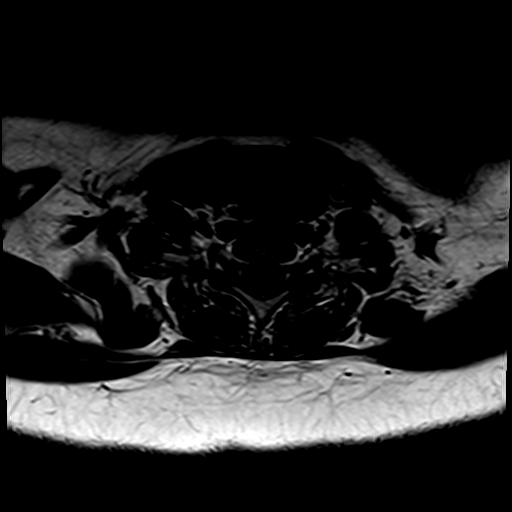
[im 9/32]
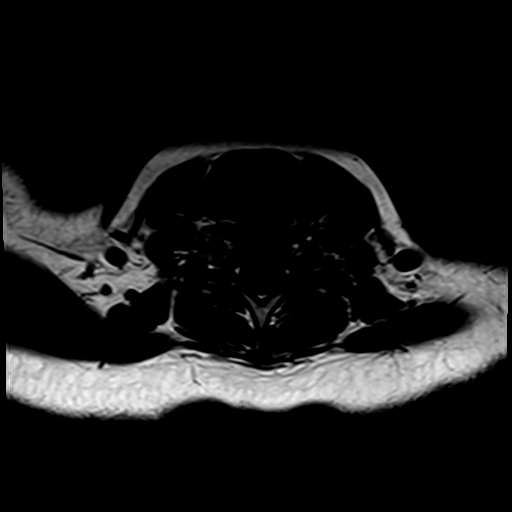
[im 14/32]
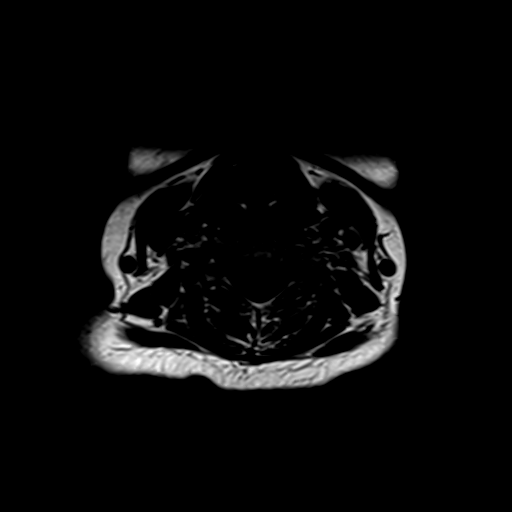
[im 18/32]
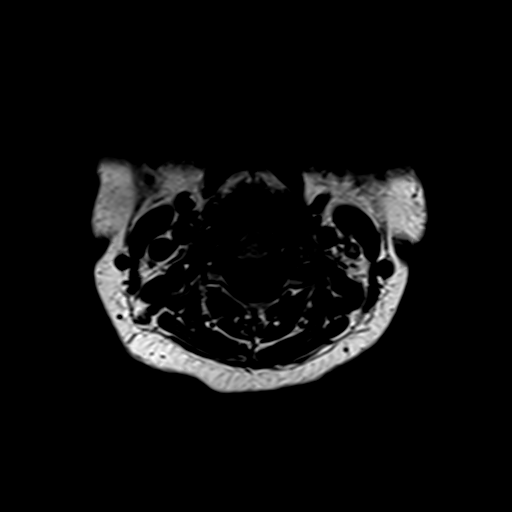
[im 23/32]
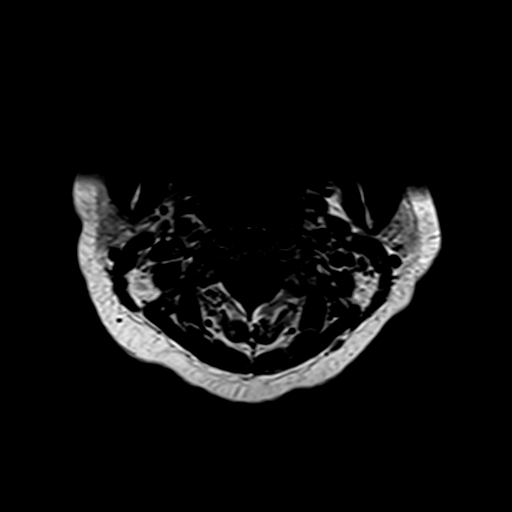
[im 27/32]
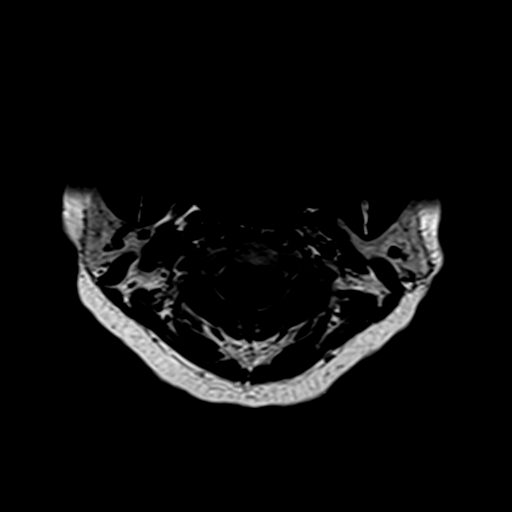
[im 32/32]
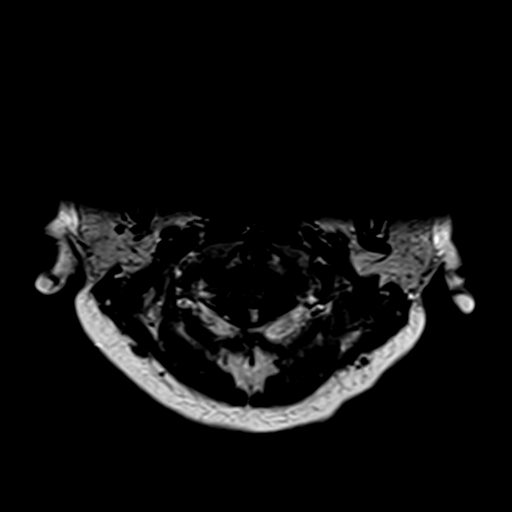

[Series 19: T1 fat-sat post-contrast · sagittal · 3.0mm · 0.35mm/px · 4 of 15 slices shown]
[im 1/15]
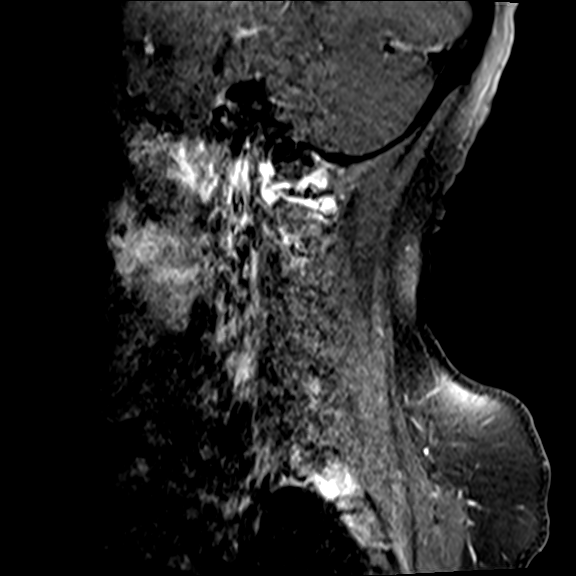
[im 5/15]
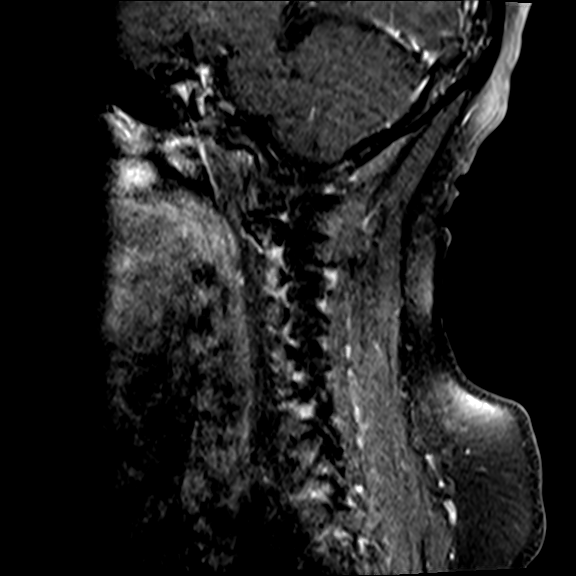
[im 10/15]
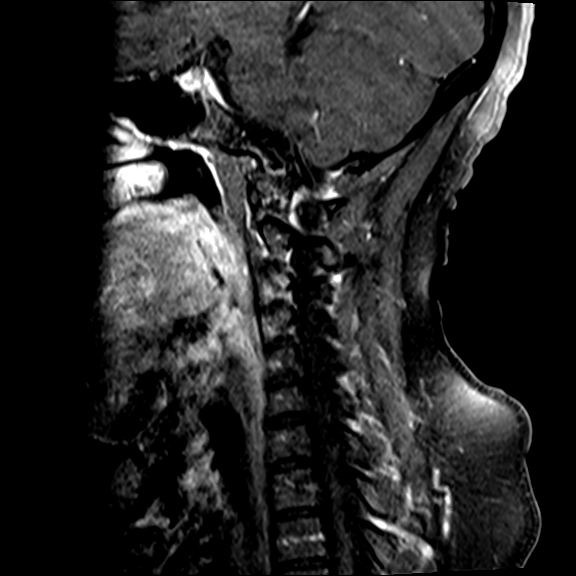
[im 15/15]
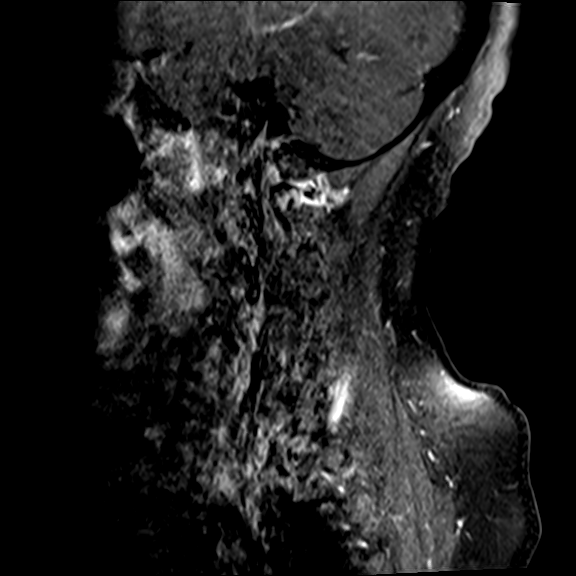

[28 of 48 positions shown; findings below may reference images not displayed]

FINDINGS: Alignment: Straightening of the expected cervical lordosis. No
significant spondylolisthesis.

Vertebrae: Vertebral body height is maintained. No marrow edema or
suspicious osseous lesion.

Cord: There is extensive multifocal T2/STIR hyperintense signal
abnormality within the cervical and visualized upper thoracic spinal
cord. For instance, there is a large longitudinally extensive focus
of cord signal abnormality spanning the C2-C4 levels (series 15,
image 8). Longitudinally extensive involvement is also seen
throughout much of the lower cervical and visualized upper thoracic
cord (series 15, image 9). Postcontrast imaging is moderately motion
degraded. Within this limitation, no definite abnormal cord
enhancement is identified.

Posterior Fossa, vertebral arteries, paraspinal tissues: Posterior
fossa better assessed on concurrently performed brain MRI. The
paraspinal soft tissues are within normal limits. Flow voids
preserved within the cervical vertebral arteries.

Disc levels:

Intervertebral disc height is preserved throughout the cervical
spine. No significant disc herniation, spinal canal stenosis or
neural foraminal narrowing.

These results were called by telephone at the time of interpretation
on [DATE] at [DATE] to provider Dr. SEIJA, who verbally
acknowledged these results.
IMPRESSION: Longitudinally extensive multifocal spinal cord signal abnormality
involving the majority of the cervical and visualized upper thoracic
spinal cord. Findings are most consistent with a demyelinating
process. Given the longitudinally extensive involvement of the
cervical and visualized upper thoracic spinal cord and the
intracranial findings, consider neuromyelitis SEIJA (NMO). Motion
degraded postcontrast imaging. No definite abnormal cord enhancement
is identified.

## 2019-03-07 MED ORDER — SODIUM CHLORIDE 0.9% FLUSH
3.0000 mL | Freq: Two times a day (BID) | INTRAVENOUS | Status: DC
  Administered 2019-03-08 – 2019-03-12 (×9): 3 mL via INTRAVENOUS

## 2019-03-07 MED ORDER — ENOXAPARIN SODIUM 40 MG/0.4ML ~~LOC~~ SOLN
40.0000 mg | SUBCUTANEOUS | Status: DC
  Administered 2019-03-08 – 2019-03-11 (×5): 40 mg via SUBCUTANEOUS
  Filled 2019-03-07 (×5): qty 0.4

## 2019-03-07 MED ORDER — GADOBUTROL 1 MMOL/ML IV SOLN
7.0000 mL | Freq: Once | INTRAVENOUS | Status: AC | PRN
  Administered 2019-03-07: 19:00:00 7 mL via INTRAVENOUS

## 2019-03-07 MED ORDER — TRAZODONE HCL 50 MG PO TABS
50.0000 mg | ORAL_TABLET | Freq: Every evening | ORAL | Status: DC | PRN
  Administered 2019-03-08 – 2019-03-11 (×4): 50 mg via ORAL
  Filled 2019-03-07 (×4): qty 1

## 2019-03-07 MED ORDER — ACETAMINOPHEN 325 MG PO TABS: 650.0000 mg | ORAL_TABLET | Freq: Four times a day (QID) | ORAL | Status: DC | PRN

## 2019-03-07 MED ORDER — SERTRALINE HCL 50 MG PO TABS
50.0000 mg | ORAL_TABLET | Freq: Every day | ORAL | Status: DC
  Administered 2019-03-08 – 2019-03-12 (×5): 50 mg via ORAL
  Filled 2019-03-07 (×5): qty 1

## 2019-03-07 MED ORDER — SODIUM CHLORIDE 0.9 % IV SOLN: 250.0000 mL | INTRAVENOUS | Status: DC | PRN

## 2019-03-07 MED ORDER — SODIUM CHLORIDE 0.9% FLUSH: 3.0000 mL | INTRAVENOUS | Status: DC | PRN

## 2019-03-07 MED ORDER — ACETAMINOPHEN 650 MG RE SUPP: 650.0000 mg | Freq: Four times a day (QID) | RECTAL | Status: DC | PRN

## 2019-03-07 MED ORDER — PRAVASTATIN SODIUM 10 MG PO TABS
20.0000 mg | ORAL_TABLET | Freq: Every day | ORAL | Status: DC
  Administered 2019-03-08 – 2019-03-12 (×5): 20 mg via ORAL
  Filled 2019-03-07 (×5): qty 2

## 2019-03-07 NOTE — ED Notes (Signed)
Pt wants to be seen by Neiro before 03-20-19.

## 2019-03-07 NOTE — ED Notes (Signed)
Pt reports the weakness in her legs is worse. Pt reports having an appt  On2/26 but came today because she feels worse. Pt has been using  A walker for 2 months.

## 2019-03-07 NOTE — H&P (Addendum)
TRH H&P    Patient Demographics:    Abigail Lewis, is a 29 y.o. female  MRN: [id number]  DOB - 09-Aug-1990  Admit Date - 03/07/2019  Referring MD/NP/PA:   Janetta Hora   Outpatient Primary MD for the patient is [facility]  Patient coming from:  home  Chief complaint- weakness, slurred speech   HPI:    Abigail Lewis  is a 29 y.o. female,  w anxiety/ depression,  Dizziness starting in September 2020.  Numbness, tingling in bilateral legs for the past 2 years.  November -> dx with vertigo by pcp. tx with meclizine without benefit.   At the end of November her left hand would start shaking.  Labs in November + for ANA and also Sjogrens Ro ab.  Lyme test  ? Positive.  CT  Brain 02/13/19-> negative. Pt for the past 2 days could not stand for more than 2 sec.  Pt states that her legs shake when she stands up.  Pt has had slight headache in the middle of her scalp  , starting today.  Slight slurred speech yesterday and today.  No vision change,  No weakness.  No rash.   No recent viral illness.    In ED,  T 98.3, P 76, R 14, Bp 111/82,  Pox 98% on RA   MRI brain IMPRESSION: Extensive multifocal white matter signal abnormality involving the subcortical/juxtacortical and deep periventricular white matter within both cerebral hemispheres, the brainstem (including the periaqueductal gray), the margin of the fourth ventricle, as well as the cerebellar white matter and right brachium pontis. There are numerous sites of corresponding enhancement within the bilateral cerebral hemispheres, as well as within the right cerebellum and surrounding the fourth ventricle. Additionally, there is probable focal abnormal enhancement of the intraorbital left optic nerve. Findings are most consistent with a demyelinating process with active demyelination. Given the pattern of  involvement intracranially and the longitudinally extensive involvement of the cervical and visualized thoracic spinal cord, consider neuromyelitis optica (NMO).   MRI cervical spine IMPRESSION: Longitudinally extensive multifocal spinal cord signal abnormality involving the majority of the cervical and visualized upper thoracic spinal cord. Findings are most consistent with a demyelinating process. 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 Motion degraded postcontrast imaging. No definite abnormal cord enhancement is identified.  CXR IMPRESSION: Longitudinally extensive multifocal spinal cord signal abnormality involving the majority of the cervical and visualized upper thoracic spinal cord. Findings are most consistent with a demyelinating process. 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 Motion degraded postcontrast imaging. No definite abnormal cord enhancement is identified.  wbc 5.4, Hgb 11.9, Plt 216 Na 137, K 3.8, Bun 11, creatinine 0.72 Ast 11, Alt 12  Urinalysis pending  LP pending  ED consulted neurology , appreciate input   Pt will be admitted for numbness, tingling in bilateral distal lower  ext and progressive weakness, and shaking.      Review of systems:    In addition to the HPI above,  No Fever-chills, No Headache, No changes with Vision or hearing, No problems swallowing food or Liquids, No Chest pain, Cough or Shortness of Breath, No Abdominal pain, No Nausea or Vomiting, bowel movements are regular, No Blood in stool or Urine, No dysuria, No new skin rashes or bruises, No new joints pains-aches,    No recent weight gain or loss, No polyuria, polydypsia or polyphagia, No significant  Mental Stressors.  All other systems reviewed and are negative.    Past History of the following :    Past Medical History:  Diagnosis Date  . Anxiety   . Depression       Past Surgical History:  Procedure Laterality Date  . NO PAST SURGERIES        Social History:      Social History   Tobacco Use  . Smoking status: Never Smoker  . Smokeless tobacco: Never Used  Substance Use Topics  . Alcohol use: No       Family History :     Family History  Problem Relation Age of Onset  . Stroke Mother   . Diabetes Father   . Healthy Sister   . Autism Brother        Home Medications:   Prior to Admission medications   Medication Sig Start Date End Date Taking? Authorizing Provider  pravastatin (PRAVACHOL) 20 MG tablet Take 20 mg by mouth daily. 02/28/19  Yes [provider]  sertraline (ZOLOFT) 25 MG tablet TAKE 1 TABLET BY MOUTH EVERY DAY Patient taking differently: Take 25 mg by mouth daily.  12/06/16  Yes Hairston, Maylon Peppers, FNP  traZODone (DESYREL) 50 MG tablet TAKE 1 TABLET BY MOUTH AT BEDTIME AS NEEDED FOR SLEEP Patient taking differently: Take 50 mg by mouth at bedtime as needed for sleep.  12/11/16  Yes Hairston, Maylon Peppers, FNP  acetaminophen (TYLENOL) 500 MG tablet Take 2 tablets (1,000 mg total) by mouth every 8 (eight) hours as needed for moderate pain. Patient not taking: Reported on 03/07/2019 12/30/15   Frederica Kuster, PA-C     Allergies:     Allergies  Allergen Reactions  . Penicillins Swelling     Physical Exam:   Vitals  Blood pressure 103/74, pulse 83, temperature 98.3 F (36.8 C), temperature source Oral, resp. rate (!) 9, SpO2 98 %, unknown if currently breastfeeding.  1.  General: axoxo3  2. Psychiatric: euthymic  3. Neurologic: Slight tremor of the left hand,  Slight pronator drift of the left hand cn2-12 intact, reflexes 2+ symmetric, diffuse with no clonus, downgoing toes bilaterally, motor 5/5 in all 4 ext  4.  HEENMT:  Anicteric, pupils 1.4m symmetric, direct, consensual , near intact Eomi, no nystagmus Neck: no jvd, no bruit  5. Respiratory : CTAB  6. Cardiovascular : rrr s1, s2, no m/g/r  7. Gastrointestinal:  Abd: soft, obese, nt, nd, +bs  8. Skin:  Ext: no c/c/e,  No rash  9.Musculoskeletal:  Good ROM     Data Review:    CBC Recent Labs  Lab 03/07/19 1650  WBC 5.4  HGB 11.9*  HCT 35.6*  PLT 216  MCV 94.7  MCH 31.6  MCHC 33.4  RDW 12.4  LYMPHSABS 1.4  MONOABS 0.3  EOSABS 0.1  BASOSABS 0.0   ------------------------------------------------------------------------------------------------------------------  Results for orders placed or performed during the hospital encounter of  03/07/19 (from the past 48 hour(s))  CBC with Differential     Status: Abnormal   Collection Time: 03/07/19  4:50 PM  Result Value Ref Range   WBC 5.4 4.0 - 10.5 K/uL   RBC 3.76 (L) 3.87 - 5.11 MIL/uL   Hemoglobin 11.9 (L) 12.0 - 15.0 g/dL   HCT 35.6 (L) 36.0 - 46.0 %   MCV 94.7 80.0 - 100.0 fL   MCH 31.6 26.0 - 34.0 pg   MCHC 33.4 30.0 - 36.0 g/dL   RDW 12.4 11.5 - 15.5 %   Platelets 216 150 - 400 K/uL   nRBC 0.0 0.0 - 0.2 %   Neutrophils Relative % 68 %   Neutro Abs 3.6 1.7 - 7.7 K/uL   Lymphocytes Relative 25 %   Lymphs Abs 1.4 0.7 - 4.0 K/uL   Monocytes Relative 6 %   Monocytes Absolute 0.3 0.1 - 1.0 K/uL   Eosinophils Relative 1 %   Eosinophils Absolute 0.1 0.0 - 0.5 K/uL   Basophils Relative 0 %   Basophils Absolute 0.0 0.0 - 0.1 K/uL   Immature Granulocytes 0 %   Abs Immature Granulocytes 0.01 0.00 - 0.07 K/uL    Comment: Performed at [facility], [address], [place], [place] [address]  Comprehensive metabolic panel     Status: Abnormal   Collection Time: 03/07/19  4:50 PM  Result Value Ref Range   Sodium 137 135 - 145 mmol/L   Potassium 3.8 3.5 - 5.1 mmol/L   Chloride 103 98 - 111 mmol/L   CO2 24 22 - 32 mmol/L   Glucose, Bld 86 70 - 99 mg/dL   BUN 11 6  - 20 mg/dL   Creatinine, Ser 0.72 0.44 - 1.00 mg/dL   Calcium 9.4 8.9 - 10.3 mg/dL   Total Protein 7.0 6.5 - 8.1 g/dL   Albumin 4.1 3.5 - 5.0 g/dL   AST 11 (L) 15 - 41 U/L   ALT 12 0 - 44 U/L   Alkaline Phosphatase 38 38 - 126 U/L   Total Bilirubin 0.6 0.3 - 1.2 mg/dL   GFR calc non Af Amer >60 >60 mL/min   GFR calc Af Amer >60 >60 mL/min   Anion gap 10 5 - 15    Comment: Performed at [facility] [address], [place],  [address]  I-Stat beta hCG blood, ED     Status: None   Collection Time: 03/07/19  5:26 PM  Result Value Ref Range   I-stat hCG, quantitative <5.0 <5 mIU/mL   Comment 3            Comment:   GEST. AGE      CONC.  (mIU/mL)   <=1 WEEK        5 - 50     2 WEEKS       50 - 500     3 WEEKS       100 - 10,000     4 WEEKS     1,000 - 30,000        FEMALE AND NON-PREGNANT FEMALE:     LESS THAN 5 mIU/mL     Chemistries  Recent Labs  Lab 03/07/19 1650  NA 137  K 3.8  CL 103  CO2 24  GLUCOSE 86  BUN 11  CREATININE 0.72  CALCIUM 9.4  AST 11*  ALT 12  ALKPHOS 38  BILITOT 0.6   ------------------------------------------------------------------------------------------------------------------  ------------------------------------------------------------------------------------------------------------------ GFR: CrCl cannot  be calculated (Unknown ideal weight.). Liver Function Tests: Recent Labs  Lab 03/07/19 1650  AST 11*  ALT 12  ALKPHOS 38  BILITOT 0.6  PROT 7.0  ALBUMIN 4.1   No results for input(s): LIPASE, AMYLASE in the last 168 hours. No results for input(s): AMMONIA in the last 168 hours. Coagulation Profile: No results for input(s): INR, PROTIME in the last 168 hours. Cardiac Enzymes: No results for input(s): CKTOTAL, CKMB, CKMBINDEX, TROPONINI in the last 168 hours. BNP (last 3 results) No results for input(s): PROBNP in the last 8760 hours. HbA1C: No results for input(s): HGBA1C in the last 72 hours. CBG: No results  for input(s): GLUCAP in the last 168 hours. Lipid Profile: No results for input(s): CHOL, HDL, LDLCALC, TRIG, CHOLHDL, LDLDIRECT in the last 72 hours. Thyroid Function Tests: No results for input(s): TSH, T4TOTAL, FREET4, T3FREE, THYROIDAB in the last 72 hours. Anemia Panel: No results for input(s): VITAMINB12, FOLATE, FERRITIN, TIBC, IRON, RETICCTPCT in the last 72 hours.  --------------------------------------------------------------------------------------------------------------- Urine analysis:    Component Value Date/Time   LABSPEC >=1.030 12/11/2010 0846   PHURINE 6.0 12/11/2010 0846   GLUCOSEU NEGATIVE 12/11/2010 0846   HGBUR LARGE (A) 12/11/2010 0846   BILIRUBINUR SMALL (A) 12/11/2010 0846   KETONESUR 15 (A) 12/11/2010 0846   PROTEINUR >=300 (A) 12/11/2010 0846   UROBILINOGEN 1.0 12/11/2010 0846   NITRITE NEGATIVE 12/11/2010 0846   LEUKOCYTESUR MODERATE (A) 12/11/2010 0846      Imaging Results:    DG Chest 2 View  Result Date: 03/07/2019 CLINICAL DATA:  Two months of leg weakness, concern for infection EXAM: CHEST - 2 VIEW COMPARISON:  Radiograph 04/11/2017 FINDINGS: No consolidation, features of edema, pneumothorax, or effusion. Pulmonary vascularity is normally distributed. The cardiomediastinal contours are unremarkable. No acute osseous or soft tissue abnormality. IMPRESSION: No acute cardiopulmonary abnormality. Electronically Signed   By: Lovena Le M.D.   On: 03/07/2019 20:24   MR Brain W and Wo Contrast  Result Date: 03/07/2019 CLINICAL DATA:  Parkinson's disease. Per the ED physician, the patient has no history of Parkinson's disease the patient presents for 1 month of progressive weakness and neurological complaints. EXAM: MRI HEAD WITHOUT AND WITH CONTRAST TECHNIQUE: Multiplanar, multiecho pulse sequences of the brain and surrounding structures were obtained without and with intravenous contrast. CONTRAST:  25m GADAVIST GADOBUTROL 1 MMOL/ML IV SOLN COMPARISON:   No pertinent prior studies available for comparison. FINDINGS: Brain: Sagittal T1 weighted imaging is motion degraded. There is extensive multifocal T2/FLAIR hyperintense signal abnormality within the subcortical/juxtacortical and deep periventricular white matter as well as within the bilateral internal capsules, brainstem (including the periaqueductal gray), surrounding the fourth ventricle, within the cerebellar white matter and right brachium pontis. Numerous sites within the bilateral cerebral hemispheres demonstrate corresponding enhancement as well as sites within the right cerebellum and surrounding the fourth ventricle (see annotations on the axial and coronal postcontrast T1 weighted imaging). There is no evidence of acute infarct. No midline shift or extra-axial fluid collection. No chronic intracranial blood products. Vascular: Flow voids maintained within the proximal large arterial vessels. Skull and upper cervical spine: No focal marrow lesion. Sinuses/Orbits: Visualized orbits demonstrate no acute abnormality. Mild ethmoid sinus and right maxillary sinus mucosal thickening. No significant mastoid effusion. Probable small focus of abnormal enhancement within the intraorbital left optic nerve (series 26, image 18). These results were called by telephone at the time of interpretation on 03/07/2019 at 8:00 pm to provider Dr. PMaryan Rued who verbally acknowledged these results. IMPRESSION: Extensive  multifocal white matter signal abnormality involving the subcortical/juxtacortical and deep periventricular white matter within both cerebral hemispheres, the brainstem (including the periaqueductal gray), the margin of the fourth ventricle, as well as the cerebellar white matter and right brachium pontis. There are numerous sites of corresponding enhancement within the bilateral cerebral hemispheres, as well as within the right cerebellum and surrounding the fourth ventricle. Additionally, there is probable  focal abnormal enhancement of the intraorbital left optic nerve. Findings are most consistent with a demyelinating process with active demyelination. 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 Electronically Signed   By: Kellie Simmering DO   On: 03/07/2019 20:05   MR Cervical Spine W or Wo Contrast  Result Date: 03/07/2019 CLINICAL DATA:  Neck pain, chronic, no prior imaging. EXAM: MRI CERVICAL SPINE WITHOUT AND WITH CONTRAST TECHNIQUE: Multiplanar and multiecho pulse sequences of the cervical spine, to include the craniocervical junction and cervicothoracic junction, were obtained without and with intravenous contrast. CONTRAST:  80m GADAVIST GADOBUTROL 1 MMOL/ML IV SOLN COMPARISON:  Concurrently performed brain MRI FINDINGS: Alignment: Straightening of the expected cervical lordosis. No significant spondylolisthesis. Vertebrae: Vertebral body height is maintained. No marrow edema or suspicious osseous lesion. Cord: There is extensive multifocal T2/STIR hyperintense signal abnormality within the cervical and visualized upper thoracic spinal cord. For instance, there is a large longitudinally extensive focus of cord signal abnormality spanning the C2-C4 levels (series 15, image 8). Longitudinally extensive involvement is also seen throughout much of the lower cervical and visualized upper thoracic cord (series 15, image 9). Postcontrast imaging is moderately motion degraded. Within this limitation, no definite abnormal cord enhancement is identified. Posterior Fossa, vertebral arteries, paraspinal tissues: Posterior fossa better assessed on concurrently performed brain MRI. The paraspinal soft tissues are within normal limits. Flow voids preserved within the cervical vertebral arteries. Disc levels: Intervertebral disc height is preserved throughout the cervical spine. No  significant disc herniation, spinal canal stenosis or neural foraminal narrowing. These results were called by telephone at the time of interpretation on 03/07/2019 at 8:10 pm to provider Dr. PMaryan Rued who verbally acknowledged these results. IMPRESSION: Longitudinally extensive multifocal spinal cord signal abnormality involving the majority of the cervical and visualized upper thoracic spinal cord. Findings are most consistent with a demyelinating process. 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 Motion degraded postcontrast imaging. No definite abnormal cord enhancement is identified. Electronically Signed   By: KKellie SimmeringDO   On: 03/07/2019 20:11       Assessment & Plan:    Active Problems:   Tremor  Numbness, tingling (parasthesia), weakness MRI c/w demyelinating disorder Check MRI lumbar spine Check b12, esr, ana, tsh, spep, upep, cpk, rpr, Ace level, hiv, spep, immunofixation Urinalysis Check neurolmyelitis optica IgG Check anti-myelin Assoc IgG Check acetylcholine ab Check LP (orders per neurology)  Neurology consulted by ED, appreciate input  + ANA, Sjogrens Anti SSA + Pt does note dry eyes and dry mouth on ROS Consider rheumatology consultation  Hyperlipidemia Cont Pravastatin 266mpo qhs  Anxiety/ Depression Cont Zoloft 5077mo qday Cotn Trazodone 89m87m qhs prn     DVT Prophylaxis-   Lovenox - SCDs AM Labs Ordered, also please review Full Orders  Family Communication: Admission, patients condition and plan of care including tests being ordered have been discussed with the patient  who  indicate understanding and agree with the plan and Code Status.  Code Status:  FULL CODE per patient, notified sister of admission to [facility]  Admission status: Observation: Based on patients clinical presentation and evaluation of above clinical data, I  have made determination that patient meets Observation criteria at this time.   Time spent in minutes : 70 minutes   Jani Gravel M.D on 03/07/2019 at 9:42 PM

## 2019-03-07 NOTE — Consult Note (Signed)
Neurology Consultation  Reason for Consult: Generalized weakness, dizziness Referring Physician: Dr. Anitra Lauth  CC: Generalized weakness, dizziness  History is obtained from: Patient  HPI: Abigail Lewis is a 29 y.o. female past medical history of anxiety and depression, presenting for evaluation of multiple neurological complaints, which started somewhere around September 2020. She reports that she started feeling generalized weakness starting somewhere around September 2020.  This progressed to the point where by the end of November 2020, she was using a walker to walk as she could not maintain her balance.  In the time from September till now, she is also had multiple other complaints-urinary retention, urinary incontinence at times, intermittent bowel incontinence.  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 She reports that she feels dizzy and lightheaded with no positional component to it. On chart review, she was seen in 2019 by Dr. Allena Katz for evaluation of paresthesias.  She had reported feeling of numbness in her hands and feet which were intermittent.  This was evaluated further by nerve conduction studies and EMG of the left leg which was the most affected to exclude neuropathy and was deemed that her symptoms were likely resultant of her anxiety. She does not report much in terms of tingling and numbness now but the most concerning symptoms are inability to use her legs and left arm clumsiness and weakness which has progressed from September 2022 now to a point which has crippled her and prevented her from doing her ADLs independently.  Denies current tingling or numbness.  Denies any episodes of shocks running down the spine when bending her neck.  Denies any episodes of blurred vision that lasted a few days to weeks before improving but reports intermittent momentary episodes of blurred vision that resolve on its own.   Denies any difficulty swallowing or breathing.   ROS:  ROS was performed and is negative except as noted in the HPI.[name] Kitchen   Past Medical History:  Diagnosis Date  . Anxiety   . Depression     Family History  Problem Relation Age of Onset  . Stroke Mother   . Diabetes Father   . Healthy Sister   . Autism Brother    Social History:   reports that she has never smoked. She has never used smokeless tobacco. She reports that she does not drink alcohol or use drugs.  Medications  Current Facility-Administered Medications:  .  0.9 %  sodium chloride infusion, 250 mL, Intravenous, PRN, Pearson Grippe, MD .  acetaminophen (TYLENOL) tablet 650 mg, 650 mg, Oral, Q6H PRN **OR** acetaminophen (TYLENOL) suppository 650 mg, 650 mg, Rectal, Q6H PRN, Pearson Grippe, MD .  enoxaparin (LOVENOX) injection 40 mg, 40 mg, Subcutaneous, Q24H, Pearson Grippe, MD .  sodium chloride flush (NS) 0.9 % injection 3 mL, 3 mL, Intravenous, Q12H, Pearson Grippe, MD .  sodium chloride flush (NS) 0.9 % injection 3 mL, 3 mL, Intravenous, PRN, Pearson Grippe, MD  Current Outpatient Medications:  .  pravastatin (PRAVACHOL) 20 MG tablet, Take 20 mg by mouth daily., Disp: , Rfl:  .  sertraline (ZOLOFT) 25 MG tablet, TAKE 1 TABLET BY MOUTH EVERY DAY (Patient taking differently: Take 25 mg by mouth daily. ), Disp: 30 tablet, Rfl: 0 .  traZODone (DESYREL) 50 MG tablet, TAKE 1 TABLET BY MOUTH AT BEDTIME AS NEEDED FOR SLEEP (Patient taking differently: Take 50 mg by mouth at bedtime as needed for sleep. ), Disp: 30 tablet,  Rfl: 0 .  acetaminophen (TYLENOL) 500 MG tablet, Take 2 tablets (1,000 mg total) by mouth every 8 (eight) hours as needed for moderate pain. (Patient not taking: Reported on 03/07/2019), Disp: 30 tablet, Rfl: 0   Exam: Current vital signs: BP 103/74   Pulse 83   Temp 98.3 F (36.8 C) (Oral)   Resp (!) 9   SpO2 98%  Vital signs in last 24 hours: Temp:  [98.3 F (36.8 C)] 98.3 F (36.8 C) (02/13 1529) Pulse Rate:   [76-83] 83 (02/13 1946) Resp:  [9-14] 9 (02/13 2045) BP: (103-126)/(73-98) 103/74 (02/13 2045) SpO2:  [98 %-100 %] 98 % (02/13 1946) General: Awake alert in no distress HEENT: Normocephalic atraumatic Lungs:Clear to auscultation Cardiovascular: Regular rhythm Abdomen: Nondistended nontender Extremities warm well perfused Neurological exam Awake alert oriented x3 No dysarthria No aphasia Cranial: Pupils are equal round reactive to light, extraocular movements are intact, no evidence of an APD, no nystagmus, facial sensation intact, facial symmetry preserved, auditory acuity intact, tongue and palate midline. Motor exam: Left upper extremity is 4/5 and has drift on keeping the arms up, right upper extremity is 4+/5 with no drift.  Left upper and lower extremity are both 4+/5 with mild drift. Sensory exam: Intact sensation grossly. Coordination: Left finger-nose-finger dysmetria noted.  Left heel-knee-shin dysmetria noted. Gait testing deferred at this time    Labs I have reviewed labs in epic and the results pertinent to this consultation are:  CBC    Component Value Date/Time   WBC 5.4 03/07/2019 1650   RBC 3.76 (L) 03/07/2019 1650   HGB 11.9 (L) 03/07/2019 1650   HCT 35.6 (L) 03/07/2019 1650   PLT 216 03/07/2019 1650   MCV 94.7 03/07/2019 1650   MCH 31.6 03/07/2019 1650   MCHC 33.4 03/07/2019 1650   RDW 12.4 03/07/2019 1650   LYMPHSABS 1.4 03/07/2019 1650   MONOABS 0.3 03/07/2019 1650   EOSABS 0.1 03/07/2019 1650   BASOSABS 0.0 03/07/2019 1650    CMP     Component Value Date/Time   NA 137 03/07/2019 1650   K 3.8 03/07/2019 1650   CL 103 03/07/2019 1650   CO2 24 03/07/2019 1650   GLUCOSE 86 03/07/2019 1650   BUN 11 03/07/2019 1650   CREATININE 0.72 03/07/2019 1650   CALCIUM 9.4 03/07/2019 1650   PROT 7.0 03/07/2019 1650   ALBUMIN 4.1 03/07/2019 1650   AST 11 (L) 03/07/2019 1650   ALT 12 03/07/2019 1650   ALKPHOS 38 03/07/2019 1650   BILITOT 0.6 03/07/2019  1650   GFRNONAA >60 03/07/2019 1650   GFRAA >60 03/07/2019 1650    Imaging I have reviewed the images obtained: MRI brain with without contrast reveals extensive white matter signal abnormality in the subcortical and juxtacortical along with the periventricular white matter in both cerebral hemisphere as well as the brainstem including the periaqueductal gray matter and the margin of the fourth ventricle rib as well as the cerebellar white matter and the right brachium pontis. There are numerous sites of corresponding enhancement with a bilateral cerebral hemispheric as well as the right cerebellar T2 hyperintensity and also enhancement around the fourth ventricle. There is probable focal abnormal enhancement in the intraorbital left optic nerve as well. Pattern consistent with demyelination. MR C-spine consistent with a longitudinally extensive multifocal spinal cord abnormality involving the upper thoracic and the cervical C-spine-consistent with demyelinating disorder. MS versus NMO.  Assessment:  Ms. Tello is a 29 year old woman with a past medical history of  anxiety and depression who presents for evaluation of a multitude of neurological complaints including some tingling and numbness that happened at the end of 2019 but seen September 2020 progressive difficulty walking, progressive weakness worse on the left arm and leg than right, and off-and-on blurred vision along with dizziness and vertiginous symptoms that have not responded to any symptomatic over-the-counter medications. 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 Other differentials include autoimmune diseases such as lupus and neurosarcoid. She was ANA and Sjogren's panel positive in November-repeat and possibly get rheumatological input as an outpatient as well.  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  Recommendations: #Obtain a chest x-ray and UA.  If negative, start the patient on IV Solu-Medrol 1000 mg IV x5 days. #Obtain NMO antibodies #Check ACE level, Lupus panel, B12 #LP - send CSF for glucose, protein, cell count, Gram stain, oligoclonal bands, IgG index, angiotensin-converting enzyme levels. #PT OT #Outpatient neurology with Dr. Olive Bass at [facility] neurology for decision on disease modifying therapy. #Also outpatient referral for rheumatology.  Discussed my plan with the ED APP K. Precious Gilding, and admitting hospitalist Dr. Pearson Grippe. The ED provider will perform a spinal tap at bedside in the emergency room. I have ordered the CSF studies to be performed.  -- Milon Dikes, MD [facility] Pager: [phone] If 7pm to 7am, please call on call as listed on AMION.

## 2019-03-07 NOTE — ED Provider Notes (Addendum)
[facility] EMERGENCY DEPARTMENT Provider Note   CSN: [id number] Arrival date & time: 03/07/19  1523   History No chief complaint on file.   Abigail Lewis is a 29 y.o. female with history of anxiety and depression who presents with multiple complaints. She states that she's had progressively worsening symptoms for about 1-2 months. She has been having increasing difficulty walking for the past 2 months. It has come to the point where she is walking with a walker. Since yesterday she has had trouble standing and states she can't stand more than 5 seconds. She also has had a worsening tremor in the L arm for the past month. She has problems with chronic dizziness with head movement. Was seen in the ED in October 2020 and then followed up with her family doctor in November and diagnosed with BPPV. She took Meclizine without relief. Due to persistent dizziness she did have a CT scan on 02/13/19 which was overall negative but did show some sinusitis so she was treated for that but symptoms still did not improve. Yesterday she also started to have trouble speaking and since she was having more mobility issues she decided to come to the ED. She reports associated intermittent blurry vision and also episodes of urinary incontinence when she is in public but more difficulty urinating at home. She denies any recent fever, headache, LOC, numbness/tingling, abdominal pain, N/V/D.   HPI     Past Medical History:  Diagnosis Date  . Anxiety   . Depression     Patient Active Problem List   Diagnosis Date Noted  . History of miscarriage 07/12/2016    Past Surgical History:  Procedure Laterality Date  . NO PAST SURGERIES       OB History    Gravida  2   Para      Term      Preterm      AB  1   Living  0     SAB  1   TAB      Ectopic      Multiple      Live Births              Family History  Problem Relation Age of Onset  . Stroke Mother   . Diabetes  Father   . Healthy Sister   . Autism Brother     Social History   Tobacco Use  . Smoking status: Never Smoker  . Smokeless tobacco: Never Used  Substance Use Topics  . Alcohol use: No  . Drug use: No    Home Medications Prior to Admission medications   Medication Sig Start Date End Date Taking? Authorizing Provider  pravastatin (PRAVACHOL) 20 MG tablet Take 20 mg by mouth daily. 02/28/19  Yes [provider]  sertraline (ZOLOFT) 25 MG tablet TAKE 1 TABLET BY MOUTH EVERY DAY Patient taking differently: Take 25 mg by mouth daily.  12/06/16  Yes Hairston, Oren Beckmann, FNP  traZODone (DESYREL) 50 MG tablet TAKE 1 TABLET BY MOUTH AT BEDTIME AS NEEDED FOR SLEEP Patient taking differently: Take 50 mg by mouth at bedtime as needed for sleep.  12/11/16  Yes Hairston, Oren Beckmann, FNP  acetaminophen (TYLENOL) 500 MG tablet Take 2 tablets (1,000 mg total) by mouth every 8 (eight) hours as needed for moderate pain. Patient not taking: Reported on 03/07/2019 12/30/15   Emi Holes, PA-C    Allergies    Penicillins  Review of Systems  Review of Systems  Constitutional: Negative for chills and fever.  Eyes: Positive for visual disturbance.  Respiratory: Negative for shortness of breath.   Gastrointestinal: Negative for abdominal pain, diarrhea, nausea and vomiting.  Genitourinary: Positive for difficulty urinating.       +urinary incontinence  Musculoskeletal: Negative for back pain and neck pain.  Neurological: Positive for dizziness, tremors, speech difficulty and weakness. Negative for syncope, numbness and headaches.  All other systems reviewed and are negative.   Physical Exam Updated Vital Signs BP 111/82 (BP Location: Left Arm)   Pulse 76   Temp 98.3 F (36.8 C) (Oral)   Resp 14   SpO2 98%   Physical Exam Vitals and nursing note reviewed.  Constitutional:      General: She is not in acute distress.    Appearance: Normal appearance. She is well-developed. She  is not ill-appearing.  HENT:     Head: Normocephalic and atraumatic.  Eyes:     General: No scleral icterus.       Right eye: No discharge.        Left eye: No discharge.     Conjunctiva/sclera: Conjunctivae normal.     Pupils: Pupils are equal, round, and reactive to light.  Cardiovascular:     Rate and Rhythm: Normal rate and regular rhythm.  Pulmonary:     Effort: Pulmonary effort is normal. No respiratory distress.     Breath sounds: Normal breath sounds.  Abdominal:     General: There is no distension.     Palpations: Abdomen is soft.     Tenderness: There is no abdominal tenderness.  Musculoskeletal:     Cervical back: Normal range of motion.  Skin:    General: Skin is warm and dry.  Neurological:     Mental Status: She is alert and oriented to person, place, and time.     Comments: Mental Status:  Alert, oriented, thought content appropriate, able to give a coherent history. Speech fluent without evidence of aphasia. Able to follow 2 step commands without difficulty.  Cranial Nerves:  II:  Peripheral visual fields grossly normal, pupils equal, round, reactive to light III,IV, VI: ptosis not present, extra-ocular motions intact bilaterally  V,VII: smile symmetric, facial light touch sensation equal VIII: hearing grossly normal to voice  X: uvula elevates symmetrically  XI: bilateral shoulder shrug symmetric and strong XII: midline tongue extension without fassiculations Motor:  Normal tone. 5/5 in upper and lower extremities bilaterally including strong and equal grip strength and dorsiflexion/plantar flexion Sensory: Pinprick and light touch normal in all extremities.  Deep Tendon Reflexes: 2+ and symmetric in the biceps and patella Cerebellar: Dysmetria on the left Gait: Able to stand at bedside with assistance from a walker but is shaky. Ambulation not attempted. CV: distal pulses palpable throughout    Psychiatric:        Behavior: Behavior normal.     ED  Results / Procedures / Treatments   Labs (all labs ordered are listed, but only abnormal results are displayed) Labs Reviewed  CBC WITH DIFFERENTIAL/PLATELET - Abnormal; Notable for the following components:      Result Value   RBC 3.76 (*)    Hemoglobin 11.9 (*)    HCT 35.6 (*)    All other components within normal limits  COMPREHENSIVE METABOLIC PANEL - Abnormal; Notable for the following components:   AST 11 (*)    All other components within normal limits  SARS CORONAVIRUS 2 (TAT 6-24 HRS)  CSF CULTURE  URINALYSIS, ROUTINE W REFLEX MICROSCOPIC  HIV ANTIBODY (ROUTINE TESTING W REFLEX)  COMPREHENSIVE METABOLIC PANEL  CBC  TSH  SEDIMENTATION RATE  VITAMIN B12  CK TOTAL AND CKMB (NOT AT [facility])  NEUROMYELITIS OPTICA AUTOAB, IGG  ANA W/REFLEX IF POSITIVE  ANTI-MYELIN ASSOC GLYCOP IGG  RPR  ANGIOTENSIN CONVERTING ENZYME, CSF  CSF CELL COUNT WITH DIFFERENTIAL  OLIGOCLONAL BANDS, CSF + SERM  DRAW EXTRA CLOT TUBE  IGG CSF INDEX  DRAW EXTRA CLOT TUBE  I-STAT BETA HCG BLOOD, ED ([facility], [facility], [facility] ONLY)    EKG None  Radiology DG Chest 2 View  Result Date: 03/07/2019 CLINICAL DATA:  Two months of leg weakness, concern for infection EXAM: CHEST - 2 VIEW COMPARISON:  Radiograph 04/11/2017 FINDINGS: No consolidation, features of edema, pneumothorax, or effusion. Pulmonary vascularity is normally distributed. The cardiomediastinal contours are unremarkable. No acute osseous or soft tissue abnormality. IMPRESSION: No acute cardiopulmonary abnormality. Electronically Signed   By: Kreg Shropshire M.D.   On: 03/07/2019 20:24   MR Brain W and Wo Contrast  Result Date: 03/07/2019 CLINICAL DATA:  Parkinson's disease. Per the ED physician, the patient has no history of Parkinson's disease the patient presents for 1 month of progressive weakness and neurological complaints. EXAM: MRI HEAD WITHOUT AND WITH CONTRAST TECHNIQUE: Multiplanar, multiecho pulse sequences of the brain and surrounding structures  were obtained without and with intravenous contrast. CONTRAST:  35mL GADAVIST GADOBUTROL 1 MMOL/ML IV SOLN COMPARISON:  No pertinent prior studies available for comparison. FINDINGS: Brain: Sagittal T1 weighted imaging is motion degraded. There is extensive multifocal T2/FLAIR hyperintense signal abnormality within the subcortical/juxtacortical and deep periventricular white matter as well as within the bilateral internal capsules, brainstem (including the periaqueductal gray), surrounding the fourth ventricle, within the cerebellar white matter and right brachium pontis. Numerous sites within the bilateral cerebral hemispheres demonstrate corresponding enhancement as well as sites within the right cerebellum and surrounding the fourth ventricle (see annotations on the axial and coronal postcontrast T1 weighted imaging). There is no evidence of acute infarct. No midline shift or extra-axial fluid collection. No chronic intracranial blood products. Vascular: Flow voids maintained within the proximal large arterial vessels. Skull and upper cervical spine: No focal marrow lesion. Sinuses/Orbits: Visualized orbits demonstrate no acute abnormality. Mild ethmoid sinus and right maxillary sinus mucosal thickening. No significant mastoid effusion. Probable small focus of abnormal enhancement within the intraorbital left optic nerve (series 26, image 18). These results were called by telephone at the time of interpretation on 03/07/2019 at 8:00 pm to provider Dr. Anitra Lauth, who verbally acknowledged these results. IMPRESSION: Extensive multifocal white matter signal abnormality involving the subcortical/juxtacortical and deep periventricular white matter within both cerebral hemispheres, the brainstem (including the periaqueductal gray), the margin of the fourth ventricle, as well as the cerebellar white matter and right brachium pontis. There are numerous sites of corresponding enhancement within the bilateral cerebral  hemispheres, as well as within the right cerebellum and surrounding the fourth ventricle. Additionally, there is probable focal abnormal enhancement of the intraorbital left optic nerve. Findings are most consistent with a demyelinating process with active demyelination. 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 Electronically Signed   By: Jackey Loge DO   On: 03/07/2019 20:05   MR Cervical Spine W or Wo Contrast  Result Date: 03/07/2019 CLINICAL DATA:  Neck pain, chronic, no prior imaging. EXAM: MRI CERVICAL SPINE WITHOUT AND WITH CONTRAST TECHNIQUE:  Multiplanar and multiecho pulse sequences of the cervical spine, to include the craniocervical junction and cervicothoracic junction, were obtained without and with intravenous contrast. CONTRAST:  74mL GADAVIST GADOBUTROL 1 MMOL/ML IV SOLN COMPARISON:  Concurrently performed brain MRI FINDINGS: Alignment: Straightening of the expected cervical lordosis. No significant spondylolisthesis. Vertebrae: Vertebral body height is maintained. No marrow edema or suspicious osseous lesion. Cord: There is extensive multifocal T2/STIR hyperintense signal abnormality within the cervical and visualized upper thoracic spinal cord. For instance, there is a large longitudinally extensive focus of cord signal abnormality spanning the C2-C4 levels (series 15, image 8). Longitudinally extensive involvement is also seen throughout much of the lower cervical and visualized upper thoracic cord (series 15, image 9). Postcontrast imaging is moderately motion degraded. Within this limitation, no definite abnormal cord enhancement is identified. Posterior Fossa, vertebral arteries, paraspinal tissues: Posterior fossa better assessed on concurrently performed brain MRI. The paraspinal soft tissues are within normal limits. Flow voids preserved within the  cervical vertebral arteries. Disc levels: Intervertebral disc height is preserved throughout the cervical spine. No significant disc herniation, spinal canal stenosis or neural foraminal narrowing. These results were called by telephone at the time of interpretation on 03/07/2019 at 8:10 pm to provider Dr. Anitra Lauth, who verbally acknowledged these results. IMPRESSION: Longitudinally extensive multifocal spinal cord signal abnormality involving the majority of the cervical and visualized upper thoracic spinal cord. Findings are most consistent with a demyelinating process. 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 Motion degraded postcontrast imaging. No definite abnormal cord enhancement is identified. Electronically Signed   By: Jackey Loge DO   On: 03/07/2019 20:11    Procedures .Lumbar Puncture  Date/Time: 03/07/2019 11:10 PM Performed by: Bethel Born, PA-C Authorized by: Bethel Born, PA-C   Consent:    Consent obtained:  Verbal   Consent given by:  Patient   Risks discussed:  Bleeding, infection, pain, headache and nerve damage   Alternatives discussed:  No treatment Pre-procedure details:    Procedure purpose:  Diagnostic   Preparation: Patient was prepped and draped in usual sterile fashion   Anesthesia (see MAR for exact dosages):    Anesthesia method:  Local infiltration   Local anesthetic:  Lidocaine 1% w/o epi Procedure details:    Lumbar space:  L4-L5 interspace   Patient position:  L lateral decubitus   Needle gauge:  18   Needle type:  Spinal needle - Quincke tip   Needle length (in):  5.0   Ultrasound guidance: no     Number of attempts:  1   Fluid appearance:  Clear   Tubes of fluid:  4   Total volume (ml):  4.5 Post-procedure:    Puncture site:  Adhesive bandage applied and direct pressure applied   Patient tolerance of  procedure:  Tolerated well, no immediate complications   (including critical care time)    Medications Ordered in ED Medications  gadobutrol (GADAVIST) 1 MMOL/ML injection 7 mL (7 mLs Intravenous Contrast Given 03/07/19 1920)    ED Course  I have reviewed the triage vital signs and the nursing notes.  Pertinent labs & imaging results that were available during my care of the patient were reviewed by me and considered in my medical decision making (see chart for details).  29 year old female presents with multiple neurologic symptoms that have been worsening over the past 2 months. She is generally weak, has  a L arm tremor on exam, and difficulty standing and walking. Vitals are normal. Case discussed with neurology - they recommends MRI brain and c-spine w/wo contrast.  MRI shows extensive active demyelenating process in the brain and cervical spinal cord. DDx is MS vs NMO. Consulted neurology again - they are recommending LP and CXR, UA to r/o any active infection prior to initiation of steroids. Labs are overall normal.  LP performed by me with supervision by Dr. Anitra Lauth. CSF appears clear. Pt tolerated without any difficulty.  Discussed with Dr. Selena Batten with [facility] who will admit. Neurology will follow   MDM Rules/Calculators/A&P                       Final Clinical Impression(s) / ED Diagnoses Final diagnoses:  Weakness of both lower extremities  Demyelinating disease [facility])    Rx / DC Orders ED Discharge Orders    None       Bethel Born, PA-C 03/08/19 0007    Bethel Born, PA-C 03/08/19 Pernell Dupre    Gwyneth Sprout, MD 03/08/19 1558

## 2019-03-07 NOTE — ED Triage Notes (Signed)
Patient complains of various neuro complaints x 1 month that started with left hand shaking. Now complains of slurring her speech and trouble weight bearing x 2 days. Alert and oriented, denies trauma. Patient now using walker x 1 month.

## 2019-03-08 ENCOUNTER — Observation Stay (HOSPITAL_COMMUNITY): Payer: 59

## 2019-03-08 DIAGNOSIS — R202 Paresthesia of skin: Secondary | ICD-10-CM | POA: Diagnosis not present

## 2019-03-08 DIAGNOSIS — G379 Demyelinating disease of central nervous system, unspecified: Secondary | ICD-10-CM | POA: Diagnosis present

## 2019-03-08 DIAGNOSIS — K029 Dental caries, unspecified: Secondary | ICD-10-CM | POA: Diagnosis present

## 2019-03-08 DIAGNOSIS — Z880 Allergy status to penicillin: Secondary | ICD-10-CM | POA: Diagnosis not present

## 2019-03-08 DIAGNOSIS — F329 Major depressive disorder, single episode, unspecified: Secondary | ICD-10-CM | POA: Diagnosis present

## 2019-03-08 DIAGNOSIS — Z823 Family history of stroke: Secondary | ICD-10-CM | POA: Diagnosis not present

## 2019-03-08 DIAGNOSIS — Z833 Family history of diabetes mellitus: Secondary | ICD-10-CM | POA: Diagnosis not present

## 2019-03-08 DIAGNOSIS — H04123 Dry eye syndrome of bilateral lacrimal glands: Secondary | ICD-10-CM | POA: Diagnosis present

## 2019-03-08 DIAGNOSIS — E785 Hyperlipidemia, unspecified: Secondary | ICD-10-CM | POA: Diagnosis present

## 2019-03-08 DIAGNOSIS — F419 Anxiety disorder, unspecified: Secondary | ICD-10-CM | POA: Diagnosis present

## 2019-03-08 DIAGNOSIS — T380X5A Adverse effect of glucocorticoids and synthetic analogues, initial encounter: Secondary | ICD-10-CM | POA: Diagnosis not present

## 2019-03-08 DIAGNOSIS — R200 Anesthesia of skin: Secondary | ICD-10-CM

## 2019-03-08 DIAGNOSIS — R251 Tremor, unspecified: Secondary | ICD-10-CM | POA: Diagnosis not present

## 2019-03-08 DIAGNOSIS — K047 Periapical abscess without sinus: Secondary | ICD-10-CM | POA: Diagnosis present

## 2019-03-08 DIAGNOSIS — R682 Dry mouth, unspecified: Secondary | ICD-10-CM | POA: Diagnosis present

## 2019-03-08 DIAGNOSIS — G35 Multiple sclerosis: Secondary | ICD-10-CM | POA: Diagnosis present

## 2019-03-08 DIAGNOSIS — R739 Hyperglycemia, unspecified: Secondary | ICD-10-CM | POA: Diagnosis not present

## 2019-03-08 DIAGNOSIS — D72829 Elevated white blood cell count, unspecified: Secondary | ICD-10-CM | POA: Diagnosis present

## 2019-03-08 DIAGNOSIS — Z20822 Contact with and (suspected) exposure to covid-19: Secondary | ICD-10-CM | POA: Diagnosis present

## 2019-03-08 DIAGNOSIS — R768 Other specified abnormal immunological findings in serum: Secondary | ICD-10-CM | POA: Diagnosis not present

## 2019-03-08 LAB — CBC
HCT: 34.6 % — ABNORMAL LOW (ref 36.0–46.0)
Hemoglobin: 11.7 g/dL — ABNORMAL LOW (ref 12.0–15.0)
MCH: 31.6 pg (ref 26.0–34.0)
MCHC: 33.8 g/dL (ref 30.0–36.0)
MCV: 93.5 fL (ref 80.0–100.0)
Platelets: 206 10*3/uL (ref 150–400)
RBC: 3.7 MIL/uL — ABNORMAL LOW (ref 3.87–5.11)
RDW: 12.5 % (ref 11.5–15.5)
WBC: 3.9 10*3/uL — ABNORMAL LOW (ref 4.0–10.5)
nRBC: 0 % (ref 0.0–0.2)

## 2019-03-08 LAB — COMPREHENSIVE METABOLIC PANEL
ALT: 12 U/L (ref 0–44)
AST: 14 U/L — ABNORMAL LOW (ref 15–41)
Albumin: 3.6 g/dL (ref 3.5–5.0)
Alkaline Phosphatase: 40 U/L (ref 38–126)
Anion gap: 6 (ref 5–15)
BUN: 7 mg/dL (ref 6–20)
CO2: 25 mmol/L (ref 22–32)
Calcium: 9.3 mg/dL (ref 8.9–10.3)
Chloride: 106 mmol/L (ref 98–111)
Creatinine, Ser: 0.66 mg/dL (ref 0.44–1.00)
GFR calc Af Amer: >60 mL/min (ref >60)
GFR calc non Af Amer: >60 mL/min (ref >60)
Glucose, Bld: 78 mg/dL (ref 70–99)
Potassium: 3.7 mmol/L (ref 3.5–5.1)
Sodium: 137 mmol/L (ref 135–145)
Total Bilirubin: 0.7 mg/dL (ref 0.3–1.2)
Total Protein: 6.5 g/dL (ref 6.5–8.1)

## 2019-03-08 LAB — URINALYSIS, ROUTINE W REFLEX MICROSCOPIC
Bilirubin Urine: NEGATIVE
Glucose, UA: NEGATIVE mg/dL
Hgb urine dipstick: NEGATIVE
Ketones, ur: NEGATIVE mg/dL
Leukocytes,Ua: NEGATIVE
Nitrite: NEGATIVE
Protein, ur: NEGATIVE mg/dL
Specific Gravity, Urine: 1.025 (ref 1.005–1.030)
pH: 6 (ref 5.0–8.0)

## 2019-03-08 LAB — SEDIMENTATION RATE: Sed Rate: 5 mm/hr (ref 0–22)

## 2019-03-08 LAB — CSF CELL COUNT WITH DIFFERENTIAL
RBC Count, CSF: 1 /mm3 — ABNORMAL HIGH
Tube #: 3
WBC, CSF: 6 /mm3 — ABNORMAL HIGH (ref 0–5)

## 2019-03-08 LAB — CK TOTAL AND CKMB (NOT AT ARMC)
CK, MB: 0.2 ng/mL — ABNORMAL LOW (ref 0.5–5.0)
Relative Index: INVALID (ref 0.0–2.5)
Total CK: 35 U/L — ABNORMAL LOW (ref 38–234)

## 2019-03-08 LAB — RPR: RPR Ser Ql: NONREACTIVE

## 2019-03-08 LAB — VITAMIN B12: Vitamin B-12: 894 pg/mL (ref 180–914)

## 2019-03-08 LAB — HIV ANTIBODY (ROUTINE TESTING W REFLEX): HIV Screen 4th Generation wRfx: NONREACTIVE

## 2019-03-08 LAB — PROTEIN AND GLUCOSE, CSF
Glucose, CSF: 49 mg/dL (ref 40–70)
Total  Protein, CSF: 51 mg/dL — ABNORMAL HIGH (ref 15–45)

## 2019-03-08 LAB — TSH: TSH: 2.212 u[IU]/mL (ref 0.350–4.500)

## 2019-03-08 LAB — SARS CORONAVIRUS 2 (TAT 6-24 HRS): SARS Coronavirus 2: NEGATIVE

## 2019-03-08 IMAGING — DX DG ORTHOPANTOGRAM /PANORAMIC
1 series · 1 of 1 positions shown · non-contrast
Comparison: None.

CLINICAL DATA: Tooth pain

EXAM:
ORTHOPANTOGRAM/PANORAMIC

[view not recorded]
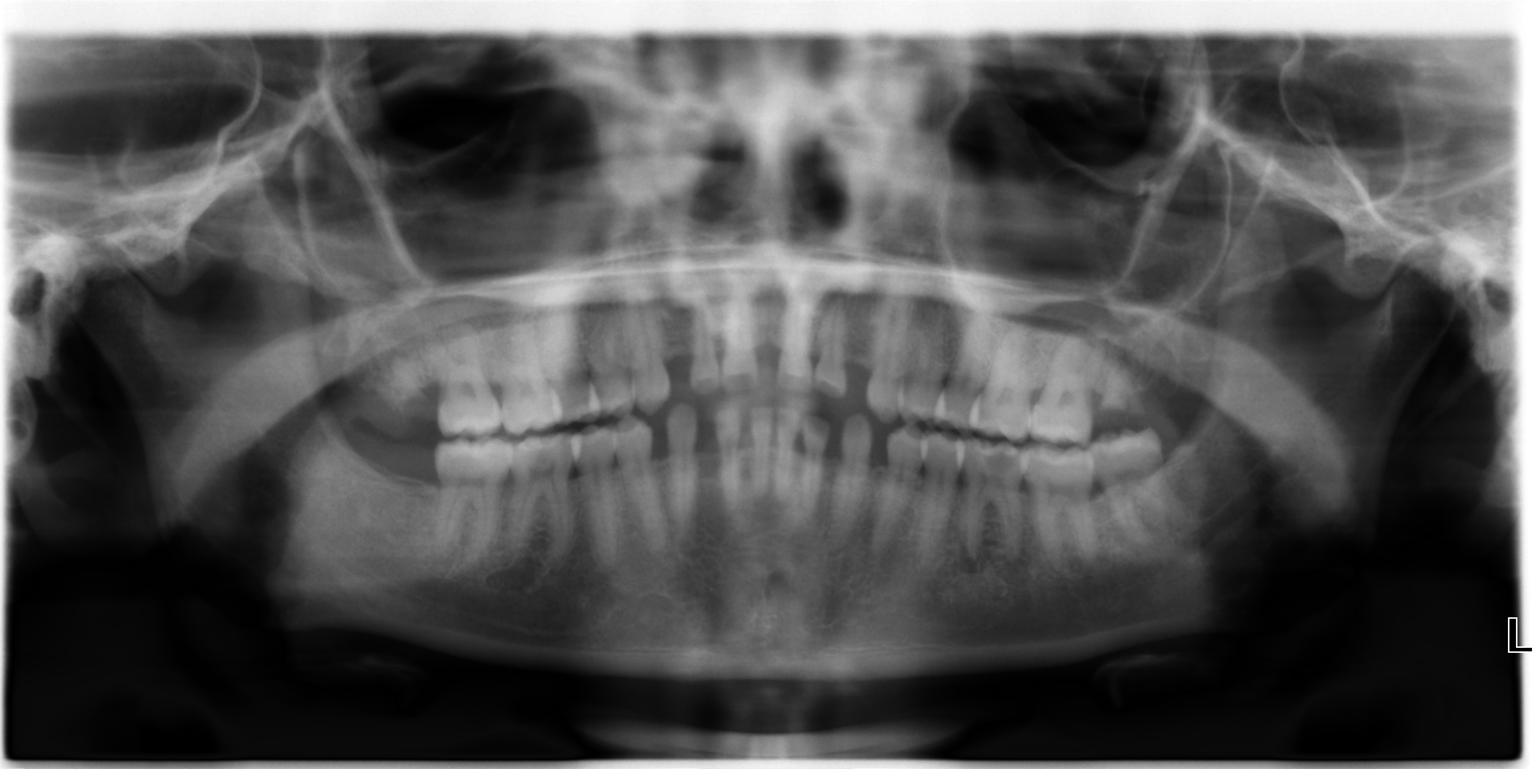

[1 of 1 positions shown; findings below may reference images not displayed]

FINDINGS: Dental caries are noted most marked within the third molar within
the maxilla on the left. Other scattered caries are noted. Mild
periapical lucency is noted in the left first mandibular molar.
Caries are noted within this tooth as well. This could represent an
early periapical abscess. No other focal abnormality is noted.
IMPRESSION: Diffuse dental caries.

Periapical lucency involving the left third mandibular molar as
described.

## 2019-03-08 MED ORDER — SODIUM CHLORIDE 0.9 % IV SOLN
1000.0000 mg | Freq: Every day | INTRAVENOUS | Status: AC
  Administered 2019-03-08 – 2019-03-12 (×5): 1000 mg via INTRAVENOUS
  Filled 2019-03-08 (×5): qty 8

## 2019-03-08 MED ORDER — PANTOPRAZOLE SODIUM 40 MG PO TBEC
40.0000 mg | DELAYED_RELEASE_TABLET | Freq: Every day | ORAL | Status: DC
  Administered 2019-03-08 – 2019-03-12 (×5): 40 mg via ORAL
  Filled 2019-03-08 (×5): qty 1

## 2019-03-08 MED ORDER — CLINDAMYCIN HCL 300 MG PO CAPS
300.0000 mg | ORAL_CAPSULE | Freq: Three times a day (TID) | ORAL | Status: DC
  Administered 2019-03-08 – 2019-03-12 (×12): 300 mg via ORAL
  Filled 2019-03-08 (×14): qty 1

## 2019-03-08 MED ORDER — RISAQUAD PO CAPS
1.0000 | ORAL_CAPSULE | Freq: Every day | ORAL | Status: DC
  Administered 2019-03-08 – 2019-03-12 (×5): 1 via ORAL
  Filled 2019-03-08 (×5): qty 1

## 2019-03-08 NOTE — ED Notes (Signed)
Attempted to call report to [facility]W 

## 2019-03-08 NOTE — Progress Notes (Addendum)
Progress Note    DRU LAUREL  EKC:[id number] DOB: 1990/04/20  DOA: 03/07/2019 PCP: [facility]    Brief Narrative:     Medical records reviewed and are as summarized below:  Abigail Lewis is an 29 y.o. female with a past medical history of anxiety and depression who presents for evaluation of a multitude of neurological complaints including some tingling and numbness that happened at the end of 2019 but seen September 2020 progressive difficulty walking, progressive weakness worse on the left arm and leg than right, and off-and-on blurred vision along with dizziness and vertiginous symptoms that have not responded to any symptomatic over-the-counter medications. 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 Other differentials include autoimmune diseases such as lupus and neurosarcoid. She was ANA and Sjogren's panel positive in November-repeat and possibly get rheumatological input as an outpatient as well.  Assessment/Plan:   Principal Problem:   Numbness and tingling Active Problems:   Tremor   Hyperlipidemia   Anxiety   Numbness, tingling (parasthesia), weakness:  MRI c/w demyelinating disorder Status post lumbar puncture: Multiple labs pending Urinalysis without signs of infection;Chest x-ray clear Will start IV steroids as recommended by Dr. Wilford Corner at 1000 mg IV daily x5 days Check neurolmyelitis optica IgG Check anti-myelin Assoc IgG Check  acetylcholine ab Neurology consult appreciated PT and OT  + ANA, Sjogrens Anti SSA + Pt does note dry eyes and dry mouth on ROS -outpatient rheumatology consultation  Hyperlipidemia Cont Pravastatin 20mg  po qhs  Anxiety/ Depression Cont Zoloft 50mg  po qday Cotn Trazodone 50mg  po qhs prn    Late entry: patient reported to neurology that she has a "mouth abscess":  Will get pantogram and start antibiotics.  Unfortunately patient is allergic to penicillin so will use clindamycin for now.  May need dental consult while in-house as she is going to be on high dose of IV steroids  Family Communication/Anticipated D/C date and plan/Code Status   DVT prophylaxis: SCDs, will probably need Lovenox as most likely will be here for 5 days Code Status: Full Code.  Family Communication:  Disposition Plan: Today is day 1 of 5 days of IV steroids per neurology   Medical Consultants:    Neurology.     Subjective:   Patient denies any new symptoms  Objective:    Vitals:   03/07/19 2130 03/07/19 2327 03/08/19 0000 03/08/19 0153  BP: 102/72 98/66 94/72  105/75  Pulse:  80  75  Resp: 15 18 17 12   Temp:    98.2 F (36.8 C)  TempSrc:    Oral  SpO2:  100%  100%  Weight:    65.7 kg   No intake or output data in the 24 hours ending 03/08/19 0959 Filed Weights   03/08/19 0153  Weight: 65.7 kg    Exam: In bed, no acute distress Alert and oriented x3 Regular rate and rhythm Clear to auscultation no wheezing Positive bowel sounds soft nontender   Data Reviewed:   I have personally reviewed following labs and imaging studies:  Labs:  Labs show the following:   Basic Metabolic Panel: Recent Labs  Lab 03/07/19 1650 03/08/19 0513  NA 137 137  K 3.8 3.7  CL 103 106  CO2 24 25  GLUCOSE 86 78  BUN 11 7  CREATININE 0.72 0.66  CALCIUM 9.4 9.3   GFR CrCl cannot be calculated (Unknown ideal weight.). Liver Function Tests: Recent Labs  Lab 03/07/19 1650 03/08/19 0513    AST 11* 14*  ALT 12 12  ALKPHOS 38 40  BILITOT 0.6 0.7  PROT 7.0 6.5  ALBUMIN 4.1 3.6   No results for input(s): LIPASE, AMYLASE in the last 168 hours. No results for input(s): AMMONIA in the last 168 hours. Coagulation profile No results for input(s): INR, PROTIME in the last 168 hours.  CBC: Recent Labs  Lab 03/07/19 1650 03/08/19 0513  WBC 5.4 3.9*  NEUTROABS 3.6  --   HGB 11.9* 11.7*  HCT 35.6* 34.6*  MCV 94.7 93.5  PLT 216 206   Cardiac Enzymes: Recent Labs  Lab 03/08/19 0513  CKTOTAL 35*  CKMB 0.2*   BNP (last 3 results) No results for input(s): PROBNP in the last 8760 hours. CBG: No results for input(s): GLUCAP in the last 168 hours. D-Dimer: No results for input(s): DDIMER in the last 72 hours. Hgb A1c: No results for input(s): HGBA1C in the last 72 hours. Lipid Profile: No results for input(s): CHOL, HDL, LDLCALC, TRIG, CHOLHDL, LDLDIRECT in the last 72 hours. Thyroid function studies: Recent Labs    03/08/19 0513  TSH 2.212   Anemia work up: No results for input(s): VITAMINB12, FOLATE, FERRITIN, TIBC, IRON, RETICCTPCT in the last 72 hours. Sepsis Labs: Recent Labs  Lab 03/07/19 1650 03/08/19 0513  WBC 5.4 3.9*    Microbiology Recent Results (from the past 240 hour(s))  SARS CORONAVIRUS 2 (TAT 6-24 HRS) Nasopharyngeal Nasopharyngeal Swab     Status: None   Collection Time: 03/07/19  8:50 PM   Specimen: Nasopharyngeal Swab  Result Value Ref Range Status   SARS Coronavirus 2 NEGATIVE NEGATIVE Final    Comment: (NOTE) SARS-CoV-2 target nucleic acids are NOT DETECTED. The SARS-CoV-2 RNA is generally detectable in upper and lower respiratory specimens during the acute phase of infection. Negative results do not preclude SARS-CoV-2 infection, do not rule out co-infections with other pathogens, and should not be used as the sole basis for treatment or other patient management decisions. Negative results must be combined with clinical  observations, patient history, and epidemiological information. The expected result is Negative. Fact Sheet for Patients: SugarRoll.be Fact Sheet for Healthcare Providers: https://www.woods-mathews.com/ This test is not yet approved or cleared by the [place] FDA and  has been authorized for detection and/or diagnosis of SARS-CoV-2 by FDA under an Emergency Use Authorization (EUA). This EUA will remain  in effect (meaning this test can be used) for the duration of the COVID-19 declaration under Section 56 4(b)(1) of the Act, 21 U.S.C. section 360bbb-3(b)(1), unless the authorization is terminated or revoked sooner. Performed at [facility], [place] [address] [place], [place] [address]   CSF culture     Status: None (Preliminary result)   Collection Time: 03/07/19 11:53 PM   Specimen: CSF; Cerebrospinal Fluid  Result Value Ref Range Status   Specimen Description CSF  Final   Special Requests TUBE 2  Final   Gram Stain   Final    CYTOSPIN SMEAR WBC PRESENT, PREDOMINANTLY MONONUCLEAR NO ORGANISMS SEEN Performed at [facility], [place]  [address], [place], [place] [address]    Culture PENDING  Incomplete   Report Status PENDING  Incomplete    Procedures and diagnostic studies:  DG Chest 2 View  Result Date: 03/07/2019 CLINICAL DATA:  Two months of leg weakness, concern for infection EXAM: CHEST - 2 VIEW COMPARISON:  Radiograph 04/11/2017 FINDINGS: No consolidation, features of edema, pneumothorax, or effusion. Pulmonary vascularity is normally distributed. The cardiomediastinal contours are unremarkable. No acute osseous or soft tissue abnormality. IMPRESSION: No acute cardiopulmonary abnormality. Electronically Signed   By: Kreg Shropshire M.D.   On: 03/07/2019 20:24   MR Brain W and Wo Contrast  Result Date: 03/07/2019 CLINICAL DATA:  Parkinson's disease. Per the ED physician, the patient has no history of Parkinson's disease  the patient presents for 1 month of progressive weakness and neurological complaints. EXAM: MRI HEAD WITHOUT AND WITH CONTRAST TECHNIQUE: Multiplanar, multiecho pulse sequences of the brain and surrounding structures were obtained without and with intravenous contrast. CONTRAST:  59mL GADAVIST GADOBUTROL 1 MMOL/ML IV SOLN COMPARISON:  No pertinent prior studies available for comparison. FINDINGS: Brain: Sagittal T1 weighted imaging is motion degraded. There is extensive multifocal T2/FLAIR hyperintense signal abnormality within the subcortical/juxtacortical and deep periventricular white matter as well as within the bilateral internal capsules, brainstem (including the periaqueductal gray), surrounding the fourth ventricle, within the cerebellar white matter and right brachium pontis. Numerous sites within the bilateral cerebral hemispheres demonstrate corresponding enhancement as well as sites within the right cerebellum and surrounding the fourth ventricle (see annotations on the axial and coronal postcontrast T1 weighted imaging). There is no evidence of acute infarct. No midline shift or extra-axial fluid collection. No chronic intracranial blood products. Vascular: Flow voids maintained within the proximal large arterial vessels. Skull and upper cervical spine: No focal marrow lesion. Sinuses/Orbits: Visualized orbits demonstrate no acute abnormality. Mild ethmoid sinus and right maxillary sinus mucosal thickening. No significant mastoid effusion. Probable small focus of abnormal enhancement within the intraorbital left optic nerve (series 26, image 18). These results were called by telephone at the time of interpretation on 03/07/2019 at 8:00 pm to provider Dr. Anitra Lauth, who verbally acknowledged these results. IMPRESSION: Extensive multifocal white matter signal abnormality involving the subcortical/juxtacortical and deep periventricular white matter within both cerebral hemispheres, the brainstem (including  the periaqueductal gray), the margin of the fourth ventricle, as well as the cerebellar white matter and right brachium pontis. There are numerous sites of corresponding enhancement within the bilateral cerebral hemispheres, as well as within the right cerebellum and surrounding the fourth ventricle. Additionally, there is probable focal abnormal enhancement of the intraorbital left optic nerve. Findings are most consistent with a demyelinating process with active demyelination. 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 Electronically Signed   By: Jackey Loge DO   On: 03/07/2019 20:05   MR Cervical Spine W or Wo Contrast  Result Date: 03/07/2019 CLINICAL DATA:  Neck pain, chronic, no prior imaging. EXAM: MRI CERVICAL SPINE WITHOUT AND WITH CONTRAST TECHNIQUE: Multiplanar and multiecho pulse sequences of the cervical spine, to include the craniocervical junction and cervicothoracic junction, were obtained without and with intravenous contrast. CONTRAST:  47mL GADAVIST GADOBUTROL 1 MMOL/ML IV SOLN COMPARISON:  Concurrently performed brain MRI FINDINGS: Alignment: Straightening of the expected cervical lordosis. No significant spondylolisthesis. Vertebrae: Vertebral body height is maintained. No marrow edema or suspicious osseous lesion. Cord: There is extensive multifocal T2/STIR hyperintense signal abnormality  within the cervical and visualized upper thoracic spinal cord. For instance, there is a large longitudinally extensive focus of cord signal abnormality spanning the C2-C4 levels (series 15, image 8). Longitudinally extensive involvement is also seen throughout much of the lower cervical and visualized upper thoracic cord (series 15, image 9). Postcontrast imaging is moderately motion degraded. Within this limitation, no definite abnormal cord enhancement is identified.  Posterior Fossa, vertebral arteries, paraspinal tissues: Posterior fossa better assessed on concurrently performed brain MRI. The paraspinal soft tissues are within normal limits. Flow voids preserved within the cervical vertebral arteries. Disc levels: Intervertebral disc height is preserved throughout the cervical spine. No significant disc herniation, spinal canal stenosis or neural foraminal narrowing. These results were called by telephone at the time of interpretation on 03/07/2019 at 8:10 pm to provider Dr. Anitra Lauth, who verbally acknowledged these results. IMPRESSION: Longitudinally extensive multifocal spinal cord signal abnormality involving the majority of the cervical and visualized upper thoracic spinal cord. Findings are most consistent with a demyelinating process. 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 Motion degraded postcontrast imaging. No definite abnormal cord enhancement is identified. Electronically Signed   By: Jackey Loge DO   On: 03/07/2019 20:11    Medications:   . enoxaparin (LOVENOX) injection  40 mg Subcutaneous Q24H  . pravastatin  20 mg Oral Daily  . sertraline  50 mg Oral Daily  . sodium chloride flush  3 mL Intravenous Q12H   Continuous Infusions: . sodium chloride    . methylPREDNISolone (SOLU-MEDROL) injection 1,000 mg (03/08/19 0940)     LOS: 0 days   Joseph Art  [facility]   How to contact the [facility] Attending or Consulting provider 7A - 7P or covering provider during after hours 7P -7A, for this patient?  1. Check the care team in [facility] and look for a) attending/consulting [facility] provider listed and b) the [facility] team listed 2. Log into [email or web address] and use [place]'s universal password to access. If you do not have the password, please contact the hospital operator. 3. Locate the [facility] provider you are looking for  under [facility] and page to a number that you can be directly reached. 4. If you still have difficulty reaching the provider, please page the [facility] (Director on Call) for the Hospitalists listed on amion for assistance.  03/08/2019, 9:59 AM

## 2019-03-08 NOTE — Progress Notes (Addendum)
NEURO HOSPITALIST PROGRESS NOTE   Subjective: Patient awake, alert, in bed, NAD. Ask good questions. Did c/o known abscess in left side of mouth for which she has a dental appointment.  Exam: Vitals:   03/08/19 0000 03/08/19 0153  BP: 94/72 105/75  Pulse:  75  Resp: 17 12  Temp:  98.2 F (36.8 C)  SpO2:  100%    Physical Exam  Constitutional: Appears well-developed and well-nourished.  Psych: Affect appropriate to situation Eyes: Normal external eye and conjunctiva. HENT: Normocephalic, no lesions, without obvious abnormality.   Musculoskeletal-no joint tenderness, deformity or swelling Cardiovascular: Normal rate and regular rhythm.  Respiratory: Effort normal, non-labored breathing saturations WNL on RA GI: Soft.  No distension. There is no tenderness.  Skin: WDI   Neuro:  Mental Status: Alert, oriented, able to follow commands. No aphasia or dysarthria noted.  Cranial Nerves: VFF, EOEMI, PERRL. Tongue protrudes midline. Uvula rises midline. Hearing intact. Bilateral shoulder shrug intact Motor: Right : Upper extremity   4/5 Left:     Upper extremity   4/5  Lower extremity   4/5  Lower extremity   4/5  Tone and bulk:normal tone throughout; no atrophy noted Sensory: Pinprick and light touch intact throughout, bilaterally Decreased vibratory sense BLE with left > than right.proprioception impaired on RLE Deep Tendon Reflexes: 2+ and symmetric biceps, patella Plantars: Right: downgoing   Left: downgoing Cerebellar: LUE with dysmetria with FNF LLE dysmetria HTS Gait: deferred    Medications:  Scheduled: . enoxaparin (LOVENOX) injection  40 mg Subcutaneous Q24H  . pravastatin  20 mg Oral Daily  . sertraline  50 mg Oral Daily  . sodium chloride flush  3 mL Intravenous Q12H   Continuous: . sodium chloride    . methylPREDNISolone (SOLU-MEDROL) injection 1,000 mg (03/08/19 0940)   JKD:TOIZTI chloride, acetaminophen **OR** acetaminophen,  sodium chloride flush, traZODone  Pertinent Labs/Diagnostics: CSF labs: pending NMO antibody labs pending B12:WNL, TSH: WNL Ua: negative  DG Chest 2 View  Result Date: 03/07/2019 CLINICAL DATA:  Two months of leg weakness, concern for infection EXAM: CHEST - 2 VIEW COMPARISON:  Radiograph 04/11/2017 FINDINGS: No consolidation, features of edema, pneumothorax, or effusion. Pulmonary vascularity is normally distributed. The cardiomediastinal contours are unremarkable. No acute osseous or soft tissue abnormality. IMPRESSION: No acute cardiopulmonary abnormality. Electronically Signed   By: Lovena Le M.D.   On: 03/07/2019 20:24   MR Brain W and Wo Contrast  Result Date: 03/07/2019 CLINICAL DATA:  Parkinson's disease. Per the ED physician, the patient has no history of Parkinson's disease the patient presents for 1 month of progressive weakness and neurological complaints. EXAM: MRI HEAD WITHOUT AND WITH CONTRAST TECHNIQUE: Multiplanar, multiecho pulse sequences of the brain and surrounding structures were obtained without and with intravenous contrast. CONTRAST:  69mL GADAVIST GADOBUTROL 1 MMOL/ML IV SOLN COMPARISON:  No pertinent prior studies available for comparison. FINDINGS: Brain: Sagittal T1 weighted imaging is motion degraded. There is extensive multifocal T2/FLAIR hyperintense signal abnormality within the subcortical/juxtacortical and deep periventricular white matter as well as within the bilateral internal capsules, brainstem (including the periaqueductal gray), surrounding the fourth ventricle, within the cerebellar white matter and right brachium pontis. Numerous sites within the bilateral cerebral hemispheres demonstrate corresponding enhancement as well as sites within the right cerebellum and surrounding the fourth ventricle (see annotations on the axial and coronal postcontrast T1 weighted imaging). There is  no evidence of acute infarct. No midline shift or extra-axial fluid collection.  No chronic intracranial blood products. Vascular: Flow voids maintained within the proximal large arterial vessels. Skull and upper cervical spine: No focal marrow lesion. Sinuses/Orbits: Visualized orbits demonstrate no acute abnormality. Mild ethmoid sinus and right maxillary sinus mucosal thickening. No significant mastoid effusion. Probable small focus of abnormal enhancement within the intraorbital left optic nerve (series 26, image 18). These results were called by telephone at the time of interpretation on 03/07/2019 at 8:00 pm to provider Dr. Anitra Lauth, who verbally acknowledged these results. IMPRESSION: Extensive multifocal white matter signal abnormality involving the subcortical/juxtacortical and deep periventricular white matter within both cerebral hemispheres, the brainstem (including the periaqueductal gray), the margin of the fourth ventricle, as well as the cerebellar white matter and right brachium pontis. There are numerous sites of corresponding enhancement within the bilateral cerebral hemispheres, as well as within the right cerebellum and surrounding the fourth ventricle. Additionally, there is probable focal abnormal enhancement of the intraorbital left optic nerve. Findings are most consistent with a demyelinating process with active demyelination. 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 Electronically Signed   By: Jackey Loge DO   On: 03/07/2019 20:05   MR Cervical Spine W or Wo Contrast  Result Date: 03/07/2019 CLINICAL DATA:  Neck pain, chronic, no prior imaging. EXAM: MRI CERVICAL SPINE WITHOUT AND WITH CONTRAST TECHNIQUE: Multiplanar and multiecho pulse sequences of the cervical spine, to include the craniocervical junction and cervicothoracic junction, were obtained without and with intravenous contrast. CONTRAST:  71mL GADAVIST GADOBUTROL 1  MMOL/ML IV SOLN COMPARISON:  Concurrently performed brain MRI FINDINGS: Alignment: Straightening of the expected cervical lordosis. No significant spondylolisthesis. Vertebrae: Vertebral body height is maintained. No marrow edema or suspicious osseous lesion. Cord: There is extensive multifocal T2/STIR hyperintense signal abnormality within the cervical and visualized upper thoracic spinal cord. For instance, there is a large longitudinally extensive focus of cord signal abnormality spanning the C2-C4 levels (series 15, image 8). Longitudinally extensive involvement is also seen throughout much of the lower cervical and visualized upper thoracic cord (series 15, image 9). Postcontrast imaging is moderately motion degraded. Within this limitation, no definite abnormal cord enhancement is identified. Posterior Fossa, vertebral arteries, paraspinal tissues: Posterior fossa better assessed on concurrently performed brain MRI. The paraspinal soft tissues are within normal limits. Flow voids preserved within the cervical vertebral arteries. Disc levels: Intervertebral disc height is preserved throughout the cervical spine. No significant disc herniation, spinal canal stenosis or neural foraminal narrowing. These results were called by telephone at the time of interpretation on 03/07/2019 at 8:10 pm to provider Dr. Anitra Lauth, who verbally acknowledged these results. IMPRESSION: Longitudinally extensive multifocal spinal cord signal abnormality involving the majority of the cervical and visualized upper thoracic spinal cord. Findings are most consistent with a demyelinating process. 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 Motion degraded postcontrast imaging. No definite abnormal cord enhancement is identified. Electronically Signed   By: Jackey Loge DO   On: 03/07/2019 20:11   Assessment:   29 year old female with PMH anxiety, depression who presented to evaluation of multiple neurological symptoms.  She was ANA and mildly positive ENA panel for Sjogren's ?  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 Epimenio Foot [facility] in 1-2 weeks on discharge -- Follow-up serum NMO ab  -- Follow-up remaining CSF studies -- continue steroids for full 5 day course; today is 1/5 --PT/OT -- outpatient rheumatology referral -- defer oral abscess managment to primary team.  Valentina Lucks, MSN, NP-C [facility] [phone]  Attending neurologist's note to follow   03/08/2019, 9:39 AM   NEUROHOSPITALIST ADDENDUM Performed a face to face diagnostic evaluation.   I have reviewed the contents of history and physical exam as documented by PA/ARNP/Resident and agree with above documentation.  I have discussed and formulated the above plan as documented. Edits to the note have been made as needed.   29 year old female, with about 6 months progressive dizziness, worsening gait, tremor of the left arm since 1 month presents the emergency department.  Denies history of sudden loss of vision or current visual problems.  Patient has significant ataxia in the left upper extremity greater than the right.  Rest of her exam findings mentioned in note.   I am concerned she either has primary progressive MS versus NMO vs relapsing remitting MS. She does have acute enhancing lesions on the MRI brain-most notable in the right cerebellum and fourth ventricle.  Additionally there is a focus of enhancement in the intraorbital left optic nerve.  She will need 5 days of IV steroids, we will need to wait for NMO ab.  If present will need to be initiated on rituximab.  Neurology will continue to follow     Georgiana Spinner Ahmari Duerson MD [facility] [phone]   If 7pm to 7am, please call on call as listed on AMION.

## 2019-03-08 NOTE — Evaluation (Signed)
Physical Therapy Evaluation Patient Details Name: NELLIE PESTER MRN: [id number] DOB: 02/13/1990 Today's Date: 03/08/2019   History of Present Illness  29 y.o. female past medical history of anxiety and depression, presenting for evaluation of multiple neurological complaints, which started somewhere around September 2020. Symptom onset began in september 2020 with weakness, progressing to other symptoms including urinary retention/incontinence, bowel incontinence, BPPV, paresthesias. 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  Clinical Impression  Pt presents to PT with deficits in strength, power, gait, balance, functional mobility, endurance, coordination, and motor control. Pt with ataxic movements of LUE and RLE noted during session, as well as R knee buckle during gait training. Pt is generally weak, and her current coordination deficits exacerbate her falls risk. Pt will benefit from continued acute PT POC to improve balance, strength, and gait quality, in order to reduce falls risk and restore independence. Pt will benefit from discharge home to her sister's house with outpatient PT and assistance for all OOB activity.    Follow Up Recommendations Outpatient PT;Supervision for mobility/OOB    Equipment Recommendations  None recommended by PT(pt owns necessary DME)    Recommendations for Other Services       Precautions / Restrictions Precautions Precautions: Fall Restrictions Weight Bearing Restrictions: No      Mobility  Bed Mobility Overal bed mobility: Independent                Transfers Overall transfer level: Needs assistance Equipment used: 4-wheeled walker Transfers: Sit to/from Stand Sit to Stand: Supervision         General transfer comment: PT provides cues for Rollator management, locking breaks and hand placement  Ambulation/Gait Ambulation/Gait assistance: Min guard Gait Distance (Feet): 100 Feet Assistive device: 4-wheeled walker Gait  Pattern/deviations: Step-to pattern;Decreased dorsiflexion - left;Decreased dorsiflexion - right Gait velocity: reduced Gait velocity interpretation: 1.31 - 2.62 ft/sec, indicative of limited community ambulator General Gait Details: pt with short step to gait, reduced DF bilaterally, one instance of R knee buckling which pt corrects for utilizing UE support of Rollator  Stairs            Wheelchair Mobility    Modified Rankin (Stroke Patients Only)       Balance Overall balance assessment: Needs assistance Sitting-balance support: No upper extremity supported;Feet supported Sitting balance-Leahy Scale: Normal     Standing balance support: Bilateral upper extremity supported Standing balance-Leahy Scale: Fair Standing balance comment: minG- close supervision with BUE support of Rollaotr                             Pertinent Vitals/Pain Pain Assessment: No/denies pain    Home Living Family/patient expects to be discharged to:: Private residence Living Arrangements: Alone Available Help at Discharge: Family;Available 24 hours/day(mom, sister, BIL) Type of Home: House Home Access: Level entry     Home Layout: Two level Home Equipment: Walker - 4 wheels Additional Comments: pt has option to go live at sisters, bedroom and bath on ground floor    Prior Function Level of Independence: Needs assistance   Gait / Transfers Assistance Needed: ambulates with Rollator downstairs and limited community distances, furniture surfing upstairs, recently scooting up/down stairs  ADL's / Homemaking Assistance Needed: pt requiring assistance with IADLs since september, mother cooks and Electronics engineer Domin[employer]        Extremity/Trunk Assessment   Upper Extremity Assessment Upper Extremity Assessment: LUE deficits/detail;RUE deficits/detail RUE  Deficits / Details: grossly 4/5 RUE Sensation: WNL RUE Coordination: WNL LUE Deficits / Details: L shoulder flexion  4-/5, otherwise 4/5 LUE Sensation: WNL LUE Coordination: decreased gross motor    Lower Extremity Assessment Lower Extremity Assessment: RLE deficits/detail;LLE deficits/detail RLE Deficits / Details: grossly 4/5 RLE Sensation: WNL RLE Coordination: decreased gross motor LLE Deficits / Details: Grossly 4/5 except for 4-/5 PF LLE Sensation: WNL LLE Coordination: WNL    Cervical / Trunk Assessment Cervical / Trunk Assessment: Normal  Communication   Communication: No difficulties  Cognition Arousal/Alertness: Awake/alert Behavior During Therapy: WFL for tasks assessed/performed Overall Cognitive Status: Within Functional Limits for tasks assessed                                        General Comments General comments (skin integrity, edema, etc.): VSS on RA    Exercises     Assessment/Plan    PT Assessment Patient needs continued PT services  PT Problem List Decreased strength;Decreased activity tolerance;Decreased balance;Decreased coordination;Decreased mobility;Decreased knowledge of use of DME;Decreased safety awareness       PT Treatment Interventions DME instruction;Gait training;Stair training;Functional mobility training;Therapeutic activities;Therapeutic exercise;Balance training;Neuromuscular re-education;Patient/family education    PT Goals (Current goals can be found in the Care Plan section)  Acute Rehab PT Goals Patient Stated Goal: To return to independent mobility PT Goal Formulation: With patient Time For Goal Achievement: 03/22/19 Potential to Achieve Goals: Fair Additional Goals Additional Goal #1: Pt will maintain dynamic standing balance within 10 inches of her base of support with unilateral UE support, modI.    Frequency Min 4X/week   Barriers to discharge        Co-evaluation               AM-PAC PT "6 Clicks" Mobility  Outcome Measure Help needed turning from your back to your side while in a flat bed without  using bedrails?: None Help needed moving from lying on your back to sitting on the side of a flat bed without using bedrails?: None Help needed moving to and from a bed to a chair (including a wheelchair)?: A Little Help needed standing up from a chair using your arms (e.g., wheelchair or bedside chair)?: A Little Help needed to walk in hospital room?: A Little Help needed climbing 3-5 steps with a railing? : A Lot 6 Click Score: 19    End of Session   Activity Tolerance: Patient tolerated treatment well Patient left: in chair;with call bell/phone within reach Nurse Communication: Mobility status PT Visit Diagnosis: Muscle weakness (generalized) (M62.81);Unsteadiness on feet (R26.81);Other abnormalities of gait and mobility (R26.89);Other symptoms and signs involving the nervous system (R29.898)    Time: 8938-1017 PT Time Calculation (min) (ACUTE ONLY): 23 min   Charges:   PT Evaluation $PT Eval Moderate Complexity: 1 Mod PT Treatments $Gait Training: 8-22 mins        Zenaida Niece, PT, DPT Acute Rehabilitation Pager: [phone]   Zenaida Niece 03/08/2019, 10:18 AM

## 2019-03-09 ENCOUNTER — Inpatient Hospital Stay (HOSPITAL_COMMUNITY): Payer: 59

## 2019-03-09 LAB — PROTEIN ELECTROPHORESIS, SERUM
A/G Ratio: 1.6 (ref 0.7–1.7)
Albumin ELP: 3.9 g/dL (ref 2.9–4.4)
Alpha-1-Globulin: 0.2 g/dL (ref 0.0–0.4)
Alpha-2-Globulin: 0.4 g/dL (ref 0.4–1.0)
Beta Globulin: 0.9 g/dL (ref 0.7–1.3)
Gamma Globulin: 1 g/dL (ref 0.4–1.8)
Globulin, Total: 2.5 g/dL (ref 2.2–3.9)
Total Protein ELP: 6.4 g/dL (ref 6.0–8.5)

## 2019-03-09 LAB — ENA+DNA/DS+ANTICH+CENTRO+JO...
Anti JO-1: <0.2 AI (ref 0.0–0.9)
Centromere Ab Screen: <0.2 AI (ref 0.0–0.9)
Chromatin Ab SerPl-aCnc: 2.8 AI — ABNORMAL HIGH (ref 0.0–0.9)
ENA SM Ab Ser-aCnc: <0.2 AI (ref 0.0–0.9)
Ribonucleic Protein: 0.3 AI (ref 0.0–0.9)
SSA (Ro) (ENA) Antibody, IgG: 0.9 AI (ref 0.0–0.9)
SSB (La) (ENA) Antibody, IgG: <0.2 AI (ref 0.0–0.9)
Scleroderma (Scl-70) (ENA) Antibody, IgG: <0.2 AI (ref 0.0–0.9)
ds DNA Ab: <1 IU/mL (ref 0–9)

## 2019-03-09 LAB — ANGIOTENSIN CONVERTING ENZYME, CSF: Angio Convert Enzyme: <1.5 U/L (ref 0.0–2.5)

## 2019-03-09 LAB — ANA W/REFLEX IF POSITIVE: Anti Nuclear Antibody (ANA): POSITIVE — AB

## 2019-03-09 IMAGING — MR MR THORACIC SPINE WO/W CM
6 of 9 series · 24 of 48 positions shown · IV contrast (Gadavist)
Comparison: None.

CLINICAL DATA: Demyelinating disease

EXAM:
MRI THORACIC WITHOUT AND WITH CONTRAST
TECHNIQUE: Multiplanar and multiecho pulse sequences of the thoracic spine were
obtained without and with intravenous contrast.
CONTRAST:  6mL GADAVIST GADOBUTROL 1 MMOL/ML IV SOLN

[Series 14: T1 · sagittal · 6.0mm · 1.23mm/px · 1 of 9 slices shown (1 of 3)]
[im 1/9]
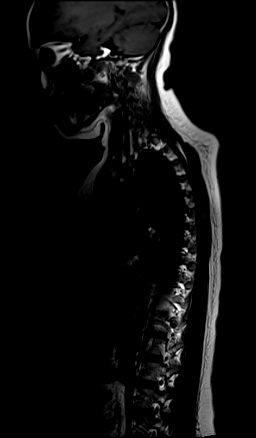

[Series 15: T2 · sagittal · 3.0mm · 0.76mm/px · 2 of 17 slices shown (1 of 2)]
[im 1/17]
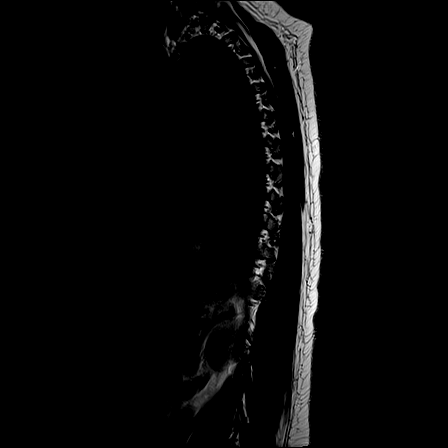
[im 17/17]
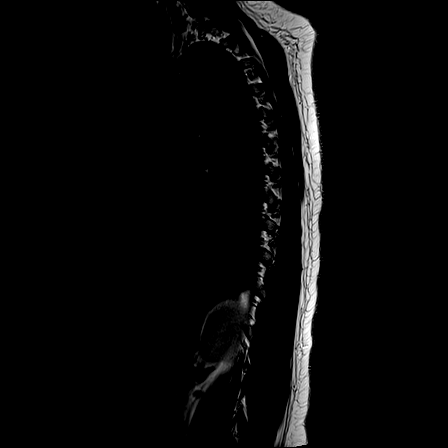

[Series 16: T1 · sagittal · 3.0mm · 0.76mm/px · 3 of 17 slices shown (2 of 3)]
[im 1/17]
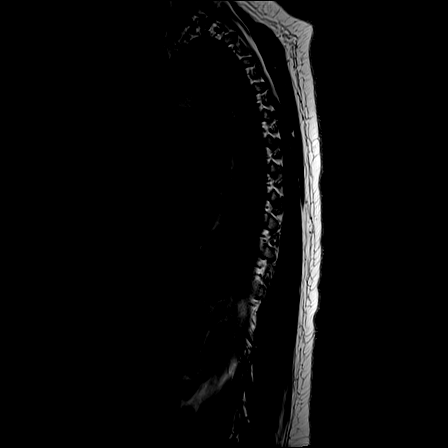
[im 9/17]
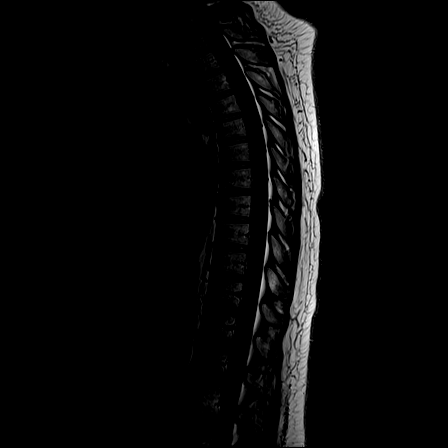
[im 17/17]
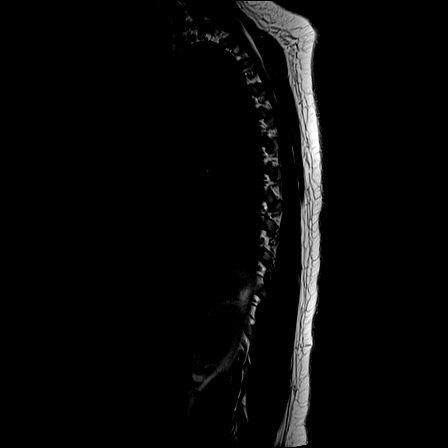

[Series 18: T2 · axial · 4.0mm · 0.59mm/px · z∈[-227,+55]mm · 9 of 48 slices shown (2 of 2)]
[im 1/48]
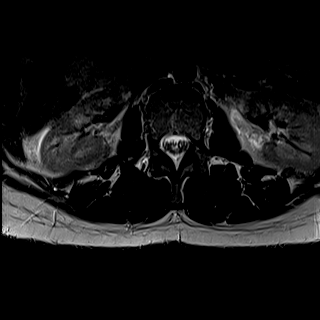
[im 6/48]
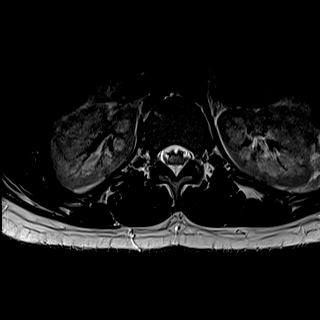
[im 12/48]
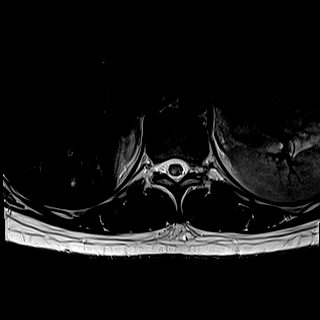
[im 18/48]
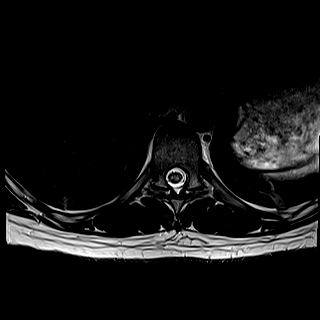
[im 24/48]
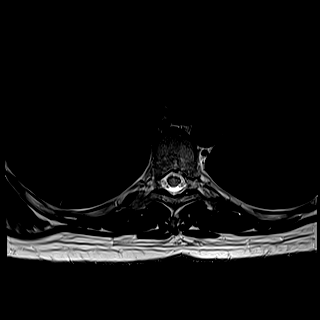
[im 30/48]
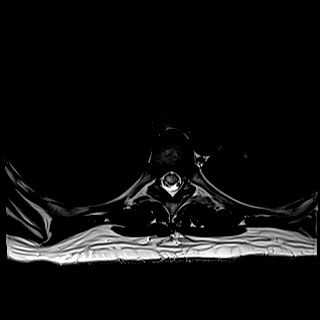
[im 36/48]
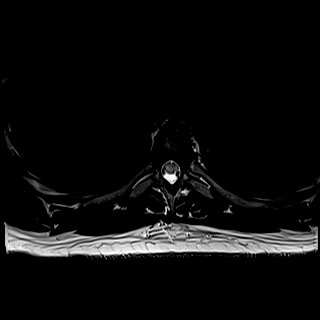
[im 42/48]
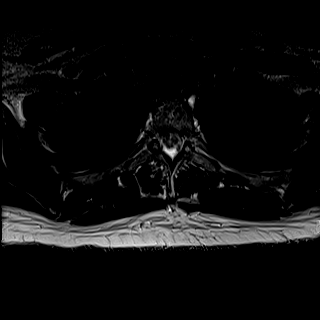
[im 48/48]
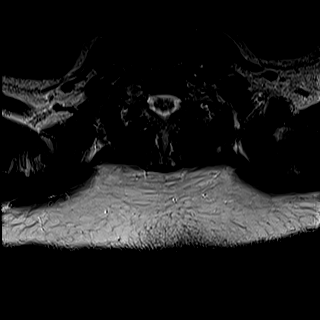

[Series 20: T1 · axial · non-contrast · 4.0mm · 0.31mm/px · z∈[-227,+55]mm · 8 of 48 slices shown (3 of 3)]
[im 1/48]
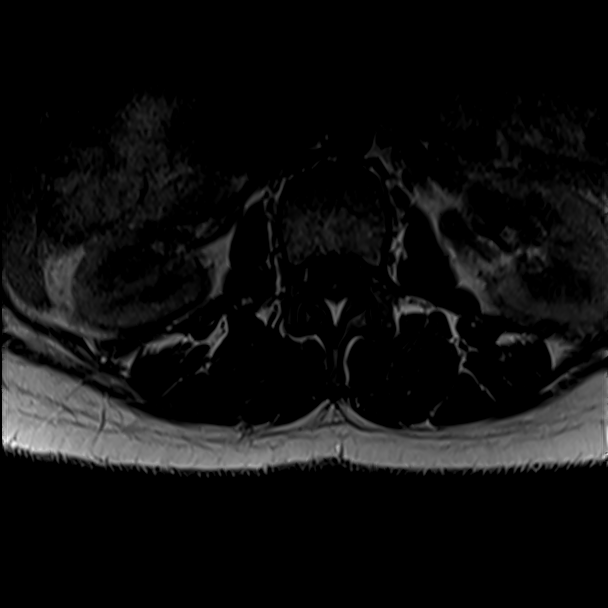
[im 6/48]
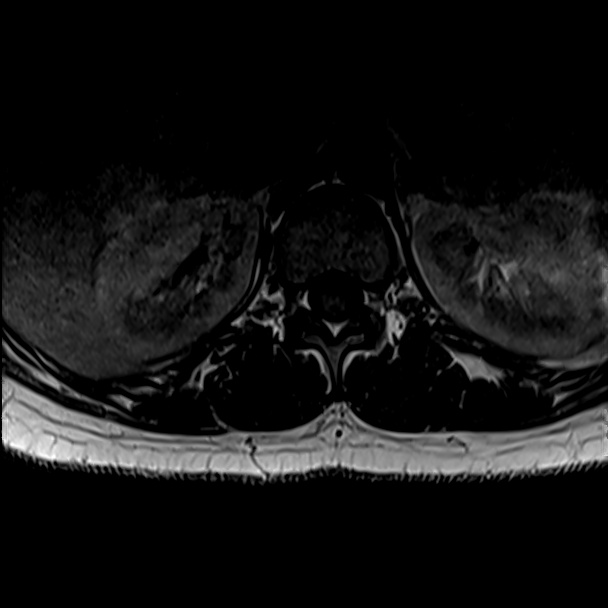
[im 12/48]
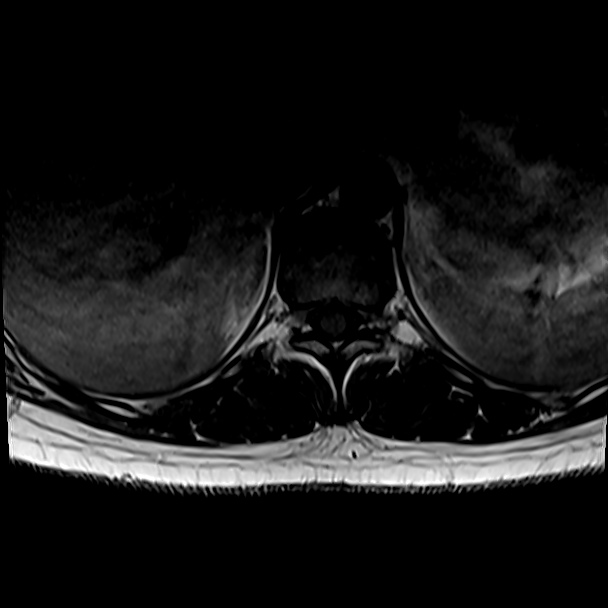
[im 18/48]
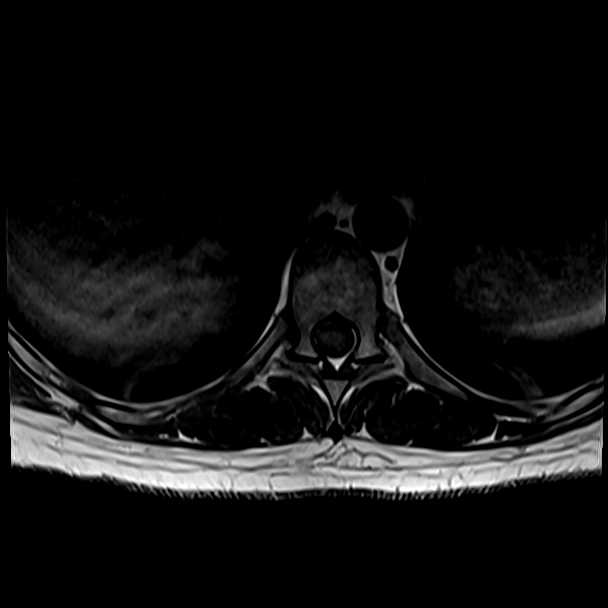
[im 30/48]
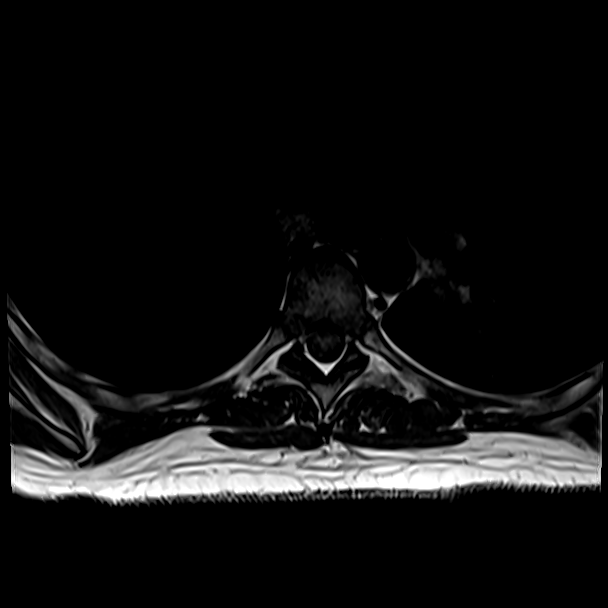
[im 36/48]
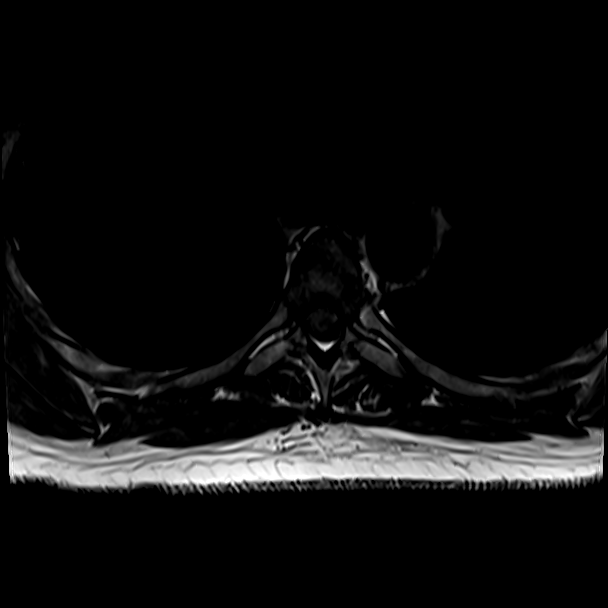
[im 42/48]
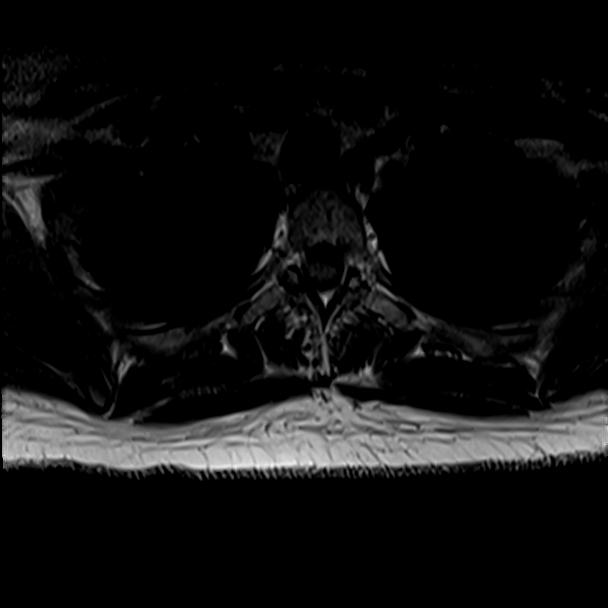
[im 48/48]
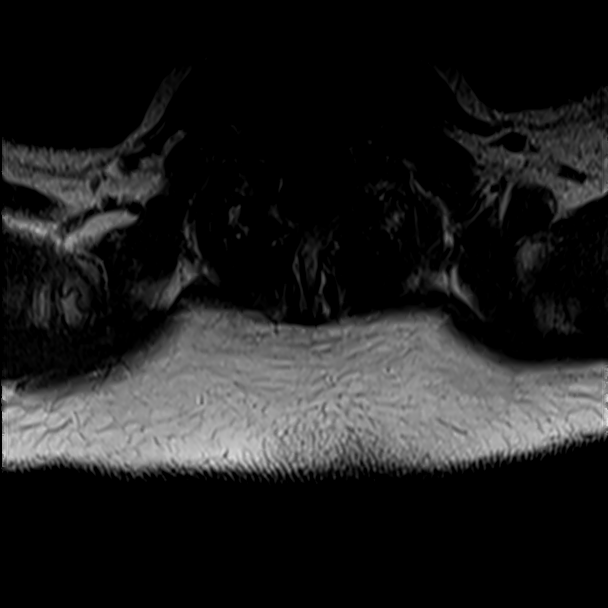

[Series 22: T1 fat-sat post-contrast · sagittal · 3.0mm · 0.76mm/px · 1 of 17 slices shown]
[im 1/17]
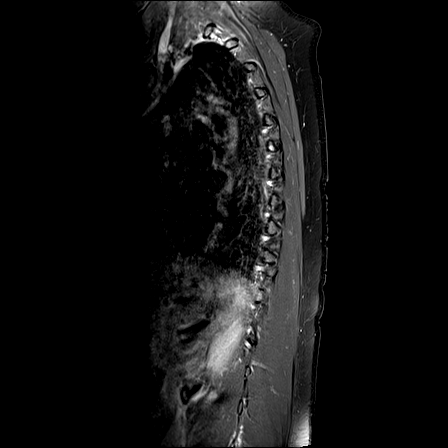

[24 of 48 positions shown; findings below may reference images not displayed]

FINDINGS: Alignment:  Physiologic.

Vertebrae: Vertebral body heights are maintained. There is no marrow
edema. No suspicious osseous lesion.

Cord: Extensive multifocal abnormal cord signal throughout the
thoracic cord. There is no abnormal enhancement.

Paraspinal and other soft tissues: Unremarkable.

Disc levels: Intervertebral disc heights and signal are maintained.
There is no disc herniation.
IMPRESSION: Extensive multifocal abnormal cord signal throughout the thoracic
cord without abnormal enhancement.

## 2019-03-09 MED ORDER — GADOBUTROL 1 MMOL/ML IV SOLN
6.0000 mL | Freq: Once | INTRAVENOUS | Status: AC | PRN
  Administered 2019-03-09: 6 mL via INTRAVENOUS

## 2019-03-09 MED ORDER — DOCUSATE SODIUM 100 MG PO CAPS
100.0000 mg | ORAL_CAPSULE | Freq: Two times a day (BID) | ORAL | Status: DC
  Administered 2019-03-09 – 2019-03-11 (×5): 100 mg via ORAL
  Filled 2019-03-09 (×6): qty 1

## 2019-03-09 NOTE — Progress Notes (Signed)
Progress Note    Abigail Lewis  YIF:[id number] DOB: 03/11/1990  DOA: 03/07/2019 PCP: [facility]    Brief Narrative:     Medical records reviewed and are as summarized below:  Abigail Lewis is an 29 y.o. female with a past medical history of anxiety and depression who presents for evaluation of a multitude of neurological complaints including some tingling and numbness that happened at the end of 2019 but seen September 2020 progressive difficulty walking, progressive weakness worse on the left arm and leg than right, and off-and-on blurred vision along with dizziness and vertiginous symptoms that have not responded to any symptomatic over-the-counter medications. 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 Other differentials include autoimmune diseases such as lupus and neurosarcoid. She was ANA and Sjogren's panel positive in November-repeat and possibly get rheumatological input as an outpatient as well.  Assessment/Plan:   Principal Problem:   Numbness and tingling Active Problems:   Tremor   Hyperlipidemia   Anxiety   Numbness, tingling (parasthesia), weakness:  MRI c/w demyelinating disorder Status post lumbar puncture: Multiple labs pending Urinalysis without signs of infection;Chest x-ray clear Will start IV steroids as recommended by Dr. Wilford Corner at 1000 mg IV daily x5 days: day 2/5 Check neurolmyelitis optica IgG Check anti-myelin Assoc IgG Check  acetylcholine ab Neurology consult appreciated PT and OT  + ANA, Sjogrens Anti SSA + Pt does note dry eyes and dry mouth on ROS -outpatient rheumatology consultation  Hyperlipidemia Cont Pravastatin 20mg  po qhs  Anxiety/ Depression Cont Zoloft 50mg  po qday Cotn Trazodone 50mg  po qhs prn   Possible periapical abscess on left 3rd molar -Clindamycin while on high-dose IV steroids  Family Communication/Anticipated D/C date and plan/Code Status   DVT prophylaxis: Lovenox Code Status: Full Code.  Family Communication:  Disposition Plan: Today is day 2 of 5 days of IV steroids per neurology   Medical Consultants:    Neurology.     Subjective:   Had difficulty sleeping last night  Objective:    Vitals:   03/08/19 1100 03/08/19 2009 03/09/19 1025 03/09/19 1148  BP: 102/69 116/78 105/69 105/69  Pulse: 92 85 90 90  Resp:   16 16  Temp: 99.1 F (37.3 C) 97.9 F (36.6 C) 98.2 F (36.8 C)   TempSrc: Oral Oral  Oral  SpO2: 99% 100% 100%   Weight:    65.7 kg  Height:    5\' 3"  (1.6 m)    Intake/Output Summary (Last 24 hours) at 03/09/2019 1201 Last data filed at 03/09/2019 1012 Gross per 24 hour  Intake 240 ml  Output 300 ml  Net -60 ml   Filed Weights   03/08/19 0153 03/09/19 1148  Weight: 65.7 kg 65.7 kg    Exam: In bed, no acute distress, working with neurology PA Regular rate and rhythm No increased work of breathing Alert and oriented x3 Neuro: Following commands, wide based shuffling steps upon ambulation, left hand with significant tremor, moves all 4 extremities   Data Reviewed:   I have personally reviewed  following labs and imaging studies:  Labs: Labs show the following:   Basic Metabolic Panel: Recent Labs  Lab 03/07/19 1650 03/08/19 0513  NA 137 137  K 3.8 3.7  CL 103 106  CO2 24 25  GLUCOSE 86 78  BUN 11 7  CREATININE 0.72 0.66  CALCIUM 9.4 9.3   GFR Estimated Creatinine Clearance: 92.8 mL/min (by C-G formula based on SCr  of 0.66 mg/dL). Liver Function Tests: Recent Labs  Lab 03/07/19 1650 03/08/19 0513  AST 11* 14*  ALT 12 12  ALKPHOS 38 40  BILITOT 0.6 0.7  PROT 7.0 6.5  ALBUMIN 4.1 3.6   No results for input(s): LIPASE, AMYLASE in the last 168 hours. No results for input(s): AMMONIA in the last 168 hours. Coagulation profile No results for input(s): INR, PROTIME in the last 168 hours.  CBC: Recent Labs  Lab 03/07/19 1650 03/08/19 0513  WBC 5.4 3.9*  NEUTROABS 3.6  --   HGB 11.9* 11.7*  HCT 35.6* 34.6*  MCV 94.7 93.5  PLT 216 206   Cardiac Enzymes: Recent Labs  Lab 03/08/19 0513  CKTOTAL 35*  CKMB 0.2*   BNP (last 3 results) No results for input(s): PROBNP in the last 8760 hours. CBG: No results for input(s): GLUCAP in the last 168 hours. D-Dimer: No results for input(s): DDIMER in the last 72 hours. Hgb A1c: No results for input(s): HGBA1C in the last 72 hours. Lipid Profile: No results for input(s): CHOL, HDL, LDLCALC, TRIG, CHOLHDL, LDLDIRECT in the last 72 hours. Thyroid function studies: Recent Labs    03/08/19 0513  TSH 2.212   Anemia work up: Recent Labs    03/08/19 0513  VITAMINB12 894   Sepsis Labs: Recent Labs  Lab 03/07/19 1650 03/08/19 0513  WBC 5.4 3.9*    Microbiology Recent Results (from the past 240 hour(s))  SARS CORONAVIRUS 2 (TAT 6-24 HRS) Nasopharyngeal Nasopharyngeal Swab     Status: None   Collection Time: 03/07/19  8:50 PM   Specimen: Nasopharyngeal Swab  Result Value Ref Range Status   SARS Coronavirus 2 NEGATIVE NEGATIVE Final    Comment: (NOTE) SARS-CoV-2 target nucleic acids are NOT DETECTED. The SARS-CoV-2 RNA is generally detectable in upper and lower respiratory specimens during the acute phase of infection. Negative results do not preclude SARS-CoV-2 infection, do not rule out co-infections with other pathogens, and should not be used as the sole basis for treatment or other patient management decisions. Negative results  must be combined with clinical observations, patient history, and epidemiological information. The expected result is Negative. Fact Sheet for Patients: HairSlick.no Fact Sheet for Healthcare Providers: quierodirigir.com This test is not yet approved or cleared by the [place] FDA and  has been authorized for detection and/or diagnosis of SARS-CoV-2 by FDA under an Emergency Use Authorization (EUA). This EUA will remain  in effect (meaning this test can be used) for the duration of the COVID-19 declaration under Section 56 4(b)(1) of the Act, 21 U.S.C. section 360bbb-3(b)(1), unless the authorization is terminated or revoked sooner. Performed at [facility], [address] [place], [place] [address]   CSF culture     Status: None (Preliminary result)   Collection Time: 03/07/19 11:53 PM   Specimen: CSF; Cerebrospinal Fluid  Result Value Ref Range Status   Specimen Description CSF  Final   Special Requests TUBE 2  Final   Gram Stain   Final    CYTOSPIN SMEAR WBC PRESENT, PREDOMINANTLY MONONUCLEAR NO  ORGANISMS SEEN    Culture   Final    NO GROWTH 2 DAYS Performed at [facility], [address] [place], [place] [address]    Report Status PENDING  Incomplete    Procedures and diagnostic studies:  DG Orthopantogram  Result Date: 03/08/2019 CLINICAL DATA:  Tooth pain EXAM: ORTHOPANTOGRAM/PANORAMIC COMPARISON:  None. FINDINGS: Dental caries are noted most marked within the third molar within the maxilla on the left. Other scattered caries are noted. Mild periapical lucency is noted in the left first mandibular molar. Caries are noted within this tooth as well. This could represent an early periapical abscess. No other focal abnormality is noted. IMPRESSION: Diffuse dental caries. Periapical lucency involving the left third mandibular molar as described. Electronically Signed   By: Alcide Clever M.D.   On:  03/08/2019 11:58   DG Chest 2 View  Result Date: 03/07/2019 CLINICAL DATA:  Two months of leg weakness, concern for infection EXAM: CHEST - 2 VIEW COMPARISON:  Radiograph 04/11/2017 FINDINGS: No consolidation, features of edema, pneumothorax, or effusion. Pulmonary vascularity is normally distributed. The cardiomediastinal contours are unremarkable. No acute osseous or soft tissue abnormality. IMPRESSION: No acute cardiopulmonary abnormality. Electronically Signed   By: Kreg Shropshire M.D.   On: 03/07/2019 20:24   MR Brain W and Wo Contrast  Result Date: 03/07/2019 CLINICAL DATA:  Parkinson's disease. Per the ED physician, the patient has no history of Parkinson's disease the patient presents for 1 month of progressive weakness and neurological complaints. EXAM: MRI HEAD WITHOUT AND WITH CONTRAST TECHNIQUE: Multiplanar, multiecho pulse sequences of the brain and surrounding structures were obtained without and with intravenous contrast. CONTRAST:  22mL GADAVIST GADOBUTROL 1 MMOL/ML IV SOLN COMPARISON:  No pertinent prior studies available for comparison. FINDINGS: Brain: Sagittal T1 weighted imaging is motion degraded. There is extensive multifocal T2/FLAIR hyperintense signal abnormality within the subcortical/juxtacortical and deep periventricular white matter as well as within the bilateral internal capsules, brainstem (including the periaqueductal gray), surrounding the fourth ventricle, within the cerebellar white matter and right brachium pontis. Numerous sites within the bilateral cerebral hemispheres demonstrate corresponding enhancement as well as sites within the right cerebellum and surrounding the fourth ventricle (see annotations on the axial and coronal postcontrast T1 weighted imaging). There is no evidence of acute infarct. No midline shift or extra-axial fluid collection. No chronic intracranial blood products. Vascular: Flow voids maintained within the proximal large arterial vessels. Skull  and upper cervical spine: No focal marrow lesion. Sinuses/Orbits: Visualized orbits demonstrate no acute abnormality. Mild ethmoid sinus and right maxillary sinus mucosal thickening. No significant mastoid effusion. Probable small focus of abnormal enhancement within the intraorbital left optic nerve (series 26, image 18). These results were called by telephone at the time of interpretation on 03/07/2019 at 8:00 pm to provider Dr. Anitra Lauth, who verbally acknowledged these results. IMPRESSION: Extensive multifocal white matter signal abnormality involving the subcortical/juxtacortical and deep periventricular white matter within both cerebral hemispheres, the brainstem (including the periaqueductal gray), the margin of the fourth ventricle, as well as the cerebellar white matter and right brachium pontis. There are numerous sites of corresponding enhancement within the bilateral cerebral hemispheres, as well as within the right cerebellum and surrounding the fourth ventricle. Additionally, there is probable focal abnormal enhancement of the intraorbital left optic nerve. Findings are most consistent with a demyelinating process with active demyelination. 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 Electronically Signed   By: Kellie Simmering DO   On: 03/07/2019 20:05   MR Cervical Spine W or Wo Contrast  Result Date: 03/07/2019 CLINICAL DATA:  Neck pain, chronic, no prior imaging. EXAM: MRI CERVICAL SPINE WITHOUT AND WITH CONTRAST TECHNIQUE: Multiplanar and multiecho pulse sequences of the cervical spine, to include the craniocervical junction and cervicothoracic junction, were obtained without and with intravenous contrast. CONTRAST:  32mL GADAVIST GADOBUTROL 1 MMOL/ML IV SOLN COMPARISON:  Concurrently performed brain MRI FINDINGS: Alignment: Straightening of the expected cervical  lordosis. No significant spondylolisthesis. Vertebrae: Vertebral body height is maintained. No marrow edema or suspicious osseous lesion. Cord: There is extensive multifocal T2/STIR hyperintense signal abnormality within the cervical and visualized upper thoracic spinal cord. For instance, there is a large longitudinally extensive focus of cord signal abnormality spanning the C2-C4 levels (series 15, image 8). Longitudinally extensive involvement is also seen throughout much of the lower cervical and visualized upper thoracic cord (series 15, image 9). Postcontrast imaging is moderately motion degraded. Within this limitation, no definite abnormal cord enhancement is identified. Posterior Fossa, vertebral arteries, paraspinal tissues: Posterior fossa better assessed on concurrently performed brain MRI. The paraspinal soft tissues are within normal limits. Flow voids preserved within the cervical vertebral arteries. Disc levels: Intervertebral disc height is preserved throughout the cervical spine. No significant disc herniation, spinal canal stenosis or neural foraminal narrowing. These results were called by telephone at the time of interpretation on 03/07/2019 at 8:10 pm to provider Dr. Maryan Rued, who verbally acknowledged these results. IMPRESSION: Longitudinally extensive multifocal spinal cord signal abnormality involving the majority of the cervical and visualized upper thoracic spinal cord. Findings are most consistent with a demyelinating process. 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 Motion degraded postcontrast imaging. No definite abnormal cord enhancement is identified. Electronically Signed   By: Kellie Simmering DO   On: 03/07/2019 20:11   MR THORACIC SPINE W WO CONTRAST  Result Date: 03/09/2019 CLINICAL DATA:  Demyelinating disease EXAM: MRI THORACIC WITHOUT AND WITH  CONTRAST TECHNIQUE: Multiplanar and multiecho pulse sequences of the thoracic spine were obtained without and with intravenous contrast. CONTRAST:  100mL GADAVIST GADOBUTROL 1 MMOL/ML IV SOLN COMPARISON:  None. FINDINGS: Alignment:  Physiologic. Vertebrae: Vertebral body heights are maintained. There is no marrow edema. No suspicious osseous lesion. Cord: Extensive multifocal abnormal cord signal throughout the thoracic cord. There is no abnormal enhancement. Paraspinal and other soft tissues: Unremarkable. Disc levels: Intervertebral disc heights and signal are maintained. There is no disc herniation. IMPRESSION: Extensive multifocal abnormal cord signal throughout the thoracic cord without abnormal enhancement. Electronically Signed   By: Macy Mis M.D.   On: 03/09/2019 08:33    Medications:   . acidophilus  1 capsule Oral Daily  . clindamycin  300 mg Oral Q8H  . enoxaparin (LOVENOX) injection  40 mg Subcutaneous Q24H  . pantoprazole  40 mg Oral Daily  . pravastatin  20 mg Oral Daily  . sertraline  50 mg Oral Daily  . sodium chloride flush  3 mL Intravenous Q12H   Continuous Infusions: . sodium chloride    . methylPREDNISolone (SOLU-MEDROL) injection 1,000 mg (03/09/19 1002)     LOS: 1 day   Geradine Girt  [facility]   How to contact the [facility] Attending or Consulting provider [place] or covering provider during after hours [place], for this patient?  1. Check  the care team in [facility] and look for a) attending/consulting TRH provider listed and b) the [facility] team listed 2. Log into [email or web address] and use [place]'s universal password to access. If you do not have the password, please contact the hospital operator. 3. Locate the [facility] provider you are looking for under [facility] and page to a number that you can be directly reached. 4. If you still have difficulty reaching the provider, please page the [facility] (Director on Call) for the Hospitalists listed on amion for  assistance.  03/09/2019, 12:01 PM

## 2019-03-09 NOTE — Evaluation (Signed)
Occupational Therapy Evaluation Patient Details Name: RANEISHA BRESS MRN: [id number] DOB: December 31, 1990 Today's Date: 03/09/2019    History of Present Illness 29 y.o. female past medical history of anxiety and depression, presenting for evaluation of multiple neurological complaints, which started somewhere around September 2020. Symptom onset began in september 2020 with weakness, progressing to other symptoms including urinary retention/incontinence, bowel incontinence, BPPV, paresthesias. 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 Started IV steroids 2/14 (to have 5 days per MD note)   Clinical Impression   Pt has been using a rollator and depending on her mom for IADL in the weeks leading up to admission, but independent in ADL. Pt works from home for the [employer] and has a second job as an NT on [employer]. Pt presents with L UE ataxia, decreased activity tolerance and poor standing balance. She requires set up to min guard assist for ADL. She has figured out how to compensate for L UE ataxia by stabilizing on a surface. Will follow acutely.    Follow Up Recommendations  Home health OT    Equipment Recommendations  3 in 1 bedside commode    Recommendations for Other Services       Precautions / Restrictions Precautions Precautions: Fall Restrictions Weight Bearing Restrictions: No      Mobility Bed Mobility              General bed mobility comments: pt in chair  Transfers Overall transfer level: Needs assistance Equipment used: 4-wheeled walker Transfers: Sit to/from Stand Sit to Stand: Supervision Stand pivot transfers: Min guard       General transfer comment: cues for safe use of rollator    Balance Overall balance assessment: Needs assistance Sitting-balance support: No upper extremity supported;Feet supported Sitting balance-Leahy Scale: Normal     Standing balance support: Single extremity supported;During functional activity Standing balance-Leahy Scale:  Poor Standing balance comment: requires UE support for dynamic balance                           ADL either performed or assessed with clinical judgement   ADL Overall ADL's : Needs assistance/impaired Eating/Feeding: Sitting;Minimal assistance Eating/Feeding Details (indicate cue type and reason): assist to open packages Grooming: Modified independent;Sitting Grooming Details (indicate cue type and reason): stabilizes on sink to compensate for ataxia  Upper Body Bathing: Sitting;Set up   Lower Body Bathing: Supervison/ safety;Sit to/from stand   Upper Body Dressing : Set up;Sitting   Lower Body Dressing: Supervision/safety;Sit to/from stand   Toilet Transfer: Supervision/safety;Ambulation;RW   Toileting- Clothing Manipulation and Hygiene: Supervision/safety;Sit to/from stand       Functional mobility during ADLs: Supervision/safety;Rolling walker General ADL Comments: Educated in multiple uses of 3 in 1.     Vision Baseline Vision/History: Wears glasses Wears Glasses: At all times Patient Visual Report: No change from baseline Additional Comments: pt was having floaters in R eye, has resolved     Perception     Praxis      Pertinent Vitals/Pain Pain Assessment: No/denies pain     Hand Dominance Right   Extremity/Trunk Assessment Upper Extremity Assessment Upper Extremity Assessment: LUE deficits/detail LUE Deficits / Details: ataxia LUE Coordination: decreased gross motor;decreased fine motor   Lower Extremity Assessment Lower Extremity Assessment: Defer to PT evaluation   Cervical / Trunk Assessment Cervical / Trunk Assessment: Normal   Communication Communication Communication: No difficulties   Cognition Arousal/Alertness: Awake/alert Behavior During Therapy: WFL for tasks assessed/performed Overall Cognitive  Status: Within Functional Limits for tasks assessed                                     General Comments       Exercises    Shoulder Instructions      Home Living Family/patient expects to be discharged to:: Private residence Living Arrangements: Alone Available Help at Discharge: Family;Available 24 hours/day(mom is staying with her) Type of Home: House Home Access: Stairs to enter [employer] of Steps: 1 Entrance Stairs-Rails: None Home Layout: Two level     Bathroom Shower/Tub: Producer, television/film/video: Standard     Home Equipment: Environmental consultant - 4 wheels   Additional Comments: pt has option to go live at sisters, bedroom and bath on ground floor      Prior Functioning/Environment Level of Independence: Needs assistance  Gait / Transfers Assistance Needed: has been using rollator since her gait has been off ADL's / Homemaking Assistance Needed: pt requiring assistance with IADLs since september, mother cooks and cleans            OT Problem List: Impaired balance (sitting and/or standing);Decreased knowledge of use of DME or AE;Impaired UE functional use      OT Treatment/Interventions: Self-care/ADL training;DME and/or AE instruction;Patient/family education;Balance training;Therapeutic activities    OT Goals(Current goals can be found in the care plan section) Acute Rehab OT Goals Patient Stated Goal: To return to independence OT Goal Formulation: With patient Potential to Achieve Goals: Good ADL Goals Pt Will Perform Grooming: with modified independence;standing Pt Will Perform Lower Body Bathing: with modified independence;sit to/from stand Pt Will Perform Lower Body Dressing: with modified independence;sit to/from stand Pt Will Transfer to Toilet: with modified independence;ambulating;bedside commode(over toilet) Pt Will Perform Toileting - Clothing Manipulation and hygiene: with modified independence;sit to/from stand Pt Will Perform Tub/Shower Transfer: Shower transfer;with modified independence;3 in 1;rolling walker;ambulating Additional ADL Goal  #1: Pt will state at least 3 energy conservation strategies as instructed. Additional ADL Goal #2: Pt will gather items necessary for ADL around her room with rollator modified independently.  OT Frequency: Min 2X/week   Barriers to D/C:            Co-evaluation              AM-PAC OT "6 Clicks" Daily Activity     Outcome Measure Help from another person eating meals?: A Little Help from another person taking care of personal grooming?: None Help from another person toileting, which includes using toliet, bedpan, or urinal?: A Little Help from another person bathing (including washing, rinsing, drying)?: A Little Help from another person to put on and taking off regular upper body clothing?: None Help from another person to put on and taking off regular lower body clothing?: A Little 6 Click Score: 20   End of Session Equipment Utilized During Treatment: Rolling walker;Gait belt  Activity Tolerance: Patient tolerated treatment well Patient left: in chair;with call bell/phone within reach  OT Visit Diagnosis: Unsteadiness on feet (R26.81);Other abnormalities of gait and mobility (R26.89)                Time: 2993-7169 OT Time Calculation (min): 31 min Charges:  OT General Charges $OT Visit: 1 Visit OT Evaluation $OT Eval Moderate Complexity: 1 Mod OT Treatments $Self Care/Home Management : 8-22 mins  {Kashden Deboy, [place] Bailiff 03/09/2019, 1:26 PM  Martie Round, OTR/L Acute Rehabilitation Services  Pager: [phone] Office: [phone]

## 2019-03-09 NOTE — Progress Notes (Signed)
NEUROLOGY PROGRESS NOTE   Subjective: Patient states that she did not sleep very well last night.  I explained that it is most likely secondary to the steroids.  She also states that prior to admission she was having floaters of her right eye however she has not had any since admission.  Still feeling unsteady with her gait.  Denies any blurred vision.  Denies any weakness.  Exam: Vitals:   03/08/19 1100 03/08/19 2009  BP: 102/69 116/78  Pulse: 92 85  Resp:    Temp: 99.1 F (37.3 C) 97.9 F (36.6 C)  SpO2: 99% 100%    ROS General ROS: negative for - chills, fatigue, fever, night sweats, weight gain or weight loss Psychological ROS: negative for - behavioral disorder, hallucinations, memory difficulties, mood swings or suicidal ideation Ophthalmic ROS: negative for - blurry vision, double vision, eye pain or loss of vision ENT ROS: negative for - epistaxis, nasal discharge, oral lesions, sore throat, tinnitus or vertigo Respiratory ROS: negative for - cough, hemoptysis, shortness of breath or wheezing Cardiovascular ROS: negative for - chest pain, dyspnea on exertion, edema or irregular heartbeat Gastrointestinal ROS: negative for - abdominal pain, diarrhea, hematemesis, nausea/vomiting or stool incontinence Genito-Urinary ROS: negative for - dysuria, hematuria, incontinence or urinary frequency/urgency Musculoskeletal ROS: negative for - joint swelling or muscular weakness Neurological ROS: as noted in HPI Dermatological ROS: negative for rash and skin lesion changes    Physical Exam  Constitutional: Appears well-developed and well-nourished.  Eyes: No scleral injection HENT: No OP obstrucion Head: Normocephalic.  Cardiovascular: Palpable.  Respiratory: Effort normal, non-labored breathing GI: Soft.  No distension. There is no tenderness.  Skin: WDI   Neuro:  Mental Status: Alert, oriented, thought content appropriate.  Speech fluent without evidence of aphasia.  Able to  follow 3 step commands without difficulty. Cranial Nerves: II:  Visual fields grossly normal,  III,IV, VI: ptosis not present, extra-ocular motions intact bilaterally pupils equal, round, reactive to light and accommodation-no APD noted V,VII: smile symmetric, facial light touch sensation normal bilaterally VIII: hearing normal bilaterally IX,X: Palate rises midline XI: bilateral shoulder shrug XII: midline tongue extension Motor: Right : Upper extremity   5/5    Left:     Upper extremity   5/5  Lower extremity   5/5     Lower extremity   5/5 Tone and bulk:normal tone throughout; no atrophy noted Sensory: Pinprick and light touch intact throughout, bilaterally Plantars: Right: downgoing   Left: downgoing Cerebellar: Left finger-to-nose shows significant tremor,  and normal heel-to-shin test shows mild dysmetria with left heel over right shin Gait: Wide-based, shuffling steps    Medications:  Scheduled: . acidophilus  1 capsule Oral Daily  . clindamycin  300 mg Oral Q8H  . enoxaparin (LOVENOX) injection  40 mg Subcutaneous Q24H  . pantoprazole  40 mg Oral Daily  . pravastatin  20 mg Oral Daily  . sertraline  50 mg Oral Daily  . sodium chloride flush  3 mL Intravenous Q12H   Continuous: . sodium chloride    . methylPREDNISolone (SOLU-MEDROL) injection 1,000 mg (03/08/19 0940)    Pertinent Labs/Diagnostics:  Results for Abigail Lewis, Abigail Lewis (MRN [id number]) as of 03/09/2019 08:49  Ref. Range 03/07/2019 23:18 03/08/2019 05:13  Appearance, CSF Latest Ref Range: CLEAR  CLEAR   Glucose, CSF Latest Ref Range: 40 - 70 mg/dL  49  RBC Count, CSF Latest Ref Range: 0 /cu mm 1 (H)   WBC, CSF Latest Ref Range: 0 - 5 /  cu mm 6 (H)   Other Cells, CSF Unknown TOO FEW TO COUNT, SMEAR AVAILABLE FOR REVIEW   Color, CSF Latest Ref Range: COLORLESS  COLORLESS   Supernatant Unknown NOT INDICATED   Total  Protein, CSF Latest Ref Range: 15 - 45 mg/dL  51 (H)  Tube # Unknown 3    Pending labs: IgG,  oligoclonal bands, and MO antibody, myelin associated glycoprotein protein IgG, UPEP/U IFE/light chains/TP, 24-hour urine, ACE   MR Brain W and Wo Contrast  Result Date: 03/07/2019 CLINICAL DATA:  Parkinson's disease. Per the ED physician, the patient has no history of Parkinson's disease the patient IMPRESSION: Extensive multifocal white matter signal abnormality involving the subcortical/juxtacortical and deep periventricular white matter within both cerebral hemispheres, the brainstem (including the periaqueductal gray), the margin of the fourth ventricle, as well as the cerebellar white matter and right brachium pontis. There are numerous sites of corresponding enhancement within the bilateral cerebral hemispheres, as well as within the right cerebellum and surrounding the fourth ventricle. Additionally, there is probable focal abnormal enhancement of the intraorbital left optic nerve. Findings are most consistent with a demyelinating process with active demyelination. 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 Electronically Signed   By: Jackey Loge DO   On: 03/07/2019 20:05   MR Cervical Spine W or Wo Contrast  Result Date: 03/07/2019 . IMPRESSION: Longitudinally extensive multifocal spinal cord signal abnormality involving the majority of the cervical and visualized upper thoracic spinal cord. Findings are most consistent with a demyelinating process. 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 Motion degraded postcontrast imaging. No definite abnormal cord enhancement is identified. Electronically Signed   By: Jackey Loge DO   On: 03/07/2019 20:11     Felicie Morn PA-C [facility] [phone]  Assessment:  29 year old female, with 6 months progressive dizziness, worsening gait, tremor of the left arm since 1 month and currently still present.  Still states no decrease or loss of vision.  At this time concern is for MS versus NMO. I think MS is more likely at this point given the degree of brain involvement.   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 Sherol Dade of [facility] 1-2 weeks on discharge    Ritta Slot, MD [facility] [phone]  If 7pm- 7am, please page neurology on call as listed in AMION.  03/09/2019, 8:41 AM

## 2019-03-09 NOTE — Progress Notes (Signed)
Physical Therapy Treatment Patient Details Name: Abigail Lewis MRN: [id number] DOB: 08/24/1990 Today's Date: 03/09/2019    History of Present Illness 29 y.o. female past medical history of anxiety and depression, presenting for evaluation of multiple neurological complaints, which started somewhere around September 2020. Symptom onset began in september 2020 with weakness, progressing to other symptoms including urinary retention/incontinence, bowel incontinence, BPPV, paresthesias. 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 Started IV steroids 2/14 (to have 5 days per MD note)    PT Comments    Patient highly motivated and feels she is already stronger from 1st (of 5) dose of steroids. Initiated education re: HEP and as pt resting discussed follow-up PT recommendations in more detail. Ultimately feel pt will benefit more from [place] (due to fatigue issues of getting to OP and then doing her PT; also due to pt is going home with sister and one of her primary goals is to return to her home alone). Will continue to assess this as pt responds to IV steroids.     Follow Up Recommendations  Supervision for mobility/OOB;Home health PT(anticipate too fatigued to get to OP and then do PT)     Equipment Recommendations  None recommended by PT    Recommendations for Other Services       Precautions / Restrictions Precautions Precautions: Fall Restrictions Weight Bearing Restrictions: No    Mobility  Bed Mobility Overal bed mobility: Independent             General bed mobility comments: sitting EOB on arrival  Transfers Overall transfer level: Needs assistance Equipment used: 4-wheeled walker Transfers: Sit to/from [facility] Sit to Stand: Supervision Stand pivot transfers: Min guard       General transfer comment: PT provides cues for Rollator management, locking breaks and hand placement (especially with pivot to sit on and get off of  rollator)  Ambulation/Gait Ambulation/Gait assistance: Min guard Gait Distance (Feet): 5 Feet(x 2) Assistive device: 4-wheeled walker Gait Pattern/deviations: Step-to pattern;Decreased dorsiflexion - left;Decreased dorsiflexion - right Gait velocity: reduced   General Gait Details: in room only with focus on exercises for HEP   Stairs             Wheelchair Mobility    Modified Rankin (Stroke Patients Only)       Balance Overall balance assessment: Needs assistance Sitting-balance support: No upper extremity supported;Feet supported Sitting balance-Leahy Scale: Normal     Standing balance support: Single extremity supported;During functional activity Standing balance-Leahy Scale: Poor Standing balance comment: requires UE support for dynamic reaching                            Cognition Arousal/Alertness: Awake/alert Behavior During Therapy: WFL for tasks assessed/performed Overall Cognitive Status: Within Functional Limits for tasks assessed                                        Exercises General Exercises - Lower Extremity Mini-Sqauts: Strengthening;Both;10 reps;Standing(bil UE support at counter)    General Comments General comments (skin integrity, edema, etc.): INcreased time educating pt on pro's/con's of HHPT vs OPPT and concern for fatigue limiting OP participation (ie. once she completed ADLs, then traveled to center and into center, would she be able to tolerate OP?). Also one of her immediate goals is to return to living alone in her place which has  two stories. HH can help her process this decision.       Pertinent Vitals/Pain Pain Assessment: No/denies pain    Home Living Family/patient expects to be discharged to:: Private residence Living Arrangements: Alone Available Help at Discharge: Family;Available 24 hours/day(going to stay with sister and BIL (mom also available)) Type of Home: House(sister's set up) Home  Access: Stairs to enter(at sister's) Entrance Stairs-Rails: None Home Layout: Two level(her home; sister's is one level)        Prior Function            PT Goals (current goals can now be found in the care plan section) Acute Rehab PT Goals Patient Stated Goal: To return to independent mobility PT Goal Formulation: With patient Time For Goal Achievement: 03/22/19 Potential to Achieve Goals: Fair Progress towards PT goals: Progressing toward goals    Frequency    Min 4X/week      PT Plan Discharge plan needs to be updated    Co-evaluation              AM-PAC PT "6 Clicks" Mobility   Outcome Measure  Help needed turning from your back to your side while in a flat bed without using bedrails?: None Help needed moving from lying on your back to sitting on the side of a flat bed without using bedrails?: None Help needed moving to and from a bed to a chair (including a wheelchair)?: A Little Help needed standing up from a chair using your arms (e.g., wheelchair or bedside chair)?: A Little Help needed to walk in hospital room?: A Little Help needed climbing 3-5 steps with a railing? : A Lot 6 Click Score: 19    End of Session   Activity Tolerance: Patient tolerated treatment well Patient left: in chair;with call bell/phone within reach;with nursing/sitter in room Nurse Communication: Mobility status PT Visit Diagnosis: Muscle weakness (generalized) (M62.81);Unsteadiness on feet (R26.81);Other abnormalities of gait and mobility (R26.89);Other symptoms and signs involving the nervous system (R29.898)     Time: 1610-9604 PT Time Calculation (min) (ACUTE ONLY): 28 min  Charges:  $Therapeutic Exercise: 8-22 mins $Self Care/Home Management: 8-22                      Jerolyn Center, PT Pager [phone]    Zena Amos 03/09/2019, 10:19 AM

## 2019-03-09 NOTE — TOC Initial Note (Signed)
Transition of Care [facility]) - Initial/Assessment Note    Patient Details  Name: Abigail Lewis MRN: [id number] Date of Birth: 05/04/1990  [facility]) CM/SW Contact:    Kermit Balo, RN Phone Number: 03/09/2019, 2:55 PM  Clinical Narrative:                 Pt lives home alone but planning to d/c to sisters home when medically ready: [address] in [place]. Pt with recommendations for [facility] services. Choice provided and [facility] selected. Lupita Leash with [facility] accepted the referral. TOC following for further d/c needs.   Expected Discharge Plan: Home w Home Health Services Barriers to Discharge: Continued Medical Work up   Patient Goals and CMS Choice   CMS Medicare.gov Compare Post Acute Care list provided to:: Patient Choice offered to / list presented to : Patient  Expected Discharge Plan and Services Expected Discharge Plan: Home w Home Health Services   Discharge Planning Services: CM Consult Post Acute Care Choice: Home Health Living arrangements for the past 2 months: Apartment                           HH Arranged: PT HH Agency: [facility] Date HH Agency Contacted: 03/09/19   Representative spoke with at [facility] Agency: Lupita Leash  Prior Living Arrangements/Services Living arrangements for the past 2 months: Apartment Lives with:: Self Patient language and need for interpreter reviewed:: Yes Do you feel safe going back to the place where you live?: Yes      Need for Family Participation in Patient Care: Yes (Comment) Care giver support system in place?: Yes (comment)(Pt is going to d/c to sisters home when medically ready) Current home services: DME(rollator) Criminal Activity/Legal Involvement Pertinent to Current Situation/Hospitalization: No - Comment as needed  Activities of Daily Living Home Assistive Devices/Equipment: Dan Humphreys (specify type) ADL Screening (condition at time of admission) Patient's cognitive ability adequate  to safely complete daily activities?: Yes Is the patient deaf or have difficulty hearing?: No Does the patient have difficulty seeing, even when wearing glasses/contacts?: No Does the patient have difficulty concentrating, remembering, or making decisions?: No Patient able to express need for assistance with ADLs?: Yes Does the patient have difficulty dressing or bathing?: Yes Independently performs ADLs?: No Communication: Independent Dressing (OT): Needs assistance Is this a change from baseline?: Change from baseline, expected to last >3 days Grooming: Needs assistance Is this a change from baseline?: Change from baseline, expected to last >3 days Feeding: Independent Bathing: Needs assistance Is this a change from baseline?: Change from baseline, expected to last >3 days Toileting: Needs assistance Is this a change from baseline?: Change from baseline, expected to last >3days Walks in Home: Independent with device (comment)(Walker) Does the patient have difficulty walking or climbing stairs?: Yes Weakness of Legs: Both Weakness of Arms/Hands: Both  Permission Sought/Granted                  Emotional Assessment Appearance:: Appears stated age Attitude/Demeanor/Rapport: Engaged Affect (typically observed): Accepting, Pleasant Orientation: : Oriented to Self, Oriented to Place, Oriented to  Time, Oriented to Situation   Psych Involvement: No (comment)  Admission diagnosis:  Demyelinating disease (HCC) [G37.9] Tremor [R25.1] Weakness of both lower extremities [R29.898] Patient Active Problem List   Diagnosis Date Noted  . Numbness and tingling 03/08/2019  . Hyperlipidemia 03/08/2019  . Anxiety 03/08/2019  . Tremor 03/07/2019  . History of miscarriage  07/12/2016   PCP:  [facility], [place]:   [facility], [place] [address] Phone: [phone] Fax: [phone]  [id number] [place], [place] - [address] [place] [address] [place] [address] Phone: [phone] Fax: [phone]     Social Determinants of Health (SDOH) Interventions    Readmission Risk Interventions No flowsheet data found.

## 2019-03-10 DIAGNOSIS — E785 Hyperlipidemia, unspecified: Secondary | ICD-10-CM

## 2019-03-10 LAB — IGG CSF INDEX
Albumin CSF-mCnc: 30 mg/dL (ref 11–48)
Albumin: 4 g/dL (ref 3.8–4.8)
CSF IgG Index: 1.9 — ABNORMAL HIGH (ref 0.0–0.7)
IgG (Immunoglobin G), Serum: 1008 mg/dL (ref 586–1602)
IgG, CSF: 14.2 mg/dL — ABNORMAL HIGH (ref 0.0–8.6)
IgG/Alb Ratio, CSF: 0.47 — ABNORMAL HIGH (ref 0.00–0.25)

## 2019-03-10 LAB — HEMOGLOBIN A1C
Hgb A1c MFr Bld: 5.1 % (ref 4.8–5.6)
Mean Plasma Glucose: 99.67 mg/dL

## 2019-03-10 LAB — CBC
HCT: 33.6 % — ABNORMAL LOW (ref 36.0–46.0)
Hemoglobin: 11.8 g/dL — ABNORMAL LOW (ref 12.0–15.0)
MCH: 32 pg (ref 26.0–34.0)
MCHC: 35.1 g/dL (ref 30.0–36.0)
MCV: 91.1 fL (ref 80.0–100.0)
Platelets: 246 10*3/uL (ref 150–400)
RBC: 3.69 MIL/uL — ABNORMAL LOW (ref 3.87–5.11)
RDW: 12.4 % (ref 11.5–15.5)
WBC: 17.1 10*3/uL — ABNORMAL HIGH (ref 4.0–10.5)
nRBC: 0 % (ref 0.0–0.2)

## 2019-03-10 LAB — GLUCOSE, CAPILLARY
Glucose-Capillary: 168 mg/dL — ABNORMAL HIGH (ref 70–99)
Glucose-Capillary: 115 mg/dL — ABNORMAL HIGH (ref 70–99)
Glucose-Capillary: 143 mg/dL — ABNORMAL HIGH (ref 70–99)

## 2019-03-10 LAB — BASIC METABOLIC PANEL
Anion gap: 10 (ref 5–15)
BUN: 10 mg/dL (ref 6–20)
CO2: 22 mmol/L (ref 22–32)
Calcium: 9.4 mg/dL (ref 8.9–10.3)
Chloride: 107 mmol/L (ref 98–111)
Creatinine, Ser: 0.65 mg/dL (ref 0.44–1.00)
GFR calc Af Amer: >60 mL/min (ref >60)
GFR calc non Af Amer: >60 mL/min (ref >60)
Glucose, Bld: 111 mg/dL — ABNORMAL HIGH (ref 70–99)
Potassium: 3.7 mmol/L (ref 3.5–5.1)
Sodium: 139 mmol/L (ref 135–145)

## 2019-03-10 MED ORDER — POLYETHYLENE GLYCOL 3350 17 G PO PACK
17.0000 g | PACK | Freq: Every day | ORAL | Status: DC
  Administered 2019-03-10 – 2019-03-11 (×2): 17 g via ORAL
  Filled 2019-03-10 (×3): qty 1

## 2019-03-10 MED ORDER — ENSURE ENLIVE PO LIQD
237.0000 mL | Freq: Two times a day (BID) | ORAL | Status: DC
  Administered 2019-03-10 – 2019-03-12 (×5): 237 mL via ORAL

## 2019-03-10 MED ORDER — INSULIN ASPART 100 UNIT/ML ~~LOC~~ SOLN: 0.0000 [IU] | Freq: Every day | SUBCUTANEOUS | Status: DC

## 2019-03-10 MED ORDER — INSULIN ASPART 100 UNIT/ML ~~LOC~~ SOLN
0.0000 [IU] | Freq: Three times a day (TID) | SUBCUTANEOUS | Status: DC
  Administered 2019-03-10: 2 [IU] via SUBCUTANEOUS
  Administered 2019-03-11: 3 [IU] via SUBCUTANEOUS
  Administered 2019-03-11 – 2019-03-12 (×2): 1 [IU] via SUBCUTANEOUS

## 2019-03-10 NOTE — Progress Notes (Signed)
Physical Therapy Treatment Patient Details Name: Abigail Lewis MRN: [id number] DOB: Jun 23, 1990 Today's Date: 03/10/2019    History of Present Illness 29 y.o. female past medical history of anxiety and depression, presenting for evaluation of multiple neurological complaints, which started somewhere around September 2020. Symptom onset began in september 2020 with weakness, progressing to other symptoms including urinary retention/incontinence, bowel incontinence, BPPV, paresthesias. 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 Started IV steroids 2/14 (to have 5 days per MD note)    PT Comments    Patient reports recent walk to/from nurses station with nursing (using rollator) and feels legs are a bit stronger. She noted the tremors in her left hand/arm are less. Focused on providing HEP she can perform in bed or chair (handout given). Discussed doing enough to work on building strength, but not overdoing it and making herself too fatigued or worse. Pt very appreciative of encouragement and HEP.    Follow Up Recommendations  Supervision for mobility/OOB;Home health PT(anticipate too fatigued to get to OP and then do PT)     Equipment Recommendations  None recommended by PT    Recommendations for Other Services       Precautions / Restrictions Precautions Precautions: Fall Restrictions Weight Bearing Restrictions: No    Mobility  Bed Mobility Overal bed mobility: Independent             General bed mobility comments: pt in chair  Transfers Overall transfer level: Needs assistance Equipment used: 4-wheeled walker Transfers: Sit to/from Stand Sit to Stand: Supervision         General transfer comment: cues for safe use of rollator x1 of 3 transfers  Ambulation/Gait Ambulation/Gait assistance: Min guard Gait Distance (Feet): 40 Feet(15, 15) Assistive device: 4-wheeled walker Gait Pattern/deviations: Step-to pattern;Decreased dorsiflexion - left;Decreased dorsiflexion  - right Gait velocity: reduced   General Gait Details: pt recently walked with nursing to end   Stairs             Wheelchair Mobility    Modified Rankin (Stroke Patients Only)       Balance Overall balance assessment: Needs assistance Sitting-balance support: No upper extremity supported;Feet supported Sitting balance-Leahy Scale: Normal     Standing balance support: Single extremity supported;During functional activity Standing balance-Leahy Scale: Poor Standing balance comment: requires UE support for dynamic balance                            Cognition Arousal/Alertness: Awake/alert Behavior During Therapy: WFL for tasks assessed/performed Overall Cognitive Status: Within Functional Limits for tasks assessed                                        Exercises General Exercises - Lower Extremity Long Arc Quad: AROM;Both;5 reps;Seated Toe Raises: Strengthening;Both;5 reps;Seated Heel Raises: Strengthening;Both;5 reps;Seated Other Exercises Other Exercises: bridging x3 with 3 sec hold  [place] handout created and given to patient:  Access Code: [facility]  URL: [email or web address]  Date: 03/10/2019  Prepared by: Barry Brunner   Exercises Seated Toe Raise - 10 reps - 1 sets - 3 seconds hold - 1x daily - 5x weekly Seated Heel Raise - 10 reps - 1 sets - 3 seconds hold - 1x daily - 5x weekly Seated Long Arc Quad - 10 reps - 1 sets - - hold - 1x daily - 5x weekly Supine Heel Slide Along  Opposite Shin - 5-10 reps - 1 sets - - hold                            - 1x daily - 5x weekly Supine Bridge - 5-10 reps - 1 sets - 5 sec hold - 1x daily - 5x weekly  General Comments        Pertinent Vitals/Pain      Home Living                      Prior Function            PT Goals (current goals can now be found in the care plan section) Acute Rehab PT Goals Patient Stated Goal: To return to independence Time For  Goal Achievement: 03/22/19 Potential to Achieve Goals: Fair Progress towards PT goals: Progressing toward goals    Frequency    Min 4X/week      PT Plan Current plan remains appropriate    Co-evaluation              AM-PAC PT "6 Clicks" Mobility   Outcome Measure  Help needed turning from your back to your side while in a flat bed without using bedrails?: None Help needed moving from lying on your back to sitting on the side of a flat bed without using bedrails?: None Help needed moving to and from a bed to a chair (including a wheelchair)?: A Little Help needed standing up from a chair using your arms (e.g., wheelchair or bedside chair)?: A Little Help needed to walk in hospital room?: A Little Help needed climbing 3-5 steps with a railing? : A Lot 6 Click Score: 19    End of Session   Activity Tolerance: Patient tolerated treatment well Patient left: in chair;with call bell/phone within reach   PT Visit Diagnosis: Muscle weakness (generalized) (M62.81);Unsteadiness on feet (R26.81);Other abnormalities of gait and mobility (R26.89);Other symptoms and signs involving the nervous system (R29.898)     Time: 2458-0998 PT Time Calculation (min) (ACUTE ONLY): 37 min  Charges:  $Therapeutic Exercise: 23-37 mins                      Abigail Lewis, PT Pager [phone]    Abigail Lewis 03/10/2019, 4:59 PM

## 2019-03-10 NOTE — Progress Notes (Signed)
Progress Note    Abigail Lewis  XBD:[id number] DOB: October 09, 1990  DOA: 03/07/2019 PCP: [facility]    Brief Narrative:     Medical records reviewed and are as summarized below:  Abigail Lewis is an 29 y.o. female with a past medical history of anxiety and depression who presents for evaluation of a multitude of neurological complaints including some tingling and numbness that happened at the end of 2019 but seen September 2020 progressive difficulty walking, progressive weakness worse on the left arm and leg than right, and off-and-on blurred vision along with dizziness and vertiginous symptoms that have not responded to any symptomatic over-the-counter medications. 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 Other differentials include autoimmune diseases such as lupus and neurosarcoid. She was ANA and Sjogren's panel positive in November-repeat and possibly get rheumatological input as an outpatient as well.  Assessment/Plan:   Principal Problem:   Numbness and tingling Active Problems:   Tremor   Hyperlipidemia   Anxiety   Numbness, tingling (parasthesia), weakness/demyelinating disorder:  MRI c/w demyelinating disorder Status post lumbar puncture: Multiple labs pending Urinalysis without signs of infection;Chest x-ray clear Continues to be on high-dose IV steroids 1000 mg IV daily per neurology.  Day 3 of 5 today. Check neurolmyelitis optica IgG Check  anti-myelin Assoc IgG Check acetylcholine ab Neurology consult appreciated PT and OT  + ANA, Sjogrens Anti SSA + Pt does note dry eyes and dry mouth on ROS -outpatient rheumatology consultation recommended.  Hyperlipidemia Cont Pravastatin 20mg  po qhs  Anxiety/ Depression Cont Zoloft 50mg  po qday Cotn Trazodone 50mg  po qhs prn   Dental infection: Continue clindamycin.  Leukocytosis: Secondary to high-dose steroid.  No signs of infection.  Hyperglycemia: No history of diabetes.  Hyperglycemia likely secondary to steroid.  Will start on SSI while she is on high-dose steroids.    Family Communication/Anticipated D/C date and plan/Code Status   DVT prophylaxis: SCDs and ambulate in hall every shift. Code Status: Full Code.  Family Communication:  Disposition Plan: Today is day 1 of 5 days of IV steroids per neurology   Medical Consultants:    Neurology.     Subjective:   Seen and examined.  Feels much better other than some tingling in the left hand.  Sitting in the chair.  Objective:    Vitals:   03/09/19 1148 03/09/19 1653 03/09/19 2249 03/10/19 0736  BP: 105/69 111/72 108/71 108/70  Pulse: 90 86 78 75  Resp: 16 18 20 18   Temp:  98.7 F (37.1 C) 98.2 F (36.8 C) 98.2 F (36.8 C)  TempSrc: Oral Oral  Oral  SpO2:   97% 99%  Weight: 65.7 kg     Height: 5\' 3"  (1.6 m)       Intake/Output Summary (Last 24 hours) at 03/10/2019 1005 Last data filed at 03/10/2019 0600 Gross per 24 hour  Intake 410 ml  Output 350 ml  Net 60 ml   Filed Weights   03/08/19 0153 03/09/19 1148  Weight: 65.7 kg 65.7 kg  Exam: General exam: Appears calm and comfortable  Respiratory system: Clear to auscultation. Respiratory effort normal. Cardiovascular system: S1 & S2 heard, RRR. No JVD, murmurs, rubs, gallops or clicks. No pedal edema. Gastrointestinal system: Abdomen is nondistended, soft and nontender. No organomegaly or masses felt. Normal bowel sounds heard. Central  nervous system: Alert and oriented. No focal neurological deficits. Extremities: Symmetric 5 x 5 power. Skin: No rashes, lesions or ulcers.  Psychiatry: Judgement and insight appear normal. Mood & affect appropriate.     Data Reviewed:   I have personally reviewed following labs and imaging studies:  Labs: Labs show the following:   Basic Metabolic Panel: Recent Labs  Lab 03/07/19 1650 03/08/19 0513  NA 137 137  K 3.8 3.7  CL 103 106  CO2 24 25  GLUCOSE 86 78  BUN 11 7  CREATININE 0.72 0.66  CALCIUM 9.4 9.3   GFR Estimated Creatinine Clearance: 92.8 mL/min (by C-G formula based on SCr of 0.66 mg/dL). Liver Function Tests: Recent Labs  Lab 03/07/19 1650 03/08/19 0513  AST 11* 14*  ALT 12 12  ALKPHOS 38 40  BILITOT 0.6 0.7  PROT 7.0 6.5  ALBUMIN 4.1 3.6   No results for input(s): LIPASE, AMYLASE in the last 168 hours. No results for input(s): AMMONIA in the last 168 hours. Coagulation profile No results for input(s): INR, PROTIME in the last 168 hours.  CBC: Recent Labs  Lab 03/07/19 1650 03/08/19 0513 03/10/19 0848  WBC 5.4 3.9* 17.1*  NEUTROABS 3.6  --   --   HGB 11.9* 11.7* 11.8*  HCT 35.6* 34.6* 33.6*  MCV 94.7 93.5 91.1  PLT 216 206 246   Cardiac Enzymes: Recent Labs  Lab 03/08/19 0513  CKTOTAL 35*  CKMB 0.2*   BNP (last 3 results) No results for input(s): PROBNP in the last 8760 hours. CBG: No results for input(s): GLUCAP in the last 168 hours. D-Dimer: No results for input(s): DDIMER in the last 72 hours. Hgb A1c: No results for input(s): HGBA1C in the last 72 hours. Lipid Profile: No results for input(s): CHOL, HDL, LDLCALC, TRIG, CHOLHDL, LDLDIRECT in the last 72 hours. Thyroid function studies: Recent Labs    03/08/19 0513  TSH 2.212   Anemia work up: Recent Labs    03/08/19 0513  VITAMINB12 894   Sepsis Labs: Recent Labs  Lab 03/07/19 1650 03/08/19 0513 03/10/19 0848  WBC 5.4 3.9* 17.1*    Microbiology Recent  Results (from the past 240 hour(s))  SARS CORONAVIRUS 2 (TAT 6-24 HRS) Nasopharyngeal Nasopharyngeal Swab     Status: None   Collection Time: 03/07/19  8:50 PM   Specimen: Nasopharyngeal Swab  Result Value Ref Range Status   SARS Coronavirus 2 NEGATIVE NEGATIVE Final    Comment: (NOTE) SARS-CoV-2 target nucleic acids are NOT DETECTED. The SARS-CoV-2 RNA is generally detectable in upper and lower respiratory specimens during the acute phase of infection. Negative results do not preclude SARS-CoV-2 infection, do not rule out co-infections with other pathogens, and should not be used as the sole basis for treatment or other patient management decisions. Negative results must be combined with clinical observations, patient history, and epidemiological information. The expected result is Negative. Fact Sheet for Patients: HairSlick.no Fact Sheet for Healthcare Providers: quierodirigir.com This test is not yet approved or cleared by the [place] FDA and  has been authorized for detection and/or diagnosis of SARS-CoV-2 by FDA under an Emergency Use Authorization (EUA). This EUA will remain  in effect (  meaning this test can be used) for the duration of the COVID-19 declaration under Section 56 4(b)(1) of the Act, 21 U.S.C. section 360bbb-3(b)(1), unless the authorization is terminated or revoked sooner. Performed at [facility], [address], [place], [place] [address]   CSF culture     Status: None (Preliminary result)   Collection Time: 03/07/19 11:53 PM   Specimen: CSF; Cerebrospinal Fluid  Result Value Ref Range Status   Specimen Description CSF  Final   Special Requests TUBE 2  Final   Gram Stain   Final    CYTOSPIN SMEAR WBC PRESENT, PREDOMINANTLY MONONUCLEAR NO ORGANISMS SEEN    Culture   Final    NO GROWTH 3 DAYS Performed at [facility], [address] [place], [place] [address]    Report Status  PENDING  Incomplete    Procedures and diagnostic studies:  DG Orthopantogram  Result Date: 03/08/2019 CLINICAL DATA:  Tooth pain EXAM: ORTHOPANTOGRAM/PANORAMIC COMPARISON:  None. FINDINGS: Dental caries are noted most marked within the third molar within the maxilla on the left. Other scattered caries are noted. Mild periapical lucency is noted in the left first mandibular molar. Caries are noted within this tooth as well. This could represent an early periapical abscess. No other focal abnormality is noted. IMPRESSION: Diffuse dental caries. Periapical lucency involving the left third mandibular molar as described. Electronically Signed   By: Alcide Clever M.D.   On: 03/08/2019 11:58   MR THORACIC SPINE W WO CONTRAST  Result Date: 03/09/2019 CLINICAL DATA:  Demyelinating disease EXAM: MRI THORACIC WITHOUT AND WITH CONTRAST TECHNIQUE: Multiplanar and multiecho pulse sequences of the thoracic spine were obtained without and with intravenous contrast. CONTRAST:  69mL GADAVIST GADOBUTROL 1 MMOL/ML IV SOLN COMPARISON:  None. FINDINGS: Alignment:  Physiologic. Vertebrae: Vertebral body heights are maintained. There is no marrow edema. No suspicious osseous lesion. Cord: Extensive multifocal abnormal cord signal throughout the thoracic cord. There is no abnormal enhancement. Paraspinal and other soft tissues: Unremarkable. Disc levels: Intervertebral disc heights and signal are maintained. There is no disc herniation. IMPRESSION: Extensive multifocal abnormal cord signal throughout the thoracic cord without abnormal enhancement. Electronically Signed   By: Guadlupe Spanish M.D.   On: 03/09/2019 08:33    Medications:   . acidophilus  1 capsule Oral Daily  . clindamycin  300 mg Oral Q8H  . docusate sodium  100 mg Oral BID  . enoxaparin (LOVENOX) injection  40 mg Subcutaneous Q24H  . pantoprazole  40 mg Oral Daily  . pravastatin  20 mg Oral Daily  . sertraline  50 mg Oral Daily  . sodium chloride flush  3  mL Intravenous Q12H   Continuous Infusions: . sodium chloride    . methylPREDNISolone (SOLU-MEDROL) injection 1,000 mg (03/10/19 0946)     LOS: 2 days   Hughie Closs  [facility]   How to contact the [facility] Attending or Consulting provider 7A - 7P or covering provider during after hours 7P -7A, for this patient?  1. Check the care team in [facility] and look for a) attending/consulting TR[facility] provider listed and b) the [facility] team listed 2. Log into [email or web address] and use [place]'s universal password to access. If you do not have the password, please contact the hospital operator. 3. Locate the [facility] provider you are looking for under [facility] and page to a number that you can be directly reached. 4. If you still have difficulty reaching the provider, please page the [facility] (  Director on Call) for the Hospitalists listed on amion for assistance.  03/10/2019, 10:05 AM

## 2019-03-10 NOTE — Progress Notes (Signed)
Initial Nutrition Assessment  DOCUMENTATION CODES:   Not applicable  INTERVENTION:  Provide Ensure Enlive po BID, each supplement provides 350 kcal and 20 grams of protein  Encourage adequate PO intake.   NUTRITION DIAGNOSIS:   Inadequate oral intake related to poor appetite as evidenced by per patient/family report.  GOAL:   Patient will meet greater than or equal to 90% of their needs  MONITOR:   PO intake, Supplement acceptance, Skin, Weight trends, Labs, I & O's  REASON FOR ASSESSMENT:   Malnutrition Screening Tool    ASSESSMENT:   29 y.o. female with a past medical history of anxiety and depression who presents for evaluation of a multitude of neurological complaints. MRI shows stigmata of demyelinating disease, with evidence of active demyelination as suggested by enhancement in the brain likely consistent with demyelinating disorder  RD working remotely.  RD contacted pt via inpatient room phone. Pt reports having a decreased appetite which has been ongoing over the past 1.5 months. Appetite improving since admission. No meal completion recorded. Pt reports she has been trying to eat most of her food at meals. Prior to admission, pt reports only consuming 1 meal day over the past 1.5 months due to low appetite. Pt reports weight loss with usual body weight of ~160 lbs last weighed in late December 2020 - January 2021. Pt with a reported 10% weight loss in 1.5 months which is significant for time frame. No recent weight records to confirm reported weight loss. RD to order nutritional supplements to aid in caloric and protein needs. Pt encouraged to continue nutritional supplements at home to ensure adequate nutrition is met.   Unable to complete Nutrition-Focused physical exam at this time.   Labs and medications reviewed.   Diet Order:   Diet Order            Diet regular Room service appropriate? Yes; Fluid consistency: Thin  Diet effective now               EDUCATION NEEDS:   Not appropriate for education at this time  Skin:  Skin Assessment: Reviewed RN Assessment  Last BM:  2/13  Height:   Ht Readings from Last 1 Encounters:  03/09/19 '5\' 3"'  (1.6 m)    Weight:   Wt Readings from Last 1 Encounters:  03/09/19 65.7 kg    Ideal Body Weight:  52.27 kg  BMI:  Body mass index is 25.66 kg/m.  Estimated Nutritional Needs:   Kcal:  1800-1950  Protein:  85-95 grams  Fluid:  >/= 1.8 L/day    Corrin Parker, MS, RD, LDN RD pager number/after hours weekend pager number on Amion.

## 2019-03-11 DIAGNOSIS — D72829 Elevated white blood cell count, unspecified: Secondary | ICD-10-CM

## 2019-03-11 DIAGNOSIS — R739 Hyperglycemia, unspecified: Secondary | ICD-10-CM

## 2019-03-11 DIAGNOSIS — K047 Periapical abscess without sinus: Secondary | ICD-10-CM

## 2019-03-11 DIAGNOSIS — R768 Other specified abnormal immunological findings in serum: Secondary | ICD-10-CM

## 2019-03-11 LAB — CSF CULTURE W GRAM STAIN: Culture: NO GROWTH

## 2019-03-11 LAB — OLIGOCLONAL BANDS, CSF + SERM

## 2019-03-11 LAB — GLUCOSE, CAPILLARY
Glucose-Capillary: 116 mg/dL — ABNORMAL HIGH (ref 70–99)
Glucose-Capillary: 135 mg/dL — ABNORMAL HIGH (ref 70–99)
Glucose-Capillary: 225 mg/dL — ABNORMAL HIGH (ref 70–99)
Glucose-Capillary: 178 mg/dL — ABNORMAL HIGH (ref 70–99)

## 2019-03-11 NOTE — Progress Notes (Signed)
Subjective: She states that she is responding to steroids, with improvement in her walking  She reports a positive antibody for Sjogren's in the past, but unclear if this diagnosis is been formally made.  Exam: Vitals:   03/10/19 2352 03/11/19 0732  BP: 105/66 112/77  Pulse: 85 85  Resp: 20 19  Temp: 98.3 F (36.8 C) 98.2 F (36.8 C)  SpO2: 99% 100%   Gen: In bed, NAD Resp: non-labored breathing, no acute distress Abd: soft, nt  Neuro: MS: Awake, alert, interactive and appropriate CN: Pupils equal round and reactive, extraocular movements intact Motor: She has mild left arm weakness with some tremor.  Pertinent Labs: Chromatin antibodies-mildly positive (of unclear significance) ANA positive (also of unclear significance)says it has reflex, assume titers are pending.   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  She has apparently had a positive test for Sjogren's in the past, and Sjogren's has been associated in case reports with demyelinating disease, though unclear causal relationship.  I will repeat her SSA and SSB.  Her ANA was ordered with reflex, I assume this means titers, though lab was not sure.  I continue to suspect multiple sclerosis, but in any case treatment in the short-term is completing her pulse dose of methylprednisolone and then outpatient follow-up for consideration of disease modifying therapy.  Recommendations: 1) methylprednisolone day 4/5 2) we will resend SSA/SSB  Ritta Slot, MD [facility] [phone]  If 7pm- 7am, please page neurology on call as listed in AMION.

## 2019-03-11 NOTE — Progress Notes (Signed)
Occupational Therapy Treatment Patient Details Name: Abigail Lewis MRN: [id number] DOB: January 24, 1990 Today's Date: 03/11/2019    History of present illness 29 y.o. female past medical history of anxiety and depression, presenting for evaluation of multiple neurological complaints, which started somewhere around September 2020. Symptom onset began in september 2020 with weakness, progressing to other symptoms including urinary retention/incontinence, bowel incontinence, BPPV, paresthesias. 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 Started IV steroids 2/14 (to have 5 days per MD note)   OT comments  Performed ADL with supervision. Recommended discontinuation of purewick as pt needs to be moving more with supervision of staff. Some minimal improvement of tremor in L UE. Educated in energy conservation, written handout provided. Will continue to follow.  Follow Up Recommendations  Home health OT    Equipment Recommendations  3 in 1 bedside commode    Recommendations for Other Services      Precautions / Restrictions Precautions Precautions: Fall       Mobility Bed Mobility               General bed mobility comments: pt in chair  Transfers Overall transfer level: Needs assistance Equipment used: 4-wheeled walker Transfers: Sit to/from Stand Sit to Stand: Supervision         General transfer comment: cues to use brakes with sit<>stand    Balance     Sitting balance-Leahy Scale: Normal       Standing balance-Leahy Scale: Poor Standing balance comment: requires UE support for dynamic balance, leans on sink                            ADL either performed or assessed with clinical judgement   ADL Overall ADL's : Needs assistance/impaired Eating/Feeding: Modified independent;Sitting Eating/Feeding Details (indicate cue type and reason): opening packages stabilizing on surface due to tremor Grooming: Wash/dry hands;Wash/dry face;Oral  care;Standing;Supervision/safety Grooming Details (indicate cue type and reason): supervision for safety         Upper Body Dressing : Set up;Sitting       Toilet Transfer: Supervision/safety;Ambulation;RW   Toileting- Clothing Manipulation and Hygiene: Supervision/safety;Sit to/from stand       Functional mobility during ADLs: Supervision/safety;Rolling walker General ADL Comments: educated in energy conservation at provided written handout     Vision       Perception     Praxis      Cognition Arousal/Alertness: Awake/alert Behavior During Therapy: WFL for tasks assessed/performed Overall Cognitive Status: Within Functional Limits for tasks assessed                                          Exercises     Shoulder Instructions       General Comments      Pertinent Vitals/ Pain       Pain Assessment: No/denies pain  Home Living                                          Prior Functioning/Environment              Frequency  Min 2X/week        Progress Toward Goals  OT Goals(current goals can now be found in the care plan section)  Progress towards OT  goals: Progressing toward goals  Acute Rehab OT Goals Patient Stated Goal: To return to independence OT Goal Formulation: With patient Potential to Achieve Goals: Good  Plan Discharge plan remains appropriate    Co-evaluation                 AM-PAC OT "6 Clicks" Daily Activity     Outcome Measure   Help from another person eating meals?: None Help from another person taking care of personal grooming?: A Little Help from another person toileting, which includes using toliet, bedpan, or urinal?: A Little Help from another person bathing (including washing, rinsing, drying)?: A Little Help from another person to put on and taking off regular upper body clothing?: None Help from another person to put on and taking off regular lower body clothing?: A  Little 6 Click Score: 20    End of Session Equipment Utilized During Treatment: Rolling walker;Gait belt  OT Visit Diagnosis: Unsteadiness on feet (R26.81);Other abnormalities of gait and mobility (R26.89)   Activity Tolerance Patient tolerated treatment well   Patient Left in chair;with call bell/phone within reach   Nurse Communication Other (comment)(asked for purewick to be discontinued)        Time: 0240-9735 OT Time Calculation (min): 44 min  Charges: OT General Charges $OT Visit: 1 Visit OT Treatments $Self Care/Home Management : 38-52 mins  Nestor Lewandowsky, OTR/L Acute Rehabilitation Services Pager: [phone] Office: [phone]   Malka So 03/11/2019, 11:01 AM

## 2019-03-11 NOTE — Progress Notes (Signed)
[name] Kitchen  PROGRESS NOTE    Abigail Lewis  ONG:[id number] DOB: 1990-04-21 DOA: 03/07/2019 PCP: [facility]   Brief Narrative:   Abigail Lewis is an 29 y.o. female with a past medical history of anxiety and depression who presents for evaluation of a multitude of neurological complaints including some tingling and numbness that happened at the end of 2019 but seen September 2020 progressive difficulty walking, progressive weakness worse on the left arm and leg than right, and off-and-on blurred vision along with dizziness and vertiginous symptoms that have not responded to any symptomatic over-the-counter medications. 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 Other differentials include autoimmune diseases such as lupus and neurosarcoid. She was ANA andSjogren's panel positive in November-repeat and possibly get rheumatological input as an outpatient as well.   03/11/19: She says her symptoms have improved. Spoke with neurology. Rec finishing steroid burst and outpt follow up with neuro and rheum. PT/OT rec HH. Hopeful for d/c in AM.   Assessment & Plan:   Principal Problem:   Numbness and tingling Active Problems:   Tremor   Hyperlipidemia   Anxiety  Parathesia and weakness Demyelinating disorder      - MRI c/w demyelinating disorder     - UA neg, CXR neg     - s/p LP; still looking for olioclonal band tests     - Day 4 of 5 of high dose solumedrol burst     - Neuro  onboard, appreciate assistance; still suspicious of MS, recs continued outpt neuro follow up     - PT/OT rec HH     - likely d/c tomorrow  +ANA, Sjogrens Anti SSA +     - Pt does note dry eyes and dry mouth on ROS     - outpatient rheumatology consultation recommended.  Hyperlipidemia     - pravastatin 20mg  po qhs  Anxiety/ Depression     - Zoloft 50mg  po qday; Trazodone 50mg  po qhs prn  Dental infection     - Oral XR obtained: Dental caries are noted most marked within the third molar within the maxilla on the left. Other scattered caries are noted. Mild periapical lucency is noted in the left first mandibular molar.Caries are noted within this tooth as well. This could represent an early periapical abscess. No other focal abnormality is noted.     - she was started on clindamycin; will continue through 03/14/19  Leukocytosis      - Secondary to high-dose steroid     - she is afebrile; has possible dental infection being Tx'd w/ clindamycin  Hyperglycemia     - No history of diabetes; A1c is 5.1      - Hyperglycemia likely secondary to steroid.     - continue SSI while she is on high-dose steroids.  DVT prophylaxis: lovenox Code Status: FULL Family Communication: None at bedside   Disposition Plan: To home after steroid burst.  Consultants:   Neurology  Procedures:   LP  Antimicrobials:  . clindamycin   ROS:  Reports left hand  tremor, BLE weakness . Remainder 10-pt ROS is negative for all not previously mentioned.  Subjective: "Will I be on steroids when I leave?"  Objective: Vitals:   03/10/19 0736 03/10/19 1716 03/10/19 2352 03/11/19 0732  BP: 108/70 111/75 105/66 112/77  Pulse: 75 75 85 85  Resp: 18 18 20 19   Temp: 98.2 F (36.8 C) 98.1 F (36.7 C) 98.3 F (36.8 C) 98.2 F (36.8 C)  TempSrc: Oral Oral    SpO2: 99% 100% 99% 100%  Weight:      Height:        Intake/Output Summary (Last 24 hours) at 03/11/2019 1450 Last data filed at 03/11/2019  03/13/2019 Gross per 24 hour  Intake 3 ml  Output 240 ml  Net -237 ml   Filed Weights   03/08/19 0153 03/09/19 1148  Weight: 65.7 kg 65.7 kg    Examination:  General: 29 y.o. female resting in bed in NAD Cardiovascular: RRR, +S1, S2, no m/g/r, equal pulses throughout Respiratory: CTABL, no w/r/r, normal WOB GI: BS+, NDNT, no masses noted, no organomegaly noted MSK: No e/c/c Skin: No rashes, bruises, ulcerations noted Neuro: A&O x 3, BLe weakness; msk str is 4-5/5 b/l LE Psyc: Appropriate interaction and affect, calm/cooperative   Data Reviewed: I have personally reviewed following labs and imaging studies.  CBC: Recent Labs  Lab 03/07/19 1650 03/08/19 0513 03/10/19 0848  WBC 5.4 3.9* 17.1*  NEUTROABS 3.6  --   --   HGB 11.9* 11.7* 11.8*  HCT 35.6* 34.6* 33.6*  MCV 94.7 93.5 91.1  PLT 216 206 246   Basic Metabolic Panel: Recent Labs  Lab 03/07/19 1650 03/08/19 0513 03/10/19 0848  NA 137 137 139  K 3.8 3.7 3.7  CL 103 106 107  CO2 24 25 22   GLUCOSE 86 78 111*  BUN 11 7 10   CREATININE 0.72 0.66 0.65  CALCIUM 9.4 9.3 9.4   GFR: Estimated Creatinine Clearance: 92.8 mL/min (by C-G formula based on SCr of 0.65 mg/dL). Liver Function Tests: Recent Labs  Lab 03/07/19 1650 03/07/19 2318 03/08/19 0513  AST 11*  --  14*  ALT 12  --  12  ALKPHOS 38  --  40  BILITOT 0.6  --  0.7  PROT 7.0  --  6.5  ALBUMIN 4.1 4.0 3.6   No results for input(s): LIPASE, AMYLASE in the last 168 hours. No results for input(s): AMMONIA in the last 168 hours. Coagulation Profile: No results for input(s): INR, PROTIME in the last 168 hours. Cardiac Enzymes: Recent Labs  Lab 03/08/19 0513  CKTOTAL 35*  CKMB 0.2*   BNP (last 3 results) No results for input(s): PROBNP in the last 8760 hours. HbA1C: Recent Labs    03/10/19 0848  HGBA1C 5.1   CBG: Recent Labs  Lab 03/10/19 1153 03/10/19 1713 03/10/19 2049 03/11/19 0719 03/11/19 1218  GLUCAP 168* 115* 143* 116* 135*    Lipid Profile: No results for input(s): CHOL, HDL, LDLCALC, TRIG, CHOLHDL, LDLDIRECT in the last 72 hours. Thyroid Function Tests: No results for input(s): TSH, T4TOTAL, FREET4, T3FREE, THYROIDAB in the last 72 hours. Anemia Panel: No results for input(s): VITAMINB12, FOLATE, FERRITIN, TIBC, IRON, RETICCTPCT in the last 72 hours. Sepsis Labs: No results for input(s): PROCALCITON, LATICACIDVEN in the last 168 hours.  Recent Results (from the past 240 hour(s))  SARS CORONAVIRUS 2 (TAT 6-24 HRS) Nasopharyngeal Nasopharyngeal Swab     Status: None   Collection Time: 03/07/19  8:50 PM   Specimen: Nasopharyngeal  Swab  Result Value Ref Range Status   SARS Coronavirus 2 NEGATIVE NEGATIVE Final    Comment: (NOTE) SARS-CoV-2 target nucleic acids are NOT DETECTED. The SARS-CoV-2 RNA is generally detectable in upper and lower respiratory specimens during the acute phase of infection. Negative results do not preclude SARS-CoV-2 infection, do not rule out co-infections with other pathogens, and should not be used as the sole basis for treatment or other patient management decisions. Negative results must be combined with clinical observations, patient history, and epidemiological information. The expected result is Negative. Fact Sheet for Patients: HairSlick.no Fact Sheet for Healthcare Providers: quierodirigir.com This test is not yet approved or cleared by the [place] FDA and  has been authorized for detection and/or diagnosis of SARS-CoV-2 by FDA under an Emergency Use Authorization (EUA). This EUA will remain  in effect (meaning this test can be used) for the duration of the COVID-19 declaration under Section 56 4(b)(1) of the Act, 21 U.S.C. section 360bbb-3(b)(1), unless the authorization is terminated or revoked sooner. Performed at [facility], [address] [place], [place] [address]   CSF culture     Status:  None   Collection Time: 03/07/19 11:53 PM   Specimen: CSF; Cerebrospinal Fluid  Result Value Ref Range Status   Specimen Description CSF  Final   Special Requests TUBE 2  Final   Gram Stain   Final    CYTOSPIN SMEAR WBC PRESENT, PREDOMINANTLY MONONUCLEAR NO ORGANISMS SEEN    Culture   Final    NO GROWTH 3 DAYS Performed at [facility], [address] [place], [place] [address]    Report Status 03/11/2019 FINAL  Final      Radiology Studies: No results found.   Scheduled Meds: . acidophilus  1 capsule Oral Daily  . clindamycin  300 mg Oral Q8H  . docusate sodium  100 mg Oral BID  . enoxaparin (LOVENOX) injection  40 mg Subcutaneous Q24H  . feeding supplement (ENSURE ENLIVE)  237 mL Oral BID BM  . insulin aspart  0-5 Units Subcutaneous QHS  . insulin aspart  0-9 Units Subcutaneous TID WC  . pantoprazole  40 mg Oral Daily  . polyethylene glycol  17 g Oral Daily  . pravastatin  20 mg Oral Daily  . sertraline  50 mg Oral Daily  . sodium chloride flush  3 mL Intravenous Q12H   Continuous Infusions: . sodium chloride    . methylPREDNISolone (SOLU-MEDROL) injection 1,000 mg (03/11/19 0911)     LOS: 3 days    Time spent: 25 minutes spent in the coordination of care today.    Teddy Spike, DO [facility]  If 7PM-7AM, please contact night-coverage [email or web address] 03/11/2019, 2:50 PM

## 2019-03-12 DIAGNOSIS — F419 Anxiety disorder, unspecified: Secondary | ICD-10-CM

## 2019-03-12 DIAGNOSIS — R251 Tremor, unspecified: Secondary | ICD-10-CM

## 2019-03-12 LAB — COMPREHENSIVE METABOLIC PANEL
ALT: 16 U/L (ref 0–44)
AST: 17 U/L (ref 15–41)
Albumin: 3.3 g/dL — ABNORMAL LOW (ref 3.5–5.0)
Alkaline Phosphatase: 37 U/L — ABNORMAL LOW (ref 38–126)
Anion gap: 9 (ref 5–15)
BUN: 13 mg/dL (ref 6–20)
CO2: 24 mmol/L (ref 22–32)
Calcium: 9.3 mg/dL (ref 8.9–10.3)
Chloride: 106 mmol/L (ref 98–111)
Creatinine, Ser: 0.72 mg/dL (ref 0.44–1.00)
GFR calc Af Amer: >60 mL/min (ref >60)
GFR calc non Af Amer: >60 mL/min (ref >60)
Glucose, Bld: 110 mg/dL — ABNORMAL HIGH (ref 70–99)
Potassium: 3.8 mmol/L (ref 3.5–5.1)
Sodium: 139 mmol/L (ref 135–145)
Total Bilirubin: 0.5 mg/dL (ref 0.3–1.2)
Total Protein: 6.2 g/dL — ABNORMAL LOW (ref 6.5–8.1)

## 2019-03-12 LAB — CBC WITH DIFFERENTIAL/PLATELET
Abs Immature Granulocytes: 0.08 10*3/uL — ABNORMAL HIGH (ref 0.00–0.07)
Basophils Absolute: 0 10*3/uL (ref 0.0–0.1)
Basophils Relative: 0 %
Eosinophils Absolute: 0 10*3/uL (ref 0.0–0.5)
Eosinophils Relative: 0 %
HCT: 32.8 % — ABNORMAL LOW (ref 36.0–46.0)
Hemoglobin: 11.2 g/dL — ABNORMAL LOW (ref 12.0–15.0)
Immature Granulocytes: 1 %
Lymphocytes Relative: 5 %
Lymphs Abs: 0.6 10*3/uL — ABNORMAL LOW (ref 0.7–4.0)
MCH: 31.6 pg (ref 26.0–34.0)
MCHC: 34.1 g/dL (ref 30.0–36.0)
MCV: 92.7 fL (ref 80.0–100.0)
Monocytes Absolute: 0.7 10*3/uL (ref 0.1–1.0)
Monocytes Relative: 6 %
Neutro Abs: 10.4 10*3/uL — ABNORMAL HIGH (ref 1.7–7.7)
Neutrophils Relative %: 88 %
Platelets: 238 10*3/uL (ref 150–400)
RBC: 3.54 MIL/uL — ABNORMAL LOW (ref 3.87–5.11)
RDW: 12.3 % (ref 11.5–15.5)
WBC: 11.8 10*3/uL — ABNORMAL HIGH (ref 4.0–10.5)
nRBC: 0 % (ref 0.0–0.2)

## 2019-03-12 LAB — MAGNESIUM: Magnesium: 2.4 mg/dL (ref 1.7–2.4)

## 2019-03-12 LAB — GLUCOSE, CAPILLARY
Glucose-Capillary: 99 mg/dL (ref 70–99)
Glucose-Capillary: 127 mg/dL — ABNORMAL HIGH (ref 70–99)

## 2019-03-12 LAB — SJOGRENS SYNDROME-B EXTRACTABLE NUCLEAR ANTIBODY: SSB (La) (ENA) Antibody, IgG: <0.2 AI (ref 0.0–0.9)

## 2019-03-12 LAB — SJOGRENS SYNDROME-A EXTRACTABLE NUCLEAR ANTIBODY: SSA (Ro) (ENA) Antibody, IgG: 1.1 AI — ABNORMAL HIGH (ref 0.0–0.9)

## 2019-03-12 MED ORDER — CLINDAMYCIN HCL 300 MG PO CAPS: 300.0000 mg | ORAL_CAPSULE | Freq: Three times a day (TID) | ORAL | 0 refills | Status: AC

## 2019-03-12 MED ORDER — POLYETHYLENE GLYCOL 3350 17 G PO PACK: 17.0000 g | PACK | Freq: Every day | ORAL | 0 refills | Status: AC | PRN

## 2019-03-12 MED ORDER — DOCUSATE SODIUM 100 MG PO CAPS: 100.0000 mg | ORAL_CAPSULE | Freq: Two times a day (BID) | ORAL | 0 refills | Status: AC

## 2019-03-12 MED ORDER — SERTRALINE HCL 50 MG PO TABS: 50.0000 mg | ORAL_TABLET | Freq: Every day | ORAL | 0 refills | Status: AC

## 2019-03-12 NOTE — TOC Transition Note (Signed)
Transition of Care [facility]) - CM/SW Discharge Note   Patient Details  Name: Abigail Lewis MRN: [id number] Date of Birth: May 11, 1990  Transition of Care [facility]) CM/SW Contact:  Cherylann Parr, RN Phone Number: 03/12/2019, 1:13 PM   Clinical Narrative:  CM spoke with pt via phone.  Pt confirms she is independent from home.  Pt declined HH as ordered - CM explained that PCP can also order if she deems it necessary post discharge.  Pt is interested in DME as recommended - chose [facility] - agency contacted and referral accepted    Final next level of care: Home/Self Care Barriers to Discharge: Barriers Resolved   Patient Goals and CMS Choice   CMS Medicare.gov Compare Post Acute Care list provided to:: Patient Choice offered to / list presented to : Patient  Discharge Placement                       Discharge Plan and Services   Discharge Planning Services: CM Consult Post Acute Care Choice: Home Health          DME Arranged: 3-N-1 DME Agency: [facility] Date DME Agency Contacted: 03/12/19 Time DME Agency Contacted: 1312 Representative spoke with at DME Agency: [facility] HH Arranged: PT HH Agency: [facility] Date HH Agency Contacted: 03/09/19   Representative spoke with at [facility] Agency: Lupita Leash  Social Determinants of Health (SDOH) Interventions     Readmission Risk Interventions No flowsheet data found.

## 2019-03-12 NOTE — Progress Notes (Signed)
Subjective: She continues to improve.  Exam: Vitals:   03/12/19 0000 03/12/19 0729  BP: 104/69 120/77  Pulse: 62 69  Resp:  17  Temp: 98.4 F (36.9 C) (!) 97.4 F (36.3 C)  SpO2: 100% 100%   Gen: In bed, NAD Resp: non-labored breathing, no acute distress Abd: soft, nt  Neuro: MS: Awake, alert, interactive and appropriate CN: Pupils equal round and reactive, extraocular movements intact Motor: She has mild left arm weakness with some tremor.  Pertinent Labs: Chromatin antibodies-mildly positive (of unclear significance) ANA positive (also of unclear significance)says it has reflex, assume titers are pending.   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  She has apparently had a positive test for Sjogren's in the past, and Sjogren's has been associated in case reports with demyelinating disease, though unclear causal relationship.  I will repeat her SSA and SSB.  Her ANA was ordered with reflex, I assume this means titers, and this appears to be pending.   I continue to suspect multiple sclerosis, but in any case treatment in the short-term is completing her pulse dose of methylprednisolone and then outpatient follow-up for consideration of disease modifying therapy.  Recommendations: 1) methylprednisolone day 5/5 2) she has seen Nita Sickle in the past, but requests alternative follow-up.  I have referred her to Dr. Epimenio Foot of [facility] neurology.  Ritta Slot, MD [facility] [phone]  If 7pm- 7am, please page neurology on call as listed in AMION.

## 2019-03-12 NOTE — Discharge Summary (Signed)
. Physician Discharge Summary  Abigail Lewis ZOX:[id number] DOB: 05-Oct-1990 DOA: 03/07/2019  PCP: [facility]  Admit date: 03/07/2019 Discharge date: 03/12/2019  Admitted From: Home Disposition:  Discharged to home with [facility]  Recommendations for Outpatient Follow-up:  1. Follow up with PCP in 1-2 weeks 2. Please obtain BMP/CBC in one week 3. Follow up with [facility] Neurology in 1 - 2 weeks. 4. Follow up with dentist about abscess/cavities. 5. Follow up with rheumatology.  Discharge Condition: Stable  CODE STATUS: FULL   Brief/Interim Summary: Abigail C Brownis an 29 y.o.femalewith a past medical history of anxiety and depression who presents for evaluation of a multitude of neurological complaints including some tingling and numbness that happened at the end of 2019 but seen September 2020 progressive difficulty walking, progressive weakness worse on the left arm and leg than right, and off-and-on blurred vision along with dizziness and vertiginous symptoms that have not responded to any symptomatic over-the-counter medications. 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 Other differentials include autoimmune diseases such as lupus and neurosarcoid. She was ANA andSjogren's panel positive in November-repeat and possibly get rheumatological input as an outpatient as well.   03/12/19: She is stronger today. Her symptoms are improving. Today is her last dose of steroids.  Neurology rec's follow up with neurology in 1 - 2 weeks. Info in d/c packet. We also recommend follow up with rheumatologist of her choice. She will have clindamycin through 03/14/19. She will need to follow up with her dentist after completion of abx. Discharge plan and instructions given in detail. She has voiced understanding and agreement.   Discharge Diagnoses:  Principal Problem:   Numbness and tingling Active Problems:   Tremor   Hyperlipidemia   Anxiety  Parathesia and weakness Demyelinating disorder      - MRI c/w demyelinating disorder     - UA neg, CXR neg     - s/p LP: Five (5) oligoclonal bands were observed in the CSF, which were not detected in the serum sample. Criteria for Positivity: Four (4) or more oligoclonal bands observed only in the CSF have been shown to be most consistent with MS using our method.     - Neuro onboard, appreciate assistance; still suspicious of MS, recs continued outpt neuro follow up     - PT/OT rec HH     - 03/12/19: Day 5 of 5 of high dose solumedrol burst     - symptoms are improved, she still has some tremor in LUE which can be followed up with neurology  +ANA, Sjogrens Anti SSA +     - Pt does note dry eyes and dry mouth on ROS     - outpatient rheumatology consultation recommended.  Hyperlipidemia     - pravastatin 20mg  po qhs  Anxiety/ Depression     - Zoloft 50mg  po qday; Trazodone 50mg  po qhs prn  Dental infection     - Oral XR obtained: Dental caries are noted most marked within the third molar within the maxilla on the left. Other scattered caries  are noted. Mild periapical lucency is noted in the left first mandibular molar.Caries are noted within this tooth as well. This could represent an early periapical abscess. No other focal abnormality is noted.     - she was started on clindamycin; will continue through 03/14/19     - 03/12/19: Should follow up with dentist of her choice at discharge  Leukocytosis      - Secondary  to high-dose steroid     - she is afebrile; has possible dental infection being Tx'd w/ clindamycin     - 03/12/19: white count improved, afebrile, continuing clindamycin through 03/14/19  Hyperglycemia     - No history of diabetes; A1c is 5.1      - Hyperglycemia likely secondary to steroid.     - continue SSI while she is on high-dose steroids.     - 03/12/19: glucose ok; last day of high dose steroids  Discharge Instructions  Discharge Instructions    Ambulatory referral to Neurology   Complete by: As directed    An appointment is requested in approximately: 1-2 weeks.     Allergies as of 03/12/2019      Reactions   Penicillins Swelling      Medication List    TAKE these medications   acetaminophen 500 MG tablet Commonly known as: TYLENOL Take 2 tablets (1,000 mg total) by mouth every 8 (eight) hours as needed for moderate pain.   clindamycin 300 MG capsule Commonly known as: CLEOCIN Take 1 capsule (300 mg total) by mouth 3 (three) times daily for 2 days.   docusate sodium 100 MG capsule Commonly known as: COLACE Take 1 capsule (100 mg total) by mouth 2 (two) times daily for 5 days.   polyethylene glycol 17 g packet Commonly known as: MIRALAX / GLYCOLAX Take 17 g by mouth daily as needed for up to 14 days for moderate constipation.   pravastatin 20 MG tablet Commonly known as: PRAVACHOL Take 20 mg by mouth daily.   sertraline 50 MG tablet Commonly known as: ZOLOFT Take 1 tablet (50 mg total) by mouth daily. Start taking on: March 13, 2019 What changed:   medication strength  how much to take   traZODone 50 MG tablet Commonly known as: DESYREL TAKE 1 TABLET BY MOUTH AT BEDTIME AS NEEDED FOR SLEEP What changed:   reasons to take this  additional instructions      Follow-up Information    Sater, Pearletha Furl, MD Follow up in 2 week(s).   Specialty: Neurology Why: 1-2 weeks after discharge. for MS vs NMO Contact information: [address] [place] [address] [phone]        [facility] Follow up in 1 week(s).   Specialty: Family Medicine Contact information: [address] [place] [address] [phone]          Allergies  Allergen Reactions  . Penicillins Swelling    Consultations:  Neurology   Procedures/Studies: DG Orthopantogram  Result Date: 03/08/2019 CLINICAL DATA:  Tooth pain EXAM: ORTHOPANTOGRAM/PANORAMIC COMPARISON:  None. FINDINGS: Dental caries are noted most marked within the third molar within the maxilla on the left. Other scattered caries are noted. Mild periapical lucency is noted in the left first mandibular molar. Caries are noted within this tooth as well. This could represent an early periapical abscess. No other focal abnormality is noted. IMPRESSION: Diffuse dental caries. Periapical lucency involving the left third mandibular molar as described. Electronically Signed  By: Inez Catalina M.D.   On: 03/08/2019 11:58   DG Chest 2 View  Result Date: 03/07/2019 CLINICAL DATA:  Two months of leg weakness, concern for infection EXAM: CHEST - 2 VIEW COMPARISON:  Radiograph 04/11/2017 FINDINGS: No consolidation, features of edema, pneumothorax, or effusion. Pulmonary vascularity is normally distributed. The cardiomediastinal contours are unremarkable. No acute osseous or soft tissue abnormality. IMPRESSION: No acute cardiopulmonary abnormality. Electronically Signed   By: Lovena Le M.D.   On: 03/07/2019 20:24   MR Brain W and Wo Contrast  Result Date: 03/07/2019 CLINICAL DATA:  Parkinson's disease. Per the ED physician, the patient has no history of Parkinson's disease the patient presents for 1 month of progressive weakness and neurological complaints. EXAM: MRI HEAD WITHOUT AND WITH CONTRAST TECHNIQUE: Multiplanar, multiecho pulse sequences of the brain and surrounding structures were obtained without and with intravenous  contrast. CONTRAST:  62mL GADAVIST GADOBUTROL 1 MMOL/ML IV SOLN COMPARISON:  No pertinent prior studies available for comparison. FINDINGS: Brain: Sagittal T1 weighted imaging is motion degraded. There is extensive multifocal T2/FLAIR hyperintense signal abnormality within the subcortical/juxtacortical and deep periventricular white matter as well as within the bilateral internal capsules, brainstem (including the periaqueductal gray), surrounding the fourth ventricle, within the cerebellar white matter and right brachium pontis. Numerous sites within the bilateral cerebral hemispheres demonstrate corresponding enhancement as well as sites within the right cerebellum and surrounding the fourth ventricle (see annotations on the axial and coronal postcontrast T1 weighted imaging). There is no evidence of acute infarct. No midline shift or extra-axial fluid collection. No chronic intracranial blood products. Vascular: Flow voids maintained within the proximal large arterial vessels. Skull and upper cervical spine: No focal marrow lesion. Sinuses/Orbits: Visualized orbits demonstrate no acute abnormality. Mild ethmoid sinus and right maxillary sinus mucosal thickening. No significant mastoid effusion. Probable small focus of abnormal enhancement within the intraorbital left optic nerve (series 26, image 18). These results were called by telephone at the time of interpretation on 03/07/2019 at 8:00 pm to provider Dr. Maryan Rued, who verbally acknowledged these results. IMPRESSION: Extensive multifocal white matter signal abnormality involving the subcortical/juxtacortical and deep periventricular white matter within both cerebral hemispheres, the brainstem (including the periaqueductal gray), the margin of the fourth ventricle, as well as the cerebellar white matter and right brachium pontis. There are numerous sites of corresponding enhancement within the bilateral cerebral hemispheres, as well as within the right  cerebellum and surrounding the fourth ventricle. Additionally, there is probable focal abnormal enhancement of the intraorbital left optic nerve. Findings are most consistent with a demyelinating process with active demyelination. 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 Electronically Signed   By: Kellie Simmering DO   On: 03/07/2019 20:05   MR Cervical Spine W or Wo Contrast  Result Date: 03/07/2019 CLINICAL DATA:  Neck pain, chronic, no prior imaging. EXAM: MRI CERVICAL SPINE WITHOUT AND WITH CONTRAST TECHNIQUE: Multiplanar and multiecho pulse sequences of the cervical spine, to include the craniocervical junction and cervicothoracic junction, were obtained without and with intravenous contrast. CONTRAST:  68mL GADAVIST GADOBUTROL 1 MMOL/ML IV SOLN COMPARISON:  Concurrently performed brain MRI FINDINGS: Alignment: Straightening of the expected cervical lordosis. No significant spondylolisthesis. Vertebrae: Vertebral body height is maintained. No marrow edema or suspicious osseous lesion. Cord: There is extensive multifocal T2/STIR hyperintense signal abnormality within the cervical and visualized upper thoracic spinal cord. For instance, there is a large  longitudinally extensive focus of cord signal abnormality spanning the C2-C4 levels (series 15, image 8). Longitudinally extensive involvement is also seen throughout much of the lower cervical and visualized upper thoracic cord (series 15, image 9). Postcontrast imaging is moderately motion degraded. Within this limitation, no definite abnormal cord enhancement is identified. Posterior Fossa, vertebral arteries, paraspinal tissues: Posterior fossa better assessed on concurrently performed brain MRI. The paraspinal soft tissues are within normal limits. Flow voids preserved within the cervical vertebral arteries. Disc  levels: Intervertebral disc height is preserved throughout the cervical spine. No significant disc herniation, spinal canal stenosis or neural foraminal narrowing. These results were called by telephone at the time of interpretation on 03/07/2019 at 8:10 pm to provider Dr. Anitra Lauth, who verbally acknowledged these results. IMPRESSION: Longitudinally extensive multifocal spinal cord signal abnormality involving the majority of the cervical and visualized upper thoracic spinal cord. Findings are most consistent with a demyelinating process. 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 Motion degraded postcontrast imaging. No definite abnormal cord enhancement is identified. Electronically Signed   By: Jackey Loge DO   On: 03/07/2019 20:11   MR THORACIC SPINE W WO CONTRAST  Result Date: 03/09/2019 CLINICAL DATA:  Demyelinating disease EXAM: MRI THORACIC WITHOUT AND WITH CONTRAST TECHNIQUE: Multiplanar and multiecho pulse sequences of the thoracic spine were obtained without and with intravenous contrast. CONTRAST:  91mL GADAVIST GADOBUTROL 1 MMOL/ML IV SOLN COMPARISON:  None. FINDINGS: Alignment:  Physiologic. Vertebrae: Vertebral body heights are maintained. There is no marrow edema. No suspicious osseous lesion. Cord: Extensive multifocal abnormal cord signal throughout the thoracic cord. There is no abnormal enhancement. Paraspinal and other soft tissues: Unremarkable. Disc levels: Intervertebral disc heights and signal are maintained. There is no disc herniation. IMPRESSION: Extensive multifocal abnormal cord signal throughout the thoracic cord without abnormal enhancement. Electronically Signed   By: Guadlupe Spanish M.D.   On: 03/09/2019 08:33      Subjective: "I think I want to see the doctor he originally recommended."  Discharge Exam: Vitals:   03/12/19 0000 03/12/19 0729   BP: 104/69 120/77  Pulse: 62 69  Resp:  17  Temp: 98.4 F (36.9 C) (!) 97.4 F (36.3 C)  SpO2: 100% 100%   Vitals:   03/10/19 2352 03/11/19 0732 03/12/19 0000 03/12/19 0729  BP: 105/66 112/77 104/69 120/77  Pulse: 85 85 62 69  Resp: 20 19  17   Temp: 98.3 F (36.8 C) 98.2 F (36.8 C) 98.4 F (36.9 C) (!) 97.4 F (36.3 C)  TempSrc:   Oral   SpO2: 99% 100% 100% 100%  Weight:      Height:         General: 29 y.o. female resting in bed in NAD Cardiovascular: RRR, +S1, S2, no m/g/r, equal pulses throughout Respiratory: CTABL, no w/r/r, normal WOB GI: BS+, NDNT, no masses noted, no organomegaly noted MSK: No e/c/c Neuro: A&O x 3, msk str 5/5 BLE Psyc: Appropriate interaction and affect, calm/cooperative   The results of significant diagnostics from this hospitalization (including imaging, microbiology, ancillary and laboratory) are listed below for reference.     Microbiology: Recent Results (from the past 240 hour(s))  SARS CORONAVIRUS 2 (TAT 6-24 HRS) Nasopharyngeal Nasopharyngeal Swab     Status: None   Collection Time: 03/07/19  8:50 PM   Specimen: Nasopharyngeal Swab  Result Value Ref Range Status   SARS Coronavirus 2 NEGATIVE NEGATIVE Final  Comment: (NOTE) SARS-CoV-2 target nucleic acids are NOT DETECTED. The SARS-CoV-2 RNA is generally detectable in upper and lower respiratory specimens during the acute phase of infection. Negative results do not preclude SARS-CoV-2 infection, do not rule out co-infections with other pathogens, and should not be used as the sole basis for treatment or other patient management decisions. Negative results must be combined with clinical observations, patient history, and epidemiological information. The expected result is Negative. Fact Sheet for Patients: HairSlick.no Fact Sheet for Healthcare Providers: quierodirigir.com This test is not yet approved or cleared by the  [place] FDA and  has been authorized for detection and/or diagnosis of SARS-CoV-2 by FDA under an Emergency Use Authorization (EUA). This EUA will remain  in effect (meaning this test can be used) for the duration of the COVID-19 declaration under Section 56 4(b)(1) of the Act, 21 U.S.C. section 360bbb-3(b)(1), unless the authorization is terminated or revoked sooner. Performed at [facility], [address] [place], [place] [address]   CSF culture     Status: None   Collection Time: 03/07/19 11:53 PM   Specimen: CSF; Cerebrospinal Fluid  Result Value Ref Range Status   Specimen Description CSF  Final   Special Requests TUBE 2  Final   Gram Stain   Final    CYTOSPIN SMEAR WBC PRESENT, PREDOMINANTLY MONONUCLEAR NO ORGANISMS SEEN    Culture   Final    NO GROWTH 3 DAYS Performed at [facility], [address], [place], [place] [address]    Report Status 03/11/2019 FINAL  Final     Labs: BNP (last 3 results) No results for input(s): BNP in the last 8760 hours. Basic Metabolic Panel: Recent Labs  Lab 03/07/19 1650 03/08/19 0513 03/10/19 0848 03/12/19 0219  NA 137 137 139 139  K 3.8 3.7 3.7 3.8  CL 103 106 107 106  CO2 24 25 22 24   GLUCOSE 86 78 111* 110*  BUN 11 7 10 13   CREATININE 0.72 0.66 0.65 0.72  CALCIUM 9.4 9.3 9.4 9.3  MG  --   --   --  2.4   Liver Function Tests: Recent Labs  Lab 03/07/19 1650 03/07/19 2318 03/08/19 0513 03/12/19 0219  AST 11*  --  14* 17  ALT 12  --  12 16  ALKPHOS 38  --  40 37*  BILITOT 0.6  --  0.7 0.5  PROT 7.0  --  6.5 6.2*  ALBUMIN 4.1 4.0 3.6 3.3*   No results for input(s): LIPASE, AMYLASE in the last 168 hours. No results for input(s): AMMONIA in the last 168 hours. CBC: Recent Labs  Lab 03/07/19 1650 03/08/19 0513 03/10/19 0848 03/12/19 0219  WBC 5.4 3.9* 17.1* 11.8*  NEUTROABS 3.6  --   --  10.4*  HGB 11.9* 11.7* 11.8* 11.2*  HCT 35.6* 34.6* 33.6* 32.8*  MCV 94.7 93.5 91.1 92.7  PLT 216  206 246 238   Cardiac Enzymes: Recent Labs  Lab 03/08/19 0513  CKTOTAL 35*  CKMB 0.2*   BNP: Invalid input(s): POCBNP CBG: Recent Labs  Lab 03/11/19 0719 03/11/19 1218 03/11/19 1607 03/11/19 2045 03/12/19 0724  GLUCAP 116* 135* 225* 178* 99   D-Dimer No results for input(s): DDIMER in the last 72 hours. Hgb A1c Recent Labs    03/10/19 0848  HGBA1C 5.1   Lipid Profile No results for input(s): CHOL, HDL, LDLCALC, TRIG, CHOLHDL, LDLDIRECT in the last 72 hours. Thyroid function studies No results for input(s):  TSH, T4TOTAL, T3FREE, THYROIDAB in the last 72 hours.  Invalid input(s): FREET3 Anemia work up No results for input(s): VITAMINB12, FOLATE, FERRITIN, TIBC, IRON, RETICCTPCT in the last 72 hours. Urinalysis    Component Value Date/Time   COLORURINE YELLOW 03/08/2019 1001   APPEARANCEUR CLEAR 03/08/2019 1001   LABSPEC 1.025 03/08/2019 1001   PHURINE 6.0 03/08/2019 1001   GLUCOSEU NEGATIVE 03/08/2019 1001   HGBUR NEGATIVE 03/08/2019 1001   BILIRUBINUR NEGATIVE 03/08/2019 1001   KETONESUR NEGATIVE 03/08/2019 1001   PROTEINUR NEGATIVE 03/08/2019 1001   UROBILINOGEN 1.0 12/11/2010 0846   NITRITE NEGATIVE 03/08/2019 1001   LEUKOCYTESUR NEGATIVE 03/08/2019 1001   Sepsis Labs Invalid input(s): PROCALCITONIN,  WBC,  LACTICIDVEN Microbiology Recent Results (from the past 240 hour(s))  SARS CORONAVIRUS 2 (TAT 6-24 HRS) Nasopharyngeal Nasopharyngeal Swab     Status: None   Collection Time: 03/07/19  8:50 PM   Specimen: Nasopharyngeal Swab  Result Value Ref Range Status   SARS Coronavirus 2 NEGATIVE NEGATIVE Final    Comment: (NOTE) SARS-CoV-2 target nucleic acids are NOT DETECTED. The SARS-CoV-2 RNA is generally detectable in upper and lower respiratory specimens during the acute phase of infection. Negative results do not preclude SARS-CoV-2 infection, do not rule out co-infections with other pathogens, and should not be used as the sole basis for treatment  or other patient management decisions. Negative results must be combined with clinical observations, patient history, and epidemiological information. The expected result is Negative. Fact Sheet for Patients: HairSlick.no Fact Sheet for Healthcare Providers: quierodirigir.com This test is not yet approved or cleared by the [place] FDA and  has been authorized for detection and/or diagnosis of SARS-CoV-2 by FDA under an Emergency Use Authorization (EUA). This EUA will remain  in effect (meaning this test can be used) for the duration of the COVID-19 declaration under Section 56 4(b)(1) of the Act, 21 U.S.C. section 360bbb-3(b)(1), unless the authorization is terminated or revoked sooner. Performed at [facility], [address], [place], [place] [address]   CSF culture     Status: None   Collection Time: 03/07/19 11:53 PM   Specimen: CSF; Cerebrospinal Fluid  Result Value Ref Range Status   Specimen Description CSF  Final   Special Requests TUBE 2  Final   Gram Stain   Final    CYTOSPIN SMEAR WBC PRESENT, PREDOMINANTLY MONONUCLEAR NO ORGANISMS SEEN    Culture   Final    NO GROWTH 3 DAYS Performed at [facility], [address], [place], [place] [address]    Report Status 03/11/2019 FINAL  Final     Time coordinating discharge: 35 minutes  SIGNED:   Teddy Spike, DO  [facility] 03/12/2019, 11:38 AM   If 7PM-7AM, please contact night-coverage [email or web address]

## 2019-03-12 NOTE — Progress Notes (Signed)
PT TREATMENT  Late entry note; Seen by Benjiman Core, PTA     Abigail Lewis, PT, DPT Acute Rehabilitation Services Pager [phone] Office [phone]     03/11/19 1245  PT Visit Information  Last PT Received On 03/11/19  Assistance Needed +1  History of Present Illness 29 y.o. female past medical history of anxiety and depression, presenting for evaluation of multiple neurological complaints, which started somewhere around September 2020. Symptom onset began in september 2020 with weakness, progressing to other symptoms including urinary retention/incontinence, bowel incontinence, BPPV, paresthesias. 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 Started IV steroids 2/14 (to have 5 days per MD note)  Subjective Data  Patient Stated Goal To return to independence  Precautions  Precautions Fall  Restrictions  Weight Bearing Restrictions No  Pain Assessment  Pain Assessment No/denies pain  Cognition  Arousal/Alertness Awake/alert  Behavior During Therapy [facility] for tasks assessed/performed  Overall Cognitive Status Within Functional Limits for tasks assessed  Bed Mobility  Overal bed mobility Independent  General bed mobility comments pt in chair  Transfers  Overall transfer level Needs assistance  Equipment used 4-wheeled walker  Transfers Sit to/from Stand  Sit to Stand Supervision  Stand pivot transfers Min guard  General transfer comment supervision for safety. Pt utalized brakes w/o cueing.  Ambulation/Gait  Ambulation/Gait assistance Min guard  Gait Distance (Feet) 125 Feet  Assistive device 4-wheeled walker  Gait Pattern/deviations Step-to pattern;Decreased dorsiflexion - left;Decreased dorsiflexion - right  General Gait Details 1 seated rest brake required after ~101ft as pt had just completed step training and was fatigued. Slow cautious gait.  Gait velocity reduced  Stairs Yes  Stairs assistance Min guard  Stair Management No rails;Step to pattern;Forwards;With walker   Number of Stairs 2  General stair comments Pt was able to negotiate 1 curb like step to simulate sister's home enviroment. Mod cueing provided for technique and min guard for safety.   Balance  Overall balance assessment Needs assistance  Sitting-balance support No upper extremity supported;Feet supported  Sitting balance-Leahy Scale Normal  Standing balance support Single extremity supported;During functional activity  Standing balance-Leahy Scale Poor  General Comments  General comments (skin integrity, edema, etc.) Continued education on energy conservation techniques.  PT - End of Session  Equipment Utilized During Treatment Gait belt  Activity Tolerance Patient tolerated treatment well  Patient left with call bell/phone within reach;in bed  Nurse Communication Mobility status   PT - Assessment/Plan  PT Plan Current plan remains appropriate  PT Visit Diagnosis Muscle weakness (generalized) (M62.81);Unsteadiness on feet (R26.81);Other abnormalities of gait and mobility (R26.89);Other symptoms and signs involving the nervous system (R29.898)  PT Frequency (ACUTE ONLY) Min 4X/week  Follow Up Recommendations Supervision for mobility/OOB;Home health PT (anticipate too fatigued to get to OP and then do PT)  PT equipment None recommended by PT  AM-PAC PT "6 Clicks" Mobility Outcome Measure (Version 2)  Help needed turning from your back to your side while in a flat bed without using bedrails? 4  Help needed moving from lying on your back to sitting on the side of a flat bed without using bedrails? 4  Help needed moving to and from a bed to a chair (including a wheelchair)? 3  Help needed standing up from a chair using your arms (e.g., wheelchair or bedside chair)? 3  Help needed to walk in hospital room? 3  Help needed climbing 3-5 steps with a railing?  3  6 Click Score 20  Consider Recommendation of Discharge To:  Home with no services  PT Goal Progression  Progress towards PT goals  Progressing toward goals  Acute Rehab PT Goals  PT Goal Formulation With patient  Time For Goal Achievement 03/22/19  Potential to Achieve Goals Fair  PT Time Calculation  PT Start Time (ACUTE ONLY) 1136  PT Stop Time (ACUTE ONLY) 1152  PT Time Calculation (min) (ACUTE ONLY) 16 min  PT General Charges  $$ ACUTE PT VISIT 1 Visit  PT Treatments  $Gait Training 8-22 mins

## 2019-03-12 NOTE — Progress Notes (Signed)
Physical Therapy Treatment Patient Details Name: Abigail Lewis MRN: [id number] DOB: 1990/10/10 Today's Date: 03/12/2019    History of Present Illness 29 y.o. female past medical history of anxiety and depression, presenting for evaluation of multiple neurological complaints, which started somewhere around September 2020. Symptom onset began in september 2020 with weakness, progressing to other symptoms including urinary retention/incontinence, bowel incontinence, BPPV, paresthesias. 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 Started IV steroids 2/14 (to have 5 days per MD note)    PT Comments    Patient seen for mobility progression. Current plan remains appropriate.     Follow Up Recommendations  Supervision for mobility/OOB;Home health PT     Equipment Recommendations  None recommended by PT    Recommendations for Other Services       Precautions / Restrictions Precautions Precautions: Fall Restrictions Weight Bearing Restrictions: No    Mobility  Bed Mobility               General bed mobility comments: pt OOB in chair upon arrival  Transfers Overall transfer level: Needs assistance Equipment used: 4-wheeled walker Transfers: Sit to/from Stand Sit to Stand: Supervision;Min guard         General transfer comment: min guard from rollator seat to steady; carry over demonstrated of safe use of AD but still min cueing needed for safety with brakes at times  Ambulation/Gait Ambulation/Gait assistance: Min guard Gait Distance (Feet): (200 ft with seated break) Assistive device: 4-wheeled walker Gait Pattern/deviations: Decreased dorsiflexion - left;Decreased dorsiflexion - right;Step-through pattern;Decreased stride length Gait velocity: reduced   General Gait Details: seated break required due to fatigue and c/o SOB; VSS; decreased cadence and guarded movements   Stairs             Wheelchair Mobility    Modified Rankin (Stroke Patients Only)        Balance Overall balance assessment: Needs assistance Sitting-balance support: No upper extremity supported;Feet supported Sitting balance-Leahy Scale: Normal     Standing balance support: Single extremity supported;During functional activity Standing balance-Leahy Scale: Poor                              Cognition Arousal/Alertness: Awake/alert Behavior During Therapy: WFL for tasks assessed/performed Overall Cognitive Status: Within Functional Limits for tasks assessed                                        Exercises      General Comments        Pertinent Vitals/Pain Pain Assessment: No/denies pain    Home Living                      Prior Function            PT Goals (current goals can now be found in the care plan section) Progress towards PT goals: Progressing toward goals    Frequency    Min 4X/week      PT Plan Current plan remains appropriate    Co-evaluation              AM-PAC PT "6 Clicks" Mobility   Outcome Measure  Help needed turning from your back to your side while in a flat bed without using bedrails?: None Help needed moving from lying on your back to sitting on the side of a  flat bed without using bedrails?: None Help needed moving to and from a bed to a chair (including a wheelchair)?: A Little Help needed standing up from a chair using your arms (e.g., wheelchair or bedside chair)?: A Little Help needed to walk in hospital room?: A Little Help needed climbing 3-5 steps with a railing? : A Little 6 Click Score: 20    End of Session Equipment Utilized During Treatment: Gait belt Activity Tolerance: Patient tolerated treatment well Patient left: with call bell/phone within reach;in chair Nurse Communication: Mobility status PT Visit Diagnosis: Muscle weakness (generalized) (M62.81);Unsteadiness on feet (R26.81);Other abnormalities of gait and mobility (R26.89);Other symptoms and signs  involving the nervous system (R29.898)     Time: 1423-1440 PT Time Calculation (min) (ACUTE ONLY): 17 min  Charges:  $Gait Training: 8-22 mins                     Earney Navy, PTA Acute Rehabilitation Services Pager: [phone] Office: [phone]     Darliss Cheney 03/12/2019, 4:54 PM

## 2019-03-13 LAB — NEUROMYELITIS OPTICA AUTOAB, IGG: NMO-IgG: <1.5 U/mL (ref 0.0–3.0)

## 2019-03-20 ENCOUNTER — Telehealth: Payer: 59 | Admitting: Neurology

## 2019-03-23 ENCOUNTER — Encounter: Payer: Self-pay | Admitting: Neurology

## 2019-03-23 ENCOUNTER — Other Ambulatory Visit: Payer: Self-pay

## 2019-03-23 ENCOUNTER — Ambulatory Visit: Payer: 59 | Admitting: Neurology

## 2019-03-23 VITALS — BP 105/70 | HR 85 | Temp 97.2°F | Ht 63.0 in | Wt 142.6 lb

## 2019-03-23 DIAGNOSIS — G35 Multiple sclerosis: Secondary | ICD-10-CM | POA: Diagnosis not present

## 2019-03-23 DIAGNOSIS — Z79899 Other long term (current) drug therapy: Secondary | ICD-10-CM | POA: Diagnosis not present

## 2019-03-23 DIAGNOSIS — G379 Demyelinating disease of central nervous system, unspecified: Secondary | ICD-10-CM | POA: Diagnosis not present

## 2019-03-23 DIAGNOSIS — R269 Unspecified abnormalities of gait and mobility: Secondary | ICD-10-CM

## 2019-03-23 DIAGNOSIS — R270 Ataxia, unspecified: Secondary | ICD-10-CM

## 2019-03-23 DIAGNOSIS — R39198 Other difficulties with micturition: Secondary | ICD-10-CM | POA: Insufficient documentation

## 2019-03-23 DIAGNOSIS — G35D Multiple sclerosis, unspecified: Secondary | ICD-10-CM

## 2019-03-23 NOTE — Progress Notes (Addendum)
[facility]  PATIENT: Abigail Lewis DOB: 05/13/1990  REFERRING DOCTOR OR PCP: Cherylann Ratel (hospitalist) ; [facility] (PCP) SOURCE: Notes from recent emergency room and hospital stay and neurology, imaging and lab reports, MRI images personally reviewed.  _________________________________   HISTORICAL  CHIEF COMPLAINT:  Chief Complaint  Patient presents with  . New Patient (Initial Visit)    RM 12, alone. Internal referral from Etta Quill, [place] at [facility] for demylinating disease. Had MRI brain/cervical/thoracic. Can all be viewed in EPIC.  Was at [facility] 03/07/19-03/12/19. She received 5 days of IV steroids. Denies any numb/tingling. Wears glasses. Vision improved since getting IV steroids.   . Gait Problem    Using walker for the last three months. Very unsteady gait. Has had near falls.    HISTORY OF PRESENT ILLNESS:  I had the pleasure seeing your patient, Abigail Lewis, at the [place] center at [facility] for neurologic consultation regarding her relapsing remitting MS.  She is a 29 year old woman who was hospitalized between 03/07/2019 and 03/11/2019 after presenting with weakness and abnormal MRIs.  In 2018 she noted that her legs would get numb and achy.   Symptoms fluctuated but persisted through 2019 and 2020.  She saw Dr. Posey Pronto at [facility] neurology in December 2019 and again for an EMG October 2020 which was normal.  In August 2020 she began to experience lightheadedness with dizziness but not vertigo.  In October, she was noting more issues with her legs experiencing weakness and clumsiness.    She had no falls but started to use a walker due to poor gait.    Symptoms progressed over the rest of 2020.  In January, her left hand started to have a tremor.    In early February 2021, she was unable to walk and needed to crawl down the stairs.   Her voice was slurred.  She had mild right visual changes.   She went to the [facility] ED and MRI's of the  brain and cervical spine were concerning for a demyelinating disorder.     While in the hospital, she had a lumbar puncture showing 5 oligoclonal bands and elevated IgG index .   Labwork showed positive ANA with elevated chromatin Ab and negative SSA/SSB initially and then milldy increased SSA 2 days later, negative SPEP/IEF, negative anti-NMO.   She is HIV negative.  She has had dry eyes and dry lips but not dry mouth since November.    She had no difficulty with nausea or vomiting.   She has noted urinary hesitancy.    She was admitted and had 5 days of IV steroids.  Since the steroids, she has noted that the lightheadedness, bladder function and gait improved but not near baseline.    Her left side is weaker than her right side.   She is still using a walker and can go > 200 meters without a break.  She feels she can not walk more than few steps without the walker.   She notes numbness in her fingertips with tingling bilaterally.    She has urinary hesitancy and occasional incontinence.     She feels more emotional with some depression since symptoms worsened.    She denies cognitive changes.     She has an abscess in her left mouth and will be seeing dentistry in a week or two.      I personally reviewed the MRIs of the brain, cervical spine and thoracic spine performed  between February 13 and March 09, 2019.  The MRI of the brain shows multiple T2/FLAIR hyperintense foci in the cerebellum, right middle cerebellar peduncle, brainstem including periaqueductal gray and hemispheres.  Some of the foci enhanced after contrast.  Additionally, there appeared to be enhancement within the left optic nerve MRI of the cervical spine showed a longitudinal lesion from C1-C2 throughC4 and patchy lesions elsewhere in the lower cervical and upper thoracic spinal cord.  MRI of the thoracic spine showed patchy lesions throughout the spinal cord.  There was no abnormal enhancement in the spinal cord.  She was  admitted to [facility] to have 5 days of IV Solu-Medrol.  REVIEW OF SYSTEMS: Constitutional: No fevers, chills, sweats, or change in appetite Eyes: No visual changes, double vision, eye pain Ear, nose and throat: No hearing loss, ear pain, nasal congestion, sore throat Cardiovascular: No chest pain, palpitations Respiratory: No shortness of breath at rest or with exertion.   No wheezes GastrointestinaI: No nausea, vomiting, diarrhea, abdominal pain, fecal incontinence Genitourinary: No dysuria, urinary retention or frequency.  No nocturia. Musculoskeletal: No neck pain, back pain Integumentary: No rash, pruritus, skin lesions Neurological: as above Psychiatric: She has had anxiety and depression. Endocrine: No palpitations, diaphoresis, change in appetite, change in weigh or increased thirst Hematologic/Lymphatic: No anemia, purpura, petechiae. Allergic/Immunologic: No itchy/runny eyes, nasal congestion, recent allergic reactions, rashes  ALLERGIES: Allergies  Allergen Reactions  . Penicillins Swelling    HOME MEDICATIONS:  Current Outpatient Medications:  .  acetaminophen (TYLENOL) 500 MG tablet, Take 2 tablets (1,000 mg total) by mouth every 8 (eight) hours as needed for moderate pain., Disp: 30 tablet, Rfl: 0 .  polyethylene glycol (MIRALAX / GLYCOLAX) 17 g packet, Take 17 g by mouth daily as needed for up to 14 days for moderate constipation., Disp: 14 each, Rfl: 0 .  pravastatin (PRAVACHOL) 20 MG tablet, Take 20 mg by mouth daily., Disp: , Rfl:  .  sertraline (ZOLOFT) 50 MG tablet, Take 1 tablet (50 mg total) by mouth daily., Disp: 30 tablet, Rfl: 0 .  traZODone (DESYREL) 50 MG tablet, TAKE 1 TABLET BY MOUTH AT BEDTIME AS NEEDED FOR SLEEP (Patient taking differently: Take 50 mg by mouth at bedtime as needed for sleep. ), Disp: 30 tablet, Rfl: 0  PAST MEDICAL HISTORY: Past Medical History:  Diagnosis Date  . ANA positive 02/13/2019  . Anxiety   . Depression   .  Hyperlipidemia 12/03/2018   LDL=155    PAST SURGICAL HISTORY: Past Surgical History:  Procedure Laterality Date  . NO PAST SURGERIES    . TOOTH EXTRACTION      FAMILY HISTORY: Family History  Problem Relation Age of Onset  . Stroke Mother   . Diabetes Father   . Healthy Sister   . Autism Brother     SOCIAL HISTORY:  Social History   Socioeconomic History  . Marital status: Single    Spouse name: Not on file  . Number of children: 0  . Years of education: 24  . Highest education level: Not on file  Occupational History  . Occupation: [employer], post office  Tobacco Use  . Smoking status: Never Smoker  . Smokeless tobacco: Never Used  Substance and Sexual Activity  . Alcohol use: Yes    Comment: wine sometimes   . Drug use: No  . Sexual activity: Yes    Birth control/protection: Pill  Other Topics Concern  . Not on file  Social History Narrative  Right handed    Caffeine use: sometimes   She works as a Office manager and part-time as Lawyer.   She lives alone.  No children.    Social Determinants of Health   Financial Resource Strain:   . Difficulty of Paying Living Expenses: Not on file  Food Insecurity:   . Worried About Programme researcher, broadcasting/film/video in the Last Year: Not on file  . Ran Out of Food in the Last Year: Not on file  Transportation Needs:   . Lack of Transportation (Medical): Not on file  . Lack of Transportation (Non-Medical): Not on file  Physical Activity:   . Days of Exercise per Week: Not on file  . Minutes of Exercise per Session: Not on file  Stress:   . Feeling of Stress : Not on file  Social Connections:   . Frequency of Communication with Friends and Family: Not on file  . Frequency of Social Gatherings with Friends and Family: Not on file  . Attends Religious Services: Not on file  . Active Member of Clubs or Organizations: Not on file  . Attends Banker Meetings: Not on file  . Marital Status: Not on file  Intimate Partner  Violence:   . Fear of Current or Ex-Partner: Not on file  . Emotionally Abused: Not on file  . Physically Abused: Not on file  . Sexually Abused: Not on file     PHYSICAL EXAM  Vitals:   03/23/19 0844  BP: 105/70  Pulse: 85  Temp: (!) 97.2 F (36.2 C)  Weight: 142 lb 9.6 oz (64.7 kg)  Height: 5\' 3"  (1.6 m)    Body mass index is 25.26 kg/m.   Hearing Screening   125Hz  250Hz  500Hz  1000Hz  2000Hz  3000Hz  4000Hz  6000Hz  8000Hz   Right ear:           Left ear:             Visual Acuity Screening   Right eye Left eye Both eyes  Without correction:     With correction: 20/70 20/100 20/100     General: The patient is well-developed and well-nourished and in no acute distress  HEENT:  Head is [place]/AT.  Sclera are anicteric.  Funduscopic exam shows normal optic discs and retinal vessels.  Neck: No carotid bruits are noted.  The neck is nontender.  Cardiovascular: The heart has a regular rate and rhythm with a normal S1 and S2. There were no murmurs, gallops or rubs.    Skin: Extremities are without rash or  edema.  Musculoskeletal:  Back is nontender  Neurologic Exam  Mental status: The patient is alert and oriented x 3 at the time of the examination. The patient has apparent normal recent and remote memory, with an apparently normal attention span and concentration ability.   Speech is normal.  Cranial nerves: Extraocular movements are full. Pupils are equal, round, and reactive to light and accomodation.  Visual fields are full.  Facial symmetry is present. There is good facial sensation to soft touch bilaterally.Facial strength is normal.  Trapezius and sternocleidomastoid strength is normal. No dysarthria is noted.  The tongue is midline, and the patient has symmetric elevation of the soft palate. No obvious hearing deficits are noted.  Motor:  Muscle bulk is normal.   Tone is normal. Strength is  5 / 5 in all 4 extremities.   Sensory: She had intact sensation to touch,  temperature and vibration.  Coordination: Cerebellar testing reveals mildly reduced  right and moderately reduced left finger-nose-finger.  Heel-to-shin was also reduced worse on the left.  Gait and station: Station is normal.   Gait is ataxic and she leans towards the left.  She cannot do a tandem gait.. Romberg is borderline.   Reflexes: Deep tendon reflexes are symmetric and normal in arms and increased at the legs.  There is no ankle clonus..   Plantar responses are flexor.      DIAGNOSTIC DATA (LABS, IMAGING, TESTING) - I reviewed patient records, labs, notes, testing and imaging myself where available.  Lab Results  Component Value Date   WBC 11.8 (H) 03/12/2019   HGB 11.2 (L) 03/12/2019   HCT 32.8 (L) 03/12/2019   MCV 92.7 03/12/2019   PLT 238 03/12/2019      Component Value Date/Time   NA 139 03/12/2019 0219   K 3.8 03/12/2019 0219   CL 106 03/12/2019 0219   CO2 24 03/12/2019 0219   GLUCOSE 110 (H) 03/12/2019 0219   BUN 13 03/12/2019 0219   CREATININE 0.72 03/12/2019 0219   CALCIUM 9.3 03/12/2019 0219   PROT 6.2 (L) 03/12/2019 0219   ALBUMIN 3.3 (L) 03/12/2019 0219   ALBUMIN 4.0 03/07/2019 2318   AST 17 03/12/2019 0219   ALT 16 03/12/2019 0219   ALKPHOS 37 (L) 03/12/2019 0219   BILITOT 0.5 03/12/2019 0219   GFRNONAA >60 03/12/2019 0219   GFRAA >60 03/12/2019 0219   Lab Results  Component Value Date   CHOL 207 (H) 07/12/2016   HDL 61 07/12/2016   LDLCALC 137 (H) 07/12/2016   TRIG 43 07/12/2016   CHOLHDL 3.4 07/12/2016   Lab Results  Component Value Date   HGBA1C 5.1 03/10/2019   Lab Results  Component Value Date   VITAMINB12 894 03/08/2019   Lab Results  Component Value Date   TSH 2.212 03/08/2019       ASSESSMENT AND PLAN  Demyelinating disease of central nervous system (HCC)  Multiple sclerosis (HCC) - Plan: Hepatitis B core antibody, total, Hepatitis B surface antigen, Hepatitis B surface antibody,qualitative, QuantiFERON-TB Gold  Plus  High risk medication use - Plan: Hepatitis B core antibody, total, Hepatitis B surface antigen, Hepatitis B surface antibody,qualitative, QuantiFERON-TB Gold Plus  Gait disturbance  Ataxia  Urinary dysfunction   In summary, Abigail Lewis is a 29 year old woman who has had progressive neurologic symptoms over the past few months.  A recent hospitalization showed multiple foci within the brain and the spinal cord consistent with demyelination.  Differentiating between the possibilities of multiple sclerosis and neuromyelitis optica is difficult.  Favoring MS, she has oligoclonal bands (almost all MS patients but only one quarter of neuromyelitis optica patients will have) and extensive lesions in the brain some enhancing.   Additionally, the anti-NMO antibody was negative.  Anti-MOG was not performed.  Favoring neuromyelitis optica, she has extensive spinal cord lesions including some longitudinally extensive lesions.  To help further in differentiating between these 2, I will check the [facility] anti-NMO and anti-MOG FACS test.  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  There are still a small percent of NMOSD patients who are negative but I would favor MS if this test is negative.  Anti-CD20 treatments could be affected with either disorder and I am leaning towards starting her on 1 of these such as ocrelizumab or rituximab.  I will check blood work to rule out chronic infections.  She will return to see me in 2 to  3 months or sooner if she has new or worsening neurologic symptoms but we will hopefully be able to begin therapy in a couple weeks.  Thank you for asking me to see Abigail Lewis.  Please let me know if I can be of further assistance with her or other patients in the future. Garielle Mroz A. Epimenio Foot, MD, [facility] 03/23/2019, 1:12 PM Certified in Neurology, Clinical Neurophysiology, Sleep Medicine and Neuroimaging  [facility] [address], [address] [place], [place] [address] [phone]

## 2019-03-24 ENCOUNTER — Telehealth: Payer: Self-pay | Admitting: Neurology

## 2019-03-24 LAB — SPECIMEN STATUS REPORT

## 2019-03-24 NOTE — Telephone Encounter (Signed)
The labs so far look fine but there are still a couple of labs pending.  We can send in her Ocre[employer] form but it would take a couple weeks for insurance preauthorization

## 2019-03-24 NOTE — Telephone Encounter (Signed)
Patient called to get an update on her labs and if anything has been said about the treatment she was planning to get done in terms of insurance coverage. Please FU

## 2019-03-25 ENCOUNTER — Telehealth: Payer: Self-pay | Admitting: *Deleted

## 2019-03-25 LAB — HEPATITIS B SURFACE ANTIGEN: Hepatitis B Surface Ag: NEGATIVE

## 2019-03-25 LAB — QUANTIFERON-TB GOLD PLUS
QuantiFERON Mitogen Value: >10 IU/mL
QuantiFERON Nil Value: 0.02 IU/mL
QuantiFERON TB1 Ag Value: 0.05 IU/mL
QuantiFERON TB2 Ag Value: 0.03 IU/mL
QuantiFERON-TB Gold Plus: NEGATIVE

## 2019-03-25 LAB — ANTI-MYELIN ASSOC GLYCOP IGG: Anti-Myelin Assoc Glycop IgG: <1:10 {titer}

## 2019-03-25 LAB — SPECIMEN STATUS REPORT

## 2019-03-25 LAB — PAN-ANCA
ANCA Proteinase 3: <3.5 U/mL (ref 0.0–3.5)
Atypical pANCA: <1:20 {titer}
C-ANCA: <1:20 {titer}
Myeloperoxidase Ab: <9 U/mL (ref 0.0–9.0)
P-ANCA: <1:20 {titer}

## 2019-03-25 LAB — HEPATITIS B CORE ANTIBODY, TOTAL: Hep B Core Total Ab: NEGATIVE

## 2019-03-25 LAB — HEPATITIS B SURFACE ANTIBODY,QUALITATIVE: Hep B Surface Ab, Qual: REACTIVE

## 2019-03-25 NOTE — Telephone Encounter (Signed)
Faxed completed/signed [employer] start form to [employer] at [phone]. Received fax confirmation. Received fax back stating they received start form. Gave start form, order, OV notes/labs/MRI reports to [facility] to start processing for insurance approval/scheduling.

## 2019-03-25 NOTE — Telephone Encounter (Signed)
I called pt. Relayed Dr. Sater's message. She verbalized understanding and appreciation. 

## 2019-03-26 ENCOUNTER — Telehealth: Payer: Self-pay | Admitting: *Deleted

## 2019-03-26 NOTE — Telephone Encounter (Signed)
Called pt to let her know we got [facility] lab ordered: [facility]. She will come by today to pick up lab order and pamphlet on billing/payment services. I placed up front for pick up. She is aware they should be reaching out to discuss cost prior to her getting lab done. Lab location: [facility] now located at [address], [place], [place] [address] (directly behind [employer]). Hours: Monday-Friday 8-5pm, Saturday 9-12pm. No appt necessary. Phone: [phone].

## 2019-03-27 NOTE — Telephone Encounter (Signed)
Pt called back and stated that the insurance gave her the numbers to in network labs.  [facility]-[phone] or [phone]  [facility] -[phone]  [facility] [phone]

## 2019-03-27 NOTE — Telephone Encounter (Signed)
Pt called stating she is needing to discuss the order for the labs with RN. Please advise.

## 2019-03-30 NOTE — Telephone Encounter (Signed)
I will call pt back to discuss. This is a specialty lab, pt has to go to a [facility] which is the [facility] now listed on lab order provided to pt.

## 2019-03-30 NOTE — Telephone Encounter (Signed)
I called pt back. Relayed below info. She has lab draw scheduled for tomorrow at Any lab test now. She will call financial assistance line today to figure out what cost of lab test my be prior to going. Nothing further needed.

## 2019-04-01 ENCOUNTER — Telehealth: Payer: Self-pay | Admitting: Neurology

## 2019-04-01 NOTE — Telephone Encounter (Signed)
Sent message to Lucama, RN w/ intrafusion to contact pt with update.

## 2019-04-01 NOTE — Telephone Encounter (Signed)
Pt called wanting to know when will her treatment begin. Please advise.

## 2019-04-08 NOTE — Telephone Encounter (Signed)
Called and spoke with Alyssa with [facility] call center at [phone]. They verified they still have patient's lab specimen and will place request to also run MOGFS. Nothing further needed.   NMO/AQP4 FACS, S negative. Waiting on results of MOGFS.

## 2019-04-13 NOTE — Telephone Encounter (Signed)
MOG FACS, S: negative. Gave to MD to review.

## 2019-04-16 ENCOUNTER — Telehealth: Payer: Self-pay | Admitting: Neurology

## 2019-04-16 ENCOUNTER — Telehealth: Payer: Self-pay | Admitting: *Deleted

## 2019-04-16 NOTE — Telephone Encounter (Addendum)
Called pt. Scheduled f/u for 05/25/19 at 1:30pm with Dr. Epimenio Foot.  Advised [facility] labs negative (NMO/AQP4 FACS, S and MOG FACS, S)  Advised we are completing FMLA form for her. She needs coverage for time she was in the hospital 03/07/19-03/11/19. Advised Dr. Epimenio Foot will provide 1 time per month, 2 days each for possible flare ups. She also asked for her work day to be reduced from 8 hr to 6 hr. I placed on hold and spoke with MD. He approved this for 2 months. Relayed to pt. She verbalized understanding. Gave completed/signed form back to medical records to process for pt.  She also is aware her lab test for Sjogren's on 03/11/19 came back positive. She f/u with Rheumatologist 05/13/19 on this. She is wondering if she should be concerned about this/related to MS. Advised I will send message to MD to review.

## 2019-04-16 NOTE — Telephone Encounter (Signed)
I spoke to Abigail Lewis.  One of the 2 Sjogren's antibodies was mildly elevated (1.1 with normal being less than 0.9).  She did not have symptoms of Sjogren's disease.  The neuromyelitis optica antibodies (anti-NMO and anti-MOG) were both negative.  She received her first dose of ocrelizumab and did well.  She is scheduled for the second half of the dose next week.  She feels her walking is doing much better.  She continues to have vertigo.  I took another quick look at the MRI and she did have an enhancing lesion in the right middle cerebellar peduncle and other lesions in the pons and cerebellum.

## 2019-04-20 ENCOUNTER — Telehealth: Payer: Self-pay | Admitting: *Deleted

## 2019-04-20 NOTE — Telephone Encounter (Signed)
I faxed pt fmla on 04/16/19

## 2019-04-27 ENCOUNTER — Telehealth: Payer: Self-pay | Admitting: Neurology

## 2019-04-27 ENCOUNTER — Encounter: Payer: Self-pay | Admitting: Neurology

## 2019-04-27 NOTE — Telephone Encounter (Signed)
Patient called to advise she is needing a letter to return back to work for her [employer].  Fax#[phone] - sharon go

## 2019-04-27 NOTE — Progress Notes (Signed)
       [facility] [address] [place], [place]  [address] Phone:  [phone]   Fax:  [phone]   April 27, 2019   Re:  Abigail Lewis  [birth year]    To whom it may concern:  [name] is a patient at the MS center at [facility] where she is followed for multiple sclerosis.  She is now able to return to work..   Sincerely,       Caide Campi A. Epimenio Foot, MD, PhD, FAAN Certified in Neurology, Clinical Neurophysiology, Sleep Medicine, Pain Medicine and Neuroimaging Director, Multiple Sclerosis Center at [facility]   [facility] [address], [address] [place], [place] [address] [phone]

## 2019-04-28 NOTE — Telephone Encounter (Signed)
Called pt. She would like letter mailed. I placed in mail for her.  She also asked for FMLA previously filled out to be changed so she can work her 8 hr day. She previously asked for restriction of working 6 hr days instead of 8hr. She spoke with her manager and they need updated note reflecting this. Addendum made and given to Stanton Kidney S to send in for pt  She reported intermittent lip numbness that is daily, started last week. I relayed to Dr. Epimenio Foot and he would not change anything. Wants her to continue to monitor symptoms. I called pt and relayed this. She will call back if she has any new or worsening symptoms.

## 2019-05-08 ENCOUNTER — Telehealth: Payer: 59 | Admitting: Neurology

## 2019-05-25 ENCOUNTER — Ambulatory Visit: Payer: 59 | Admitting: Neurology

## 2019-05-25 ENCOUNTER — Encounter: Payer: Self-pay | Admitting: Neurology

## 2019-05-25 ENCOUNTER — Other Ambulatory Visit: Payer: Self-pay

## 2019-05-25 VITALS — BP 97/67 | HR 66 | Temp 97.0°F | Ht 63.0 in | Wt 155.0 lb

## 2019-05-25 DIAGNOSIS — Z79899 Other long term (current) drug therapy: Secondary | ICD-10-CM

## 2019-05-25 DIAGNOSIS — R270 Ataxia, unspecified: Secondary | ICD-10-CM

## 2019-05-25 DIAGNOSIS — G35 Multiple sclerosis: Secondary | ICD-10-CM

## 2019-05-25 DIAGNOSIS — R269 Unspecified abnormalities of gait and mobility: Secondary | ICD-10-CM | POA: Diagnosis not present

## 2019-05-25 DIAGNOSIS — G35A Relapsing-remitting multiple sclerosis: Secondary | ICD-10-CM | POA: Insufficient documentation

## 2019-05-25 NOTE — Progress Notes (Signed)
[facility]  PATIENT: Abigail Lewis DOB: [birth year]  REFERRING DOCTOR OR PCP: Margie Ege (hospitalist) ; [facility] (PCP) SOURCE: Notes from recent emergency room and hospital stay and neurology, imaging and lab reports, MRI images personally reviewed.  _________________________________   HISTORICAL  CHIEF COMPLAINT:  Chief Complaint  Patient presents with  . Follow-up    RM 13, alone. Last seen 03/23/2019. No falls since last seen.  . Multiple Sclerosis    On Ocrevus. Last infusion: 04/23/2019. Next infusion: 10/22/2019. She is tolerating well. She is walking a little better. She is still having trouble with left hand tremor. It is about the same, has not improved.     HISTORY OF PRESENT ILLNESS:  Abigail Lewis is a 29 yo woman with relapsing remitting MS.  Update 05/25/2019: She had an unusual presentation with a longitudinal lesion on cervical spine MRI.  However, anti-NMO and anti-MOG were both negative.  Additionally she had oligoclonal bands in the CSF.  Therefore, MS is more likely.  However, as NMOSD is not completely ruled out we decided to initiate Ocrevus therapy as anti-CD20 therapy has benefit in both MS and NMO.  She started Ocrevus and has tolerated it well.      Her gait is doing better since the last visit.  Both balance and strength have improved.  She is still feeling some lightheaded.   She notes a mild left hand tremor and left hand clumsiness.  This has improved compared to the last visit.  She notes no current difficulties with her eye.  The slurred speech has resolved.  FROM 03/23/2019: She is a 29 year old woman who was hospitalized between 03/07/2019 and 03/11/2019 after presenting with weakness and abnormal MRIs.  In 2018 she noted that her legs would get numb and achy.   Symptoms fluctuated but persisted through 2019 and 2020.  She saw Dr. Allena Katz at [facility] neurology in December 2019 and again for an EMG October 2020 which was  normal.  In August 2020 she began to experience lightheadedness with dizziness but not vertigo.  In October, she was noting more issues with her legs experiencing weakness and clumsiness.    She had no falls but started to use a walker due to poor gait.    Symptoms progressed over the rest of 2020.  In January, her left hand started to have a tremor.    In early February 2021, she was unable to walk and needed to crawl down the stairs.   Her voice was slurred.  She had mild right visual changes.   She went to the [facility] ED and MRI's of the brain and cervical spine were concerning for a demyelinating disorder.     While in the hospital, she had a lumbar puncture showing 5 oligoclonal bands and elevated IgG index .   Labwork showed positive ANA with elevated chromatin Ab and negative SSA/SSB initially and then milldy increased SSA 2 days later, negative SPEP/IEF, negative anti-NMO.   She is HIV negative.  She has had dry eyes and dry lips but not dry mouth since November.    She had no difficulty with nausea or vomiting.   She has noted urinary hesitancy.    She was admitted and had 5 days of IV steroids.  Since the steroids, she has noted that the lightheadedness, bladder function and gait improved but not near baseline.    Her left side is weaker than her right side.   She is still  using a walker and can go > 200 meters without a break.  She feels she can not walk more than few steps without the walker.   She notes numbness in her fingertips with tingling bilaterally.    She has urinary hesitancy and occasional incontinence.     She feels more emotional with some depression since symptoms worsened.    She denies cognitive changes.     She has an abscess in her left mouth and will be seeing dentistry in a week or two.      I personally reviewed the MRIs of the brain, cervical spine and thoracic spine performed between February 13 and March 09, 2019.  The MRI of the brain shows multiple T2/FLAIR  hyperintense foci in the cerebellum, right middle cerebellar peduncle, brainstem including periaqueductal gray and hemispheres.  Some of the foci enhanced after contrast.  Additionally, there appeared to be enhancement within the left optic nerve MRI of the cervical spine showed a longitudinal lesion from C1-C2 throughC4 and patchy lesions elsewhere in the lower cervical and upper thoracic spinal cord.  MRI of the thoracic spine showed patchy lesions throughout the spinal cord.  There was no abnormal enhancement in the spinal cord.  She was admitted to [facility] to have 5 days of IV Solu-Medrol.  REVIEW OF SYSTEMS: Constitutional: No fevers, chills, sweats, or change in appetite Eyes: No visual changes, double vision, eye pain Ear, nose and throat: No hearing loss, ear pain, nasal congestion, sore throat Cardiovascular: No chest pain, palpitations Respiratory: No shortness of breath at rest or with exertion.   No wheezes GastrointestinaI: No nausea, vomiting, diarrhea, abdominal pain, fecal incontinence Genitourinary: No dysuria, urinary retention or frequency.  No nocturia. Musculoskeletal: No neck pain, back pain Integumentary: No rash, pruritus, skin lesions Neurological: as above Psychiatric: She has had anxiety and depression. Endocrine: No palpitations, diaphoresis, change in appetite, change in weigh or increased thirst Hematologic/Lymphatic: No anemia, purpura, petechiae. Allergic/Immunologic: No itchy/runny eyes, nasal congestion, recent allergic reactions, rashes  ALLERGIES: Allergies  Allergen Reactions  . Penicillins Swelling    HOME MEDICATIONS:  Current Outpatient Medications:  .  acetaminophen (TYLENOL) 500 MG tablet, Take 2 tablets (1,000 mg total) by mouth every 8 (eight) hours as needed for moderate pain., Disp: 30 tablet, Rfl: 0 .  ocrelizumab (OCREVUS) 300 MG/10ML injection, Inject into the vein once., Disp: , Rfl:  .  pravastatin (PRAVACHOL) 20 MG tablet, Take  20 mg by mouth daily., Disp: , Rfl:  .  traZODone (DESYREL) 50 MG tablet, TAKE 1 TABLET BY MOUTH AT BEDTIME AS NEEDED FOR SLEEP (Patient taking differently: Take 50 mg by mouth at bedtime as needed for sleep. ), Disp: 30 tablet, Rfl: 0 .  sertraline (ZOLOFT) 50 MG tablet, Take 1 tablet (50 mg total) by mouth daily., Disp: 30 tablet, Rfl: 0  PAST MEDICAL HISTORY: Past Medical History:  Diagnosis Date  . ANA positive 02/13/2019  . Anxiety   . Depression   . Hyperlipidemia 12/03/2018   LDL=155    PAST SURGICAL HISTORY: Past Surgical History:  Procedure Laterality Date  . NO PAST SURGERIES    . TOOTH EXTRACTION      FAMILY HISTORY: Family History  Problem Relation Age of Onset  . Stroke Mother   . Diabetes Father   . Healthy Sister   . Autism Brother     SOCIAL HISTORY:  Social History   Socioeconomic History  . Marital status: Single    Spouse name: Not on  file  . Number of children: 0  . Years of education: 55  . Highest education level: Not on file  Occupational History  . Occupation: [employer], post office  Tobacco Use  . Smoking status: Never Smoker  . Smokeless tobacco: Never Used  Substance and Sexual Activity  . Alcohol use: Yes    Comment: wine sometimes   . Drug use: No  . Sexual activity: Yes    Birth control/protection: Pill  Other Topics Concern  . 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   She lives alone.  No children.    Social Determinants of Health   Financial Resource Strain:   . Difficulty of Paying Living Expenses:   Food Insecurity:   . Worried About Charity fundraiser in the Last Year:   . Arboriculturist in the Last Year:   Transportation Needs:   . Film/video editor (Medical):   [name] Kitchen Lack of Transportation (Non-Medical):   Physical Activity:   . Days of Exercise per Week:   . Minutes of Exercise per Session:   Stress:   . Feeling  of Stress :   Social Connections:   . Frequency of Communication with Friends and Family:   . Frequency of Social Gatherings with Friends and Family:   . Attends Religious Services:   . Active Member of Clubs or Organizations:   . Attends Archivist Meetings:   [name] Kitchen Marital Status:   Intimate Partner Violence:   . Fear of Current or Ex-Partner:   . Emotionally Abused:   [name] Kitchen Physically Abused:   . Sexually Abused:      PHYSICAL EXAM  Vitals:   05/25/19 1322  BP: 97/67  Pulse: 66  Temp: (!) 97 F (36.1 C)  Weight: 155 lb (70.3 kg)  Height: 5\' 3"  (1.6 m)    Body mass index is 27.46 kg/m.  No exam data present   General: The patient is well-developed and well-nourished and in no acute distress  HEENT:  Head is [place]/AT.  Sclera are anicteric.     Skin: Extremities are without rash or  edema.   Neurologic Exam  Mental status: The patient is alert and oriented x 3 at the time of the examination. The patient has apparent normal recent and remote memory, with an apparently normal attention span and concentration ability.   Speech is normal.  Cranial nerves: Extraocular movements are full. Pupils are equal, round, and reactive to light and accomodation.  Visual fields are full. Color vision is symmetric.  Facial strength is normal.  Trapezius and sternocleidomastoid strength is normal. No dysarthria is noted.    No obvious hearing deficits are noted.  Motor:  Muscle bulk is normal.   Tone is normal. Strength is  5 / 5 in all 4 extremities.   Sensory: She had intact sensation to touch, temperature and vibration.  Coordination: Cerebellar testing reveals mildly left finger-nose-finger.  Heel-to-shin is slightly reduced on the left.  Much better  Than last visit  Gait and station: Station is normal.   Gait is minimally wide and tandem moderately wide.  . Romberg is negative now.   Reflexes: Deep tendon reflexes are symmetric and normal in arms and increased at the legs.   There is no ankle clonus.[name] Kitchen          DIAGNOSTIC DATA (LABS, IMAGING,  TESTING) - I reviewed patient records, labs, notes, testing and imaging myself where available.  Lab Results  Component Value Date   WBC 11.8 (H) 03/12/2019   HGB 11.2 (L) 03/12/2019   HCT 32.8 (L) 03/12/2019   MCV 92.7 03/12/2019   PLT 238 03/12/2019      Component Value Date/Time   NA 139 03/12/2019 0219   K 3.8 03/12/2019 0219   CL 106 03/12/2019 0219   CO2 24 03/12/2019 0219   GLUCOSE 110 (H) 03/12/2019 0219   BUN 13 03/12/2019 0219   CREATININE 0.72 03/12/2019 0219   CALCIUM 9.3 03/12/2019 0219   PROT 6.2 (L) 03/12/2019 0219   ALBUMIN 3.3 (L) 03/12/2019 0219   ALBUMIN 4.0 03/07/2019 2318   AST 17 03/12/2019 0219   ALT 16 03/12/2019 0219   ALKPHOS 37 (L) 03/12/2019 0219   BILITOT 0.5 03/12/2019 0219   GFRNONAA >60 03/12/2019 0219   GFRAA >60 03/12/2019 0219   Lab Results  Component Value Date   CHOL 207 (H) 07/12/2016   HDL 61 07/12/2016   LDLCALC 137 (H) 07/12/2016   TRIG 43 07/12/2016   CHOLHDL 3.4 07/12/2016   Lab Results  Component Value Date   HGBA1C 5.1 03/10/2019   Lab Results  Component Value Date   VITAMINB12 894 03/08/2019   Lab Results  Component Value Date   TSH 2.212 03/08/2019       ASSESSMENT AND PLAN  Multiple sclerosis (HCC)  High risk medication use  Gait disturbance  Ataxia   1.  Continue Ocrevus.  Later this year we will check IgG/IgM and CBC with differential.  We will need to reimage later this year as well. 2.    Stay active and exercise as tolerated. 3.   Take trazodone at bedtime for insomnia. 4.   Return in 4 months or sooner if there are new or worsening neurologic symptoms.  Doyt Castellana A. Epimenio Foot, MD, [facility] 05/25/2019, 6:01 PM Certified in Neurology, Clinical Neurophysiology, Sleep Medicine and Neuroimaging  [facility] [address] [place], [place] [address] [phone]

## 2019-05-26 DIAGNOSIS — Z0289 Encounter for other administrative examinations: Secondary | ICD-10-CM

## 2019-05-28 ENCOUNTER — Telehealth: Payer: Self-pay | Admitting: Neurology

## 2019-05-28 NOTE — Telephone Encounter (Signed)
Created in error

## 2019-06-01 ENCOUNTER — Telehealth: Payer: Self-pay | Admitting: *Deleted

## 2019-06-01 DIAGNOSIS — Z0289 Encounter for other administrative examinations: Secondary | ICD-10-CM

## 2019-06-01 NOTE — Telephone Encounter (Signed)
Gave completed/signed form for [employer]PS (Medical Information and restriction assessment) back to medical recrods to process for pt.

## 2019-06-02 NOTE — Telephone Encounter (Signed)
Gave completed/signed form for pt second job back to medical records to process.  It was a Comptroller form in response to accomodation request. Dr. Epimenio Foot provided her intermittent time 1x/month (3 days per episode) for possible MS exacerbations as well.

## 2019-06-03 NOTE — Telephone Encounter (Addendum)
Spoke with Dr. Epimenio Foot. He states pt max weight that she can lift is 10lb. Called Ashley back and LVM with this info. Provided office number if she has any further questions.

## 2019-06-03 NOTE — Telephone Encounter (Signed)
Took call from phone room. Spoke with Morrie Sheldon from [facility] in regards to [facility] forms Dr. Epimenio Foot filled out. She wants to know what the max weight she can lift. She can be reach back at [phone]. Ok to LVM if she does not pick up.

## 2019-07-16 ENCOUNTER — Encounter: Payer: Self-pay | Admitting: *Deleted

## 2019-07-20 DIAGNOSIS — Z0289 Encounter for other administrative examinations: Secondary | ICD-10-CM

## 2019-07-28 ENCOUNTER — Telehealth: Payer: Self-pay | Admitting: *Deleted

## 2019-07-28 NOTE — Telephone Encounter (Signed)
Pt call for update on her accomodation form. Please call [phone] or email.

## 2019-07-28 NOTE — Telephone Encounter (Signed)
Abigail Lewis- we completed a form back in May for her. Is there another form that needs to be filled out? I do not have a form needing completing

## 2019-08-20 ENCOUNTER — Telehealth: Payer: Self-pay | Admitting: Neurology

## 2019-08-20 DIAGNOSIS — N3000 Acute cystitis without hematuria: Secondary | ICD-10-CM

## 2019-08-20 MED ORDER — NITROFURANTOIN MONOHYD MACRO 100 MG PO CAPS: ORAL_CAPSULE | ORAL | 0 refills | Status: DC

## 2019-08-20 NOTE — Telephone Encounter (Signed)
Called patient back to get further information. For the past three weeks she has had left upper thigh pain. Denies any injuries, has not started any new meds, no signs of infections currently. She did state that she had a UTI last week that she treated OTC. Still having urinary urgency. No burning pain, no strong odor. She has not taken OTC tylenol or ibuprofen for thigh pain. States pain is worse at night while she is sleeping. Feels like a muscle spasm. Advised I will speak with MD and call her back with his recommendation.   Spoke with Dr. Epimenio Foot. He recommends for possible UTI: Nitrofurantoin 100mg  #14, directions: 1 pill po BID. For thigh pain, she should try OTC meds first. If pain continues and no better next week, she should call back and let know. I called pt back and relayed this. She verbalized understanding. I e-scribed rx to [id number], [place] - [address].

## 2019-08-20 NOTE — Telephone Encounter (Signed)
Pt would like to let the physician know have  left leg pain in upper thigh. Pt would like a call from the nurse.

## 2019-08-27 ENCOUNTER — Other Ambulatory Visit: Payer: Self-pay | Admitting: Neurology

## 2019-08-27 ENCOUNTER — Telehealth: Payer: Self-pay | Admitting: Neurology

## 2019-08-27 DIAGNOSIS — G35 Multiple sclerosis: Secondary | ICD-10-CM

## 2019-08-27 DIAGNOSIS — R269 Unspecified abnormalities of gait and mobility: Secondary | ICD-10-CM

## 2019-08-27 MED ORDER — DALFAMPRIDINE ER 10 MG PO TB12: 10.0000 mg | ORAL_TABLET | Freq: Two times a day (BID) | ORAL | 1 refills | Status: DC

## 2019-08-27 MED ORDER — GABAPENTIN 300 MG PO CAPS: 300.0000 mg | ORAL_CAPSULE | Freq: Three times a day (TID) | ORAL | 11 refills | Status: DC

## 2019-08-27 NOTE — Telephone Encounter (Signed)
Called pt back. Relayed Dr. Bonnita Hollow recommendation. She is agreeable to try this. Aware Dr. Epimenio Foot sent this in already. She will take 1 per day for a couple days, then increase to two per day for a couple days and then go up to TID.   She reports she is having difficulty standing up. She is wondering if she can try a medication to help with this. Advised there is dalfampridine that can help to improve walking. Per Dr. Epimenio Foot, ok for her to try this. She is aware medication will come from mail order specialty pharmacy. May require PA, which we will work on if need be.

## 2019-08-27 NOTE — Telephone Encounter (Signed)
OTC meds ineffective for her thigh pain. What would you recommend?

## 2019-08-27 NOTE — Telephone Encounter (Signed)
Pt called wanting to let the provider know that she tried the OTC medications that were suggested to her and they did not work for her. Please advise.

## 2019-08-27 NOTE — Telephone Encounter (Signed)
I will send in a prescription for gabapentin to the pharmacy on record  one po tid

## 2019-09-01 ENCOUNTER — Telehealth: Payer: Self-pay | Admitting: *Deleted

## 2019-09-01 NOTE — Telephone Encounter (Signed)
Submitted PA dalfampridine on CMM. KeyDanella Deis - PA Case ID: [id number]. Waiting on determination from [facility].

## 2019-09-01 NOTE — Telephone Encounter (Signed)
Request Reference Number: [id number]. DALFAMPRIDIN TAB 10MG  ER is approved through 03/03/2020. Your patient may now fill this prescription and it will be covered.

## 2019-09-10 ENCOUNTER — Encounter: Payer: Self-pay | Admitting: Neurology

## 2019-09-10 ENCOUNTER — Telehealth: Payer: Self-pay | Admitting: Neurology

## 2019-09-10 NOTE — Telephone Encounter (Signed)
I will write the letter.

## 2019-09-10 NOTE — Telephone Encounter (Signed)
Called pt to let her know Dr. Anne Hahn wrote letter for her since Dr. Epimenio Foot is out. She will come Monday to pick up. I placed up front for pick up.

## 2019-09-10 NOTE — Telephone Encounter (Signed)
Dr. Anne Hahn- are you willing to provide letter to pt since Dr. Epimenio Foot is out?

## 2019-09-10 NOTE — Telephone Encounter (Signed)
Pt called wanting to know if she can get a letter stating that she is immunocompromised so that she can get the covid booster injection. Please advise.

## 2019-10-13 ENCOUNTER — Other Ambulatory Visit: Payer: Self-pay

## 2019-10-13 ENCOUNTER — Encounter: Payer: Self-pay | Admitting: Neurology

## 2019-10-13 ENCOUNTER — Ambulatory Visit: Payer: 59 | Admitting: Neurology

## 2019-10-13 VITALS — BP 104/67 | HR 74 | Ht 63.0 in | Wt 154.0 lb

## 2019-10-13 DIAGNOSIS — E559 Vitamin D deficiency, unspecified: Secondary | ICD-10-CM | POA: Diagnosis not present

## 2019-10-13 DIAGNOSIS — G35 Multiple sclerosis: Secondary | ICD-10-CM | POA: Diagnosis not present

## 2019-10-13 DIAGNOSIS — Z79899 Other long term (current) drug therapy: Secondary | ICD-10-CM | POA: Diagnosis not present

## 2019-10-13 DIAGNOSIS — R269 Unspecified abnormalities of gait and mobility: Secondary | ICD-10-CM

## 2019-10-13 DIAGNOSIS — R251 Tremor, unspecified: Secondary | ICD-10-CM

## 2019-10-13 DIAGNOSIS — R200 Anesthesia of skin: Secondary | ICD-10-CM

## 2019-10-13 DIAGNOSIS — R39198 Other difficulties with micturition: Secondary | ICD-10-CM

## 2019-10-13 DIAGNOSIS — Z5181 Encounter for therapeutic drug level monitoring: Secondary | ICD-10-CM

## 2019-10-13 DIAGNOSIS — R202 Paresthesia of skin: Secondary | ICD-10-CM

## 2019-10-13 MED ORDER — GABAPENTIN 300 MG PO CAPS
ORAL_CAPSULE | ORAL | 11 refills | Status: DC
Start: 2019-10-13 — End: 2020-12-29

## 2019-10-13 MED ORDER — METOPROLOL SUCCINATE ER 25 MG PO TB24: 25.0000 mg | ORAL_TABLET | Freq: Every day | ORAL | 11 refills | Status: DC

## 2019-10-13 NOTE — Progress Notes (Signed)
[facility]  PATIENT: Abigail Lewis DOB: 05-Jun-1990  REFERRING DOCTOR OR PCP: Margie Ege (hospitalist) ; [facility] (PCP) SOURCE: Notes from recent emergency room and hospital stay and neurology, imaging and lab reports, MRI images personally reviewed.  _________________________________   HISTORICAL  CHIEF COMPLAINT:  Chief Complaint  Patient presents with  . Follow-up    RM 12, alone. Last seen 0503/21  . Multiple Sclerosis    On Ocrevus. Last: 04/23/19. Next: 10/22/19. Receives 600mg  IV q 6 months at [facility]. She has noticed appetite not as good intermittently. Still having bladder issues. Having pain in left leg still, taking gabapentin 300mg  po TID. Having tremors in left hand mostly, sometimes in her right hand.    HISTORY OF PRESENT ILLNESS:  Abigail Lewis is a 29 y.o. woman with relapsing remitting MS.  Update 10/13/19 She is on Ocrevus - her next infusion will be in a few weeks.  She tolerated it well.    She is walking without assistance.   She used to jog and is thinking about starting again now that her gait is better.    She notes that her legs seem stronger since starting dalfampridine.   She notes some tremors in her left hand.   It still feels clumsy.   Speech is no longer slurred.  Bladder is better on oxybutynin but she still has incontinence.   Vision is fine.  Recent eye appt showed no evidence of ON.  She notes pain in her left leg, mostly in the muscles of the left thigh.     I showed her her MRI images from earlier this year. She had an unusual presentation with a longitudinal lesion on cervical spine MRI.  However, anti-NMO and anti-MOG were both negative. She had multiple other more classic appearing spine and brain MS lesions including some of the brain that enhanced.   Additionally she had oligoclonal bands in the CSF.  Therefore, MS is more likely.      She notes some fatigue. Sleep is variable. She has a mental fog at times. She  notes some anxiety but no significant depression.  She did the covid 19 vaccination and recently got the booster.   MS history:  In 2018 she noted that her legs would get numb and achy.   Symptoms fluctuated but persisted through 2019 and 2020.  She saw Dr. Allena Katz at [facility] neurology in December 2019 and again for an EMG October 2020 which was normal.  In August 2020 she began to experience lightheadedness with dizziness but not vertigo.  In October, she was noting more issues with her legs experiencing weakness and clumsiness.    She had no falls but started to use a walker due to poor gait.    Symptoms progressed over the rest of 2020.  In January, her left hand started to have a tremor.    In early February 2021, she was unable to walk and needed to crawl down the stairs.   Her voice was slurred.  She had mild right visual changes.   She went to [facility] ED and MRI's of the brain and cervical spine were concerning for a demyelinating disorder.   She received 5 days of IV Solu-Medrol with some improvement. She continued to improve over the next few months.  Laboratory tests: While in the hospital, she had a lumbar puncture showing 5 oligoclonal bands and elevated IgG index .   Labwork showed positive ANA with elevated chromatin  Ab and negative SSA/SSB initially and then milldy increased SSA 2 days later, negative SPEP/IEF, negative anti-NMO.   She is HIV negative.   We subsequently checked the [facility] anti-NMO and MOG and they were negative.    Imaging:  MRIs of the brain, cervical spine and thoracic spine performed between February 13 and March 09, 2019.  The MRI of the brain shows multiple T2/FLAIR hyperintense foci in the cerebellum, right middle cerebellar peduncle, brainstem including periaqueductal gray and hemispheres.  Some of the foci enhanced after contrast.  Additionally, there appeared to be enhancement within the left optic nerve MRI of the cervical spine showed a longitudinal  lesion from C1-C2 throughC4 and patchy lesions elsewhere in the lower cervical and upper thoracic spinal cord.  MRI of the thoracic spine showed patchy lesions throughout the spinal cord.  There was no abnormal enhancement in the spinal cord.    REVIEW OF SYSTEMS: Constitutional: No fevers, chills, sweats, or change in appetite Eyes: No visual changes, double vision, eye pain Ear, nose and throat: No hearing loss, ear pain, nasal congestion, sore throat Cardiovascular: No chest pain, palpitations Respiratory: No shortness of breath at rest or with exertion.   No wheezes GastrointestinaI: No nausea, vomiting, diarrhea, abdominal pain, fecal incontinence Genitourinary: No dysuria, urinary retention or frequency.  No nocturia. Musculoskeletal: No neck pain, back pain Integumentary: No rash, pruritus, skin lesions Neurological: as above Psychiatric: She has had anxiety and depression. Endocrine: No palpitations, diaphoresis, change in appetite, change in weigh or increased thirst Hematologic/Lymphatic: No anemia, purpura, petechiae. Allergic/Immunologic: No itchy/runny eyes, nasal congestion, recent allergic reactions, rashes  ALLERGIES: Allergies  Allergen Reactions  . Penicillins Swelling    HOME MEDICATIONS:  Current Outpatient Medications:  .  acetaminophen (TYLENOL) 500 MG tablet, Take 2 tablets (1,000 mg total) by mouth every 8 (eight) hours as needed for moderate pain., Disp: 30 tablet, Rfl: 0 .  dalfampridine 10 MG TB12, Take 1 tablet (10 mg total) by mouth in the morning and at bedtime., Disp: 180 tablet, Rfl: 1 .  gabapentin (NEURONTIN) 300 MG capsule, One po qAm, one po qPM and two po qHS, Disp: 120 capsule, Rfl: 11 .  nitrofurantoin, macrocrystal-monohydrate, (MACROBID) 100 MG capsule, Take 1 capsule by mouth twice daily for 7 days, Disp: 14 capsule, Rfl: 0 .  ocrelizumab (OCREVUS) 300 MG/10ML injection, Inject 600 mg into the vein every 6 (six) months. [facility].,  Disp: , Rfl:  .  pravastatin (PRAVACHOL) 20 MG tablet, Take 20 mg by mouth daily., Disp: , Rfl:  .  traZODone (DESYREL) 50 MG tablet, TAKE 1 TABLET BY MOUTH AT BEDTIME AS NEEDED FOR SLEEP (Patient taking differently: Take 50 mg by mouth at bedtime as needed for sleep. ), Disp: 30 tablet, Rfl: 0 .  metoprolol succinate (TOPROL XL) 25 MG 24 hr tablet, Take 1 tablet (25 mg total) by mouth daily., Disp: 30 tablet, Rfl: 11 .  sertraline (ZOLOFT) 50 MG tablet, Take 1 tablet (50 mg total) by mouth daily., Disp: 30 tablet, Rfl: 0  PAST MEDICAL HISTORY: Past Medical History:  Diagnosis Date  . ANA positive 02/13/2019  . Anxiety   . Depression   . Hyperlipidemia 12/03/2018   LDL=155    PAST SURGICAL HISTORY: Past Surgical History:  Procedure Laterality Date  . NO PAST SURGERIES    . TOOTH EXTRACTION      FAMILY HISTORY: Family History  Problem Relation Age of Onset  . Stroke Mother   . Diabetes Father   .  Healthy Sister   . Autism Brother     SOCIAL HISTORY:  Social History   Socioeconomic History  . Marital status: Single    Spouse name: Not on file  . Number of children: 0  . Years of education: 40  . Highest education level: Not on file  Occupational History  . Occupation: [employer], post office  Tobacco Use  . Smoking status: Never Smoker  . Smokeless tobacco: Never Used  Vaping Use  . Vaping Use: Never used  Substance and Sexual Activity  . Alcohol use: Yes    Comment: wine sometimes   . Drug use: No  . Sexual activity: Yes    Birth control/protection: Pill  Other Topics Concern  . Not on file  Social History Narrative   Right handed    Caffeine use: sometimes   She works as a Office manager and part-time as Lawyer.   She lives alone.  No children.    Social Determinants of Health   Financial Resource Strain:   . Difficulty of Paying Living Expenses: Not on file  Food Insecurity:   . Worried About Programme researcher, broadcasting/film/video in the Last Year: Not on file  . Ran Out  of Food in the Last Year: Not on file  Transportation Needs:   . Lack of Transportation (Medical): Not on file  . Lack of Transportation (Non-Medical): Not on file  Physical Activity:   . Days of Exercise per Week: Not on file  . Minutes of Exercise per Session: Not on file  Stress:   . Feeling of Stress : Not on file  Social Connections:   . Frequency of Communication with Friends and Family: Not on file  . Frequency of Social Gatherings with Friends and Family: Not on file  . Attends Religious Services: Not on file  . Active Member of Clubs or Organizations: Not on file  . Attends Banker Meetings: Not on file  . Marital Status: Not on file  Intimate Partner Violence:   . Fear of Current or Ex-Partner: Not on file  . Emotionally Abused: Not on file  . Physically Abused: Not on file  . Sexually Abused: Not on file     PHYSICAL EXAM  Vitals:   10/13/19 1254  BP: 104/67  Pulse: 74  Weight: 154 lb (69.9 kg)  Height: 5\' 3"  (1.6 m)    Body mass index is 27.28 kg/m.  No exam data present   General: The patient is well-developed and well-nourished and in no acute distress  HEENT:  Head is [place]/AT.  Sclera are anicteric.     Skin: Extremities are without rash or  edema.   Neurologic Exam  Mental status: The patient is alert and oriented x 3 at the time of the examination. The patient has apparent normal recent and remote memory, with an apparently normal attention span and concentration ability.   Speech is normal.  Cranial nerves: Extraocular movements are full. Pupils are equal, round, and reactive to light and accomodation.  Color vision is symmetric.  Facial strength is normal.  Trapezius and sternocleidomastoid strength is normal. No dysarthria is noted.    No obvious hearing deficits are noted.  Motor:  Muscle bulk is normal.   Tone is normal. Strength is  5 / 5 in all 4 extremities.   Sensory: She had intact sensation to touch, temperature and  vibration.  Coordination: Cerebellar testing reveals mildly left finger-nose-finger.  Heel-to-shin is slightly reduced on the left.  Gait and station: Station is normal. Currently, her gait is fairly normal but the tandem gait is still wide. Romberg is negative now.   Reflexes: Deep tendon reflexes are symmetric and normal in arms and increased at the legs.  There is no ankle clonus.[name] Kitchen          DIAGNOSTIC DATA (LABS, IMAGING, TESTING) - I reviewed patient records, labs, notes, testing and imaging myself where available.  Lab Results  Component Value Date   WBC 11.8 (H) 03/12/2019   HGB 11.2 (L) 03/12/2019   HCT 32.8 (L) 03/12/2019   MCV 92.7 03/12/2019   PLT 238 03/12/2019      Component Value Date/Time   NA 139 03/12/2019 0219   K 3.8 03/12/2019 0219   CL 106 03/12/2019 0219   CO2 24 03/12/2019 0219   GLUCOSE 110 (H) 03/12/2019 0219   BUN 13 03/12/2019 0219   CREATININE 0.72 03/12/2019 0219   CALCIUM 9.3 03/12/2019 0219   PROT 6.2 (L) 03/12/2019 0219   ALBUMIN 3.3 (L) 03/12/2019 0219   ALBUMIN 4.0 03/07/2019 2318   AST 17 03/12/2019 0219   ALT 16 03/12/2019 0219   ALKPHOS 37 (L) 03/12/2019 0219   BILITOT 0.5 03/12/2019 0219   GFRNONAA >60 03/12/2019 0219   GFRAA >60 03/12/2019 0219   Lab Results  Component Value Date   CHOL 207 (H) 07/12/2016   HDL 61 07/12/2016   LDLCALC 137 (H) 07/12/2016   TRIG 43 07/12/2016   CHOLHDL 3.4 07/12/2016   Lab Results  Component Value Date   HGBA1C 5.1 03/10/2019   Lab Results  Component Value Date   VITAMINB12 894 03/08/2019   Lab Results  Component Value Date   TSH 2.212 03/08/2019       ASSESSMENT AND PLAN  Multiple sclerosis (HCC) - Plan: SAR CoV2 Serology (COVID 19)AB(IGG)IA, IgG, IgA, IgM, CBC with Differential/Platelet, Hepatic function panel, VITAMIN D 25 Hydroxy (Vit-D Deficiency, Fractures), MR BRAIN W WO CONTRAST, MR CERVICAL SPINE W WO CONTRAST  High risk medication use  Encounter for monitoring  immunomodulating therapy - Plan: SAR CoV2 Serology (COVID 19)AB(IGG)IA, IgG, IgA, IgM, CBC with Differential/Platelet, Hepatic function panel  Vitamin D deficiency - Plan: VITAMIN D 25 Hydroxy (Vit-D Deficiency, Fractures)  Urinary dysfunction  Gait disturbance  Numbness and tingling  Tremor   1.  Continue Ocrevus.  We will check IgG/IgM and CBC with differential.   We will also check MRI of the brain and cervical spine to determine if there is any subclinical progression. This is occurring we would need to consider a different disease modifying therapy.  2.    Stay active and exercise as tolerated. 3.   Increase gabapentin to 300-300-600 and continue trazodone at night. I will also add low-dose metoprolol to see if it helps her tremor. 4.   She has had her Covid vaccinations and a recent booster. She is on Ocrevus which has been shown to reduce response to vaccination. I will check the IgG to determine if she has had a response 5.   Return in 6 months or sooner if there are new or worsening neurologic symptoms.  45-minute office visit with the majority of the time spent face-to-face for history and physical, discussion/counseling and decision-making.  Additional time with record review and documentation.   Abigail Lewis A. Epimenio Foot, MD, [facility] 10/13/2019, 2:05 PM Certified in Neurology, Clinical Neurophysiology, Sleep Medicine and Neuroimaging  [facility] [address], [address] [place], [place] [address] [phone]

## 2019-10-14 ENCOUNTER — Telehealth: Payer: Self-pay | Admitting: Neurology

## 2019-10-14 ENCOUNTER — Telehealth: Payer: Self-pay | Admitting: *Deleted

## 2019-10-14 DIAGNOSIS — R7989 Other specified abnormal findings of blood chemistry: Secondary | ICD-10-CM

## 2019-10-14 LAB — CBC WITH DIFFERENTIAL/PLATELET
Basophils Absolute: 0 10*3/uL (ref 0.0–0.2)
Basos: 1 %
EOS (ABSOLUTE): 0.1 10*3/uL (ref 0.0–0.4)
Eos: 2 %
Hematocrit: 33.8 % — ABNORMAL LOW (ref 34.0–46.6)
Hemoglobin: 11.7 g/dL (ref 11.1–15.9)
Immature Grans (Abs): 0 10*3/uL (ref 0.0–0.1)
Immature Granulocytes: 0 %
Lymphocytes Absolute: 1.1 10*3/uL (ref 0.7–3.1)
Lymphs: 32 %
MCH: 32.7 pg (ref 26.6–33.0)
MCHC: 34.6 g/dL (ref 31.5–35.7)
MCV: 94 fL (ref 79–97)
Monocytes Absolute: 0.2 10*3/uL (ref 0.1–0.9)
Monocytes: 7 %
Neutrophils Absolute: 2 10*3/uL (ref 1.4–7.0)
Neutrophils: 58 %
Platelets: 243 10*3/uL (ref 150–450)
RBC: 3.58 x10E6/uL — ABNORMAL LOW (ref 3.77–5.28)
RDW: 12.6 % (ref 11.7–15.4)
WBC: 3.4 10*3/uL (ref 3.4–10.8)

## 2019-10-14 LAB — HEPATIC FUNCTION PANEL
ALT: 15 IU/L (ref 0–32)
AST: 13 IU/L (ref 0–40)
Albumin: 4.6 g/dL (ref 3.8–4.8)
Alkaline Phosphatase: 44 IU/L (ref 44–121)
Bilirubin Total: 0.3 mg/dL (ref 0.0–1.2)
Bilirubin, Direct: <0.1 mg/dL (ref 0.00–0.40)
Total Protein: 6.8 g/dL (ref 6.0–8.5)

## 2019-10-14 LAB — SAR COV2 SEROLOGY (COVID19)AB(IGG),IA: DiaSorin SARS-CoV-2 Ab, IgG: POSITIVE

## 2019-10-14 LAB — IGG, IGA, IGM
IgA/Immunoglobulin A, Serum: 355 mg/dL — ABNORMAL HIGH (ref 87–352)
IgG (Immunoglobin G), Serum: 1113 mg/dL (ref 586–1602)
IgM (Immunoglobulin M), Srm: 55 mg/dL (ref 26–217)

## 2019-10-14 LAB — VITAMIN D 25 HYDROXY (VIT D DEFICIENCY, FRACTURES): Vit D, 25-Hydroxy: 24.6 ng/mL — ABNORMAL LOW (ref 30.0–100.0)

## 2019-10-14 MED ORDER — VITAMIN D (ERGOCALCIFEROL) 1.25 MG (50000 UNIT) PO CAPS: ORAL_CAPSULE | ORAL | 1 refills | Status: DC

## 2019-10-14 NOTE — Telephone Encounter (Signed)
-----   Message from Asa Lente, MD sent at 10/14/2019 12:15 PM EDT ----- Labs are mostly good.  The vitamin D was low and I would like her to take 50,000 units weekly for 6 months.  Afterwards she can take 5000 units daily OTC.  The Covid antibody test showed that the vaccine has worked as the antibodies are positive

## 2019-10-14 NOTE — Telephone Encounter (Signed)
Called and spoke with pt about labs results per Dr. Epimenio Foot note. She verbalized understanding. I e-scribed rx Vit D to pharmacy.

## 2019-10-14 NOTE — Telephone Encounter (Signed)
no to the covid questions MR Brain w/wo contrast & MR Cervical spine w/wo contrast Dr. Epimenio Foot [employer] Auth: NPR via [employer] website. Patient is scheduled at [facility] for 10/20/19.

## 2019-10-20 ENCOUNTER — Ambulatory Visit (INDEPENDENT_AMBULATORY_CARE_PROVIDER_SITE_OTHER): Payer: 59

## 2019-10-20 ENCOUNTER — Other Ambulatory Visit: Payer: Self-pay

## 2019-10-20 DIAGNOSIS — G35 Multiple sclerosis: Secondary | ICD-10-CM

## 2019-10-20 MED ORDER — GADOBENATE DIMEGLUMINE 529 MG/ML IV SOLN
15.0000 mL | Freq: Once | INTRAVENOUS | Status: AC | PRN
  Administered 2019-10-20: 15 mL via INTRAVENOUS

## 2019-11-04 ENCOUNTER — Telehealth: Payer: Self-pay | Admitting: Neurology

## 2019-11-04 MED ORDER — METOPROLOL SUCCINATE ER 50 MG PO TB24: 50.0000 mg | ORAL_TABLET | Freq: Every day | ORAL | 11 refills | Status: DC

## 2019-11-04 NOTE — Telephone Encounter (Signed)
Pt called wanting to inform the provider that the metoprolol succinate (TOPROL XL) 25 MG 24 hr tablet is not working at this dosage and she would like to know if this can be increased or changed. Please advise.

## 2019-11-04 NOTE — Telephone Encounter (Signed)
Called pt back. Relayed Dr. Bonnita Hollow recommendation. She is agreeable to this plan. [facility]. She also feels she may have UTI. Has strong odor, burning pain sometimes with urination. She is not drinking a lot of water. She is going to try and increase water intake on a daily basis. She will call back if sx do not improve.

## 2019-11-04 NOTE — Telephone Encounter (Signed)
Dr. Epimenio Foot- please advise. Pt currently on metoprolol 25mg  24hr tab po qd.

## 2019-11-04 NOTE — Telephone Encounter (Signed)
We can increase to 50 mg daily metoprolol XR

## 2019-11-05 ENCOUNTER — Ambulatory Visit: Payer: 59

## 2019-11-06 ENCOUNTER — Telehealth: Payer: Self-pay | Admitting: Neurology

## 2019-11-06 NOTE — Telephone Encounter (Signed)
I returned the call to the patient. She would like the handicap placard application mailed to her home address. I confirmed it is correct in Epic. She is aware that Dr. Epimenio Foot will sign it on Monday and it will be placed in the mail. She knows to allow for a couple of extra days with our mailing system.

## 2019-11-06 NOTE — Telephone Encounter (Signed)
Pt called, I mailed the handicap form for a sticker without sending the money order. Could you send me another handicap form for me to resend?

## 2019-11-23 DIAGNOSIS — Z0289 Encounter for other administrative examinations: Secondary | ICD-10-CM

## 2019-11-25 ENCOUNTER — Telehealth: Payer: Self-pay | Admitting: *Deleted

## 2019-11-25 NOTE — Telephone Encounter (Signed)
Gave completed/signed employee medical certification form in response to an accomodation request back to medical records to process for pt.

## 2019-12-01 NOTE — Telephone Encounter (Signed)
Dr. Epimenio Foot made addendum to paperwork stating pt can return to work with restrictions. Stanton Kidney will re-send form with updates.

## 2019-12-08 ENCOUNTER — Ambulatory Visit: Payer: 59

## 2019-12-24 DIAGNOSIS — Z0289 Encounter for other administrative examinations: Secondary | ICD-10-CM

## 2019-12-29 ENCOUNTER — Telehealth: Payer: Self-pay | Admitting: *Deleted

## 2019-12-29 NOTE — Telephone Encounter (Signed)
Gave completed/signed [employer] medical information and restriction assessment form back to medical records to process for pt.

## 2020-01-25 ENCOUNTER — Telehealth: Payer: Self-pay | Admitting: *Deleted

## 2020-01-25 NOTE — Telephone Encounter (Signed)
I re faxed pt form 2 times on 01/25/[phone] to [facility]

## 2020-02-01 ENCOUNTER — Ambulatory Visit: Payer: 59

## 2020-02-20 ENCOUNTER — Other Ambulatory Visit: Payer: Self-pay | Admitting: Neurology

## 2020-02-20 DIAGNOSIS — R7989 Other specified abnormal findings of blood chemistry: Secondary | ICD-10-CM

## 2020-02-24 ENCOUNTER — Telehealth: Payer: Self-pay | Admitting: Neurology

## 2020-02-24 DIAGNOSIS — N3000 Acute cystitis without hematuria: Secondary | ICD-10-CM

## 2020-02-24 MED ORDER — NITROFURANTOIN MONOHYD MACRO 100 MG PO CAPS: ORAL_CAPSULE | ORAL | 0 refills | Status: DC

## 2020-02-24 NOTE — Telephone Encounter (Addendum)
Spoke with Dr. Epimenio Foot, he gave VO for the following:  Nitrofurantoin 100mg  #14, directions: 1 pill po BID.  I called pt and relayed Dr. recommendation. She is agreeable to this. [facility].

## 2020-02-24 NOTE — Telephone Encounter (Signed)
Pt. states she has an UTI & she has tried cranberry juice & otc medicaiton. She states these hasn't worked. Please advise.

## 2020-02-24 NOTE — Addendum Note (Signed)
Addended by: Arther Abbott on: 02/24/2020 09:22 AM   Modules accepted: Orders

## 2020-02-25 ENCOUNTER — Telehealth: Payer: Self-pay | Admitting: Neurology

## 2020-02-25 DIAGNOSIS — R269 Unspecified abnormalities of gait and mobility: Secondary | ICD-10-CM

## 2020-02-25 DIAGNOSIS — G35 Multiple sclerosis: Secondary | ICD-10-CM

## 2020-02-25 MED ORDER — DALFAMPRIDINE ER 10 MG PO TB12: 10.0000 mg | ORAL_TABLET | Freq: Two times a day (BID) | ORAL | 1 refills | Status: DC

## 2020-02-25 NOTE — Telephone Encounter (Signed)
E-escribed rx to pharmacy as requested.

## 2020-02-25 NOTE — Telephone Encounter (Signed)
Pt request refill dalfampridine 10 MG TB12 at [facility]

## 2020-02-29 ENCOUNTER — Other Ambulatory Visit: Payer: Self-pay | Admitting: *Deleted

## 2020-02-29 DIAGNOSIS — G35D Multiple sclerosis, unspecified: Secondary | ICD-10-CM

## 2020-02-29 DIAGNOSIS — G35 Multiple sclerosis: Secondary | ICD-10-CM

## 2020-02-29 DIAGNOSIS — R269 Unspecified abnormalities of gait and mobility: Secondary | ICD-10-CM

## 2020-02-29 MED ORDER — DALFAMPRIDINE ER 10 MG PO TB12: 10.0000 mg | ORAL_TABLET | Freq: Two times a day (BID) | ORAL | 2 refills | Status: DC

## 2020-03-02 ENCOUNTER — Other Ambulatory Visit: Payer: Self-pay | Admitting: *Deleted

## 2020-03-02 DIAGNOSIS — G35 Multiple sclerosis: Secondary | ICD-10-CM

## 2020-03-02 DIAGNOSIS — R269 Unspecified abnormalities of gait and mobility: Secondary | ICD-10-CM

## 2020-03-02 DIAGNOSIS — G35D Multiple sclerosis, unspecified: Secondary | ICD-10-CM

## 2020-03-02 MED ORDER — DALFAMPRIDINE ER 10 MG PO TB12: 10.0000 mg | ORAL_TABLET | Freq: Two times a day (BID) | ORAL | 2 refills | Status: DC

## 2020-03-11 ENCOUNTER — Ambulatory Visit: Payer: Self-pay

## 2020-03-23 ENCOUNTER — Ambulatory Visit: Payer: Self-pay

## 2020-04-04 ENCOUNTER — Telehealth: Payer: Self-pay | Admitting: Neurology

## 2020-04-04 NOTE — Telephone Encounter (Signed)
Pt called, updated insurance information:  [employer] Member ID: [id number] Group ID: [id number] BIN: [id number] PCN: [id number]

## 2020-04-04 NOTE — Telephone Encounter (Signed)
Printed and gave copy to [facility] for their records.

## 2020-04-11 ENCOUNTER — Ambulatory Visit: Payer: 59 | Admitting: Neurology

## 2020-04-11 ENCOUNTER — Ambulatory Visit: Payer: Self-pay

## 2020-04-13 ENCOUNTER — Telehealth: Payer: Self-pay | Admitting: Neurology

## 2020-04-13 ENCOUNTER — Encounter: Payer: Self-pay | Admitting: *Deleted

## 2020-04-13 NOTE — Telephone Encounter (Addendum)
Called pt back. She would like letter stating she can work a full 8 hr shift w/ no restrictions. She will pick up letter before 5pm today. Letter placed up front for pick up.

## 2020-04-13 NOTE — Telephone Encounter (Signed)
Pt. states she needs a medical report from doctor that she is able to work overtime with normal job duties. Please advise.

## 2020-04-18 ENCOUNTER — Telehealth: Payer: Self-pay | Admitting: Neurology

## 2020-04-18 NOTE — Telephone Encounter (Signed)
Pt called, my employer needs a letter stating I can work overtime. Would like a call from the nurse.

## 2020-04-18 NOTE — Telephone Encounter (Signed)
Dr. Sater- are you ok with providing this? 

## 2020-04-26 ENCOUNTER — Encounter: Payer: Self-pay | Admitting: Neurology

## 2020-04-26 ENCOUNTER — Other Ambulatory Visit: Payer: Self-pay

## 2020-04-26 ENCOUNTER — Ambulatory Visit: Payer: BLUE CROSS/BLUE SHIELD | Admitting: Neurology

## 2020-04-26 ENCOUNTER — Ambulatory Visit: Payer: Self-pay

## 2020-04-26 VITALS — BP 95/60 | HR 71 | Ht 63.0 in | Wt 161.5 lb

## 2020-04-26 DIAGNOSIS — Z5181 Encounter for therapeutic drug level monitoring: Secondary | ICD-10-CM

## 2020-04-26 DIAGNOSIS — E559 Vitamin D deficiency, unspecified: Secondary | ICD-10-CM | POA: Diagnosis not present

## 2020-04-26 DIAGNOSIS — Z796 Encounter for therapeutic drug level monitoring: Secondary | ICD-10-CM | POA: Insufficient documentation

## 2020-04-26 DIAGNOSIS — R269 Unspecified abnormalities of gait and mobility: Secondary | ICD-10-CM | POA: Diagnosis not present

## 2020-04-26 DIAGNOSIS — R251 Tremor, unspecified: Secondary | ICD-10-CM

## 2020-04-26 DIAGNOSIS — Z79899 Other long term (current) drug therapy: Secondary | ICD-10-CM

## 2020-04-26 DIAGNOSIS — G35 Multiple sclerosis: Secondary | ICD-10-CM

## 2020-04-26 DIAGNOSIS — R39198 Other difficulties with micturition: Secondary | ICD-10-CM

## 2020-04-26 MED ORDER — METOPROLOL SUCCINATE ER 25 MG PO TB24: 25.0000 mg | ORAL_TABLET | Freq: Every day | ORAL | 4 refills | Status: DC

## 2020-04-26 NOTE — Progress Notes (Signed)
[facility]  PATIENT: Abigail Lewis DOB: [birth year]  REFERRING DOCTOR OR PCP: Margie Ege (hospitalist) ; [facility] (PCP) SOURCE: Notes from recent emergency room and hospital stay and neurology, imaging and lab reports, MRI images personally reviewed.  _________________________________   HISTORICAL  CHIEF COMPLAINT:  Chief Complaint  Patient presents with  . Follow-up    RM 12, alone. Last seen 10/13/2019. On Ocrevus for MS. Last infusion: 10/22/19, next infusion 05/05/20. BP lower in office today. Feels metoprolol may be affecting this. Gets dizzy at times.     HISTORY OF PRESENT ILLNESS:  Abigail Lewis is a 30 y.o. woman with relapsing remitting MS.  Update 04/26/2020 She is on Ocrevus.  She tolerated it well.    She has no exacerbations and is doing better.  She is walking better and can now go two miles without any assistive device.   She hasn't tried jogging yet.     She notes that her legs seem stronger since starting dalfampridine.     Metoprolol has helped her left hand tremor but her BP is low (today is 95/60). She feels the hand clumsiness is better than last year.    Speech is no longer slurred.  Bladder is better on Vesicare.   Vision is fine.  Recent eye appt showed no evidence of ON.  She notes pain in her left leg, mostly in the muscles of the left thigh.     She notes some fatigue. Sleep is variable. She has a mental fog at times. She notes some anxiety but no significant depression.  She would like a note to say she can work overtime (on computer mostly working from home)  MRI images are unusual.   She presented with  a longitudinal lesion on cervical spine MRI.  However, anti-NMO and anti-MOG were both negative. She had multiple other more classic appearing spine and brain MS lesions including some of the brain that enhanced.   Additionally she had oligoclonal bands in the CSF.  Therefore, MS is more likely.     She did the covid 19  vaccination and recently got the booster.   MS history:  In 2018 she noted that her legs would get numb and achy.   Symptoms fluctuated but persisted through 2019 and 2020.  She saw Dr. Allena Katz at [facility] neurology in December 2019 and again for an EMG October 2020 which was normal.  In August 2020 she began to experience lightheadedness with dizziness but not vertigo.  In October, she was noting more issues with her legs experiencing weakness and clumsiness.    She had no falls but started to use a walker due to poor gait.    Symptoms progressed over the rest of 2020.  In January, her left hand started to have a tremor.    In early February 2021, she was unable to walk and needed to crawl down the stairs.   Her voice was slurred.  She had mild right visual changes.   She went to the [facility] ED and MRI's of the brain and cervical spine were concerning for a demyelinating disorder.   She received 5 days of IV Solu-Medrol with some improvement. She continued to improve over the next few months.  Laboratory tests: While in the hospital, she had a lumbar puncture showing 5 oligoclonal bands and elevated IgG index .   Labwork showed positive ANA with elevated chromatin Ab and negative SSA/SSB initially and then milldy increased SSA 2  days later, negative SPEP/IEF, negative anti-NMO.   She is HIV negative.   We subsequently checked the [facility] lab anti-NMO and MOG and they were negative.    Imaging:  MRIs of the brain, cervical spine and thoracic spine performed between February 13 and March 09, 2019.  The MRI of the brain shows multiple T2/FLAIR hyperintense foci in the cerebellum, right middle cerebellar peduncle, brainstem including periaqueductal gray and hemispheres.  Some of the foci enhanced after contrast.  Additionally, there appeared to be enhancement within the left optic nerve MRI of the cervical spine showed a longitudinal lesion from C1-C2 throughC4 and patchy lesions elsewhere in the lower  cervical and upper thoracic spinal cord.  MRI of the thoracic spine showed patchy lesions throughout the spinal cord.  There was no abnormal enhancement in the spinal cord.  MRI brain 10/20/2019 shows multiple T2/FLAIR hyperintense foci in the cerebellum, brainstem, thalamus and hemispheres in a pattern and configuration consistent with chronic demyelinating plaque associated with multiple sclerosis.  None of the foci enhances or appears to be acute.  There appears to be 1 nonenhancing focus in the cerebellum that was not present on the 03/07/2019 MRI.  All the foci that enhanced on the earlier brain MRI no longer do so.      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  MRI cervical spine 10/20/2019 shows Multiple T2 hyperintense foci within the brainstem, cerebellum and spinal cord in a pattern and configuration consistent with chronic demyelinating plaque associated with multiple sclerosis.  None of the foci appear to be acute.  They did not enhance.  The spinal cord lesions were all present on the 03/07/2019 MRI.    REVIEW OF SYSTEMS: Constitutional: No fevers, chills, sweats, or change in appetite Eyes: No visual changes, double vision, eye pain Ear, nose and throat: No hearing loss, ear pain, nasal congestion, sore throat Cardiovascular: No chest pain, palpitations Respiratory: No shortness of breath at rest or with exertion.   No wheezes GastrointestinaI: No nausea, vomiting, diarrhea, abdominal pain, fecal incontinence Genitourinary: No dysuria, urinary retention or frequency.  No nocturia. Musculoskeletal: No neck pain, back pain Integumentary: No rash, pruritus, skin lesions Neurological: as above Psychiatric: She has had anxiety and depression. Endocrine: No palpitations, diaphoresis, change in appetite, change in weigh or increased thirst Hematologic/Lymphatic: No anemia, purpura, petechiae. Allergic/Immunologic: No itchy/runny eyes, nasal congestion, recent allergic  reactions, rashes  ALLERGIES: Allergies  Allergen Reactions  . Penicillins Swelling    HOME MEDICATIONS:  Current Outpatient Medications:  .  dalfampridine 10 MG TB12, Take 1 tablet (10 mg total) by mouth in the morning and at bedtime., Disp: 180 tablet, Rfl: 2 .  gabapentin (NEURONTIN) 300 MG capsule, One po qAm, one po qPM and two po qHS, Disp: 120 capsule, Rfl: 11 .  ocrelizumab (OCREVUS) 300 MG/10ML injection, Inject 600 mg into the vein every 6 (six) months. [facility]NA Intrafusion., Disp: , Rfl:  .  pravastatin (PRAVACHOL) 20 MG tablet, Take 20 mg by mouth daily., Disp: , Rfl:  .  traZODone (DESYREL) 50 MG tablet, TAKE 1 TABLET BY MOUTH AT BEDTIME AS NEEDED FOR SLEEP (Patient taking differently: Take 50 mg by mouth at bedtime as needed for sleep.), Disp: 30 tablet, Rfl: 0 .  VITAMIN D PO, Take 5,000 Units by mouth daily., Disp: , Rfl:  .  metoprolol succinate (TOPROL XL) 25 MG 24 hr tablet, Take 1 tablet (25 mg total) by mouth daily., Disp: 90 tablet, Rfl: 4 .  sertraline (ZOLOFT)  50 MG tablet, Take 1 tablet (50 mg total) by mouth daily., Disp: 30 tablet, Rfl: 0  PAST MEDICAL HISTORY: Past Medical History:  Diagnosis Date  . ANA positive 02/13/2019  . Anxiety   . Depression   . Hyperlipidemia 12/03/2018   LDL=155    PAST SURGICAL HISTORY: Past Surgical History:  Procedure Laterality Date  . NO PAST SURGERIES    . TOOTH EXTRACTION      FAMILY HISTORY: Family History  Problem Relation Age of Onset  . Stroke Mother   . Diabetes Father   . Healthy Sister   . Autism Brother     SOCIAL HISTORY:  Social History   Socioeconomic History  . Marital status: Single    Spouse name: Not on file  . Number of children: 0  . Years of education: 73  . Highest education level: Not on file  Occupational History  . Occupation: [employer], post office  Tobacco Use  . Smoking status: Never Smoker  . Smokeless tobacco: Never Used  Vaping Use  . Vaping Use: Never used  Substance  and Sexual Activity  . Alcohol use: Yes    Comment: wine sometimes   . Drug use: No  . Sexual activity: Yes    Birth control/protection: Pill  Other Topics Concern  . Not on file  Social History Narrative   Right handed    Caffeine use: sometimes   She works as a Office manager and part-time as Lawyer.   She lives alone.  No children.    Social Determinants of Health   Financial Resource Strain: Not on file  Food Insecurity: Not on file  Transportation Needs: Not on file  Physical Activity: Not on file  Stress: Not on file  Social Connections: Not on file  Intimate Partner Violence: Not on file     PHYSICAL EXAM  Vitals:   04/26/20 1528  BP: 95/60  Pulse: 71  SpO2: 96%  Weight: 161 lb 8 oz (73.3 kg)  Height: 5\' 3"  (1.6 m)    Body mass index is 28.61 kg/m.  No exam data present   General: The patient is well-developed and well-nourished and in no acute distress  HEENT:  Head is [place]/AT.  Sclera are anicteric.     Skin: Extremities are without rash or  edema.   Neurologic Exam  Mental status: The patient is alert and oriented x 3 at the time of the examination. The patient has apparent normal recent and remote memory, with an apparently normal attention span and concentration ability.   Speech is normal.  Cranial nerves: Extraocular movements are full.    Color vision is symmetric.  Facial strength is normal.  Trapezius and sternocleidomastoid strength is normal. No dysarthria is noted.    No obvious hearing deficits are noted.  Motor:  Muscle bulk is normal.   Tone is normal. Strength is  5 / 5 in all 4 extremities.   Sensory: She had intact sensation to touch, temperature and vibration.  Coordination: Cerebellar testing reveals very mildly reduced left finger-nose-finger.  Heel-to-shin is slightly reduced on the left.     Gait and station: Station is normal. Currently, her gait is fairly normal but the tandem gait is mildly wide. Romberg is negative now.    Reflexes: Deep tendon reflexes are symmetric and normal in arms and increased at the legs.  There is no ankle clonus.          DIAGNOSTIC DATA (LABS, IMAGING, TESTING) - I  reviewed patient records, labs, notes, testing and imaging myself where available.  Lab Results  Component Value Date   WBC 3.4 10/13/2019   HGB 11.7 10/13/2019   HCT 33.8 (L) 10/13/2019   MCV 94 10/13/2019   PLT 243 10/13/2019      Component Value Date/Time   NA 139 03/12/2019 0219   K 3.8 03/12/2019 0219   CL 106 03/12/2019 0219   CO2 24 03/12/2019 0219   GLUCOSE 110 (H) 03/12/2019 0219   BUN 13 03/12/2019 0219   CREATININE 0.72 03/12/2019 0219   CALCIUM 9.3 03/12/2019 0219   PROT 6.8 10/13/2019 1349   ALBUMIN 4.6 10/13/2019 1349   AST 13 10/13/2019 1349   ALT 15 10/13/2019 1349   ALKPHOS 44 10/13/2019 1349   BILITOT 0.3 10/13/2019 1349   GFRNONAA >60 03/12/2019 0219   GFRAA >60 03/12/2019 0219   Lab Results  Component Value Date   CHOL 207 (H) 07/12/2016   HDL 61 07/12/2016   LDLCALC 137 (H) 07/12/2016   TRIG 43 07/12/2016   CHOLHDL 3.4 07/12/2016   Lab Results  Component Value Date   HGBA1C 5.1 03/10/2019   Lab Results  Component Value Date   VITAMINB12 894 03/08/2019   Lab Results  Component Value Date   TSH 2.212 03/08/2019       ASSESSMENT AND PLAN  Multiple sclerosis (HCC)  Gait abnormality  Vitamin D deficiency  Tremor  Urinary dysfunction  High risk medication use  Gait disturbance  Encounter for monitoring immunomodulating therapy   1.  Continue Ocrevus. IgG/IgM and CBC/Diff were fine 10/13/2019 so will hold off on labwork today.   Later this year,  check MRI of the brain and cervical spine to determine if there is any subclinical progression. If this is occurring we would need to consider a different disease modifying therapy.  2.    Stay active and exercise as tolerated.   Ok to work overtime at work. 3.    Continue gabapentin to 300-300-600 and  continue trazodone at night. Reduce metoprolol for her tremor as BP low 4.   Return in 6 months or sooner if there are new or worsening neurologic symptoms.    Enzo Treu A. Epimenio Foot, MD, [facility] 04/26/2020, 4:28 PM Certified in Neurology, Clinical Neurophysiology, Sleep Medicine and Neuroimaging  [facility] [address] [place], [place] [address] [phone]

## 2020-04-26 NOTE — Addendum Note (Signed)
Addended by: Asa Lente on: 04/26/2020 04:34 PM   Modules accepted: Orders

## 2020-04-27 ENCOUNTER — Telehealth: Payer: Self-pay | Admitting: Neurology

## 2020-04-27 ENCOUNTER — Telehealth: Payer: Self-pay | Admitting: *Deleted

## 2020-04-27 DIAGNOSIS — G35 Multiple sclerosis: Secondary | ICD-10-CM

## 2020-04-27 DIAGNOSIS — R5383 Other fatigue: Secondary | ICD-10-CM

## 2020-04-27 MED ORDER — MODAFINIL 200 MG PO TABS: 200.0000 mg | ORAL_TABLET | ORAL | 5 refills | Status: DC

## 2020-04-27 NOTE — Telephone Encounter (Signed)
Pt called, forgot to ask you about getting medication for fatigue. I have been feeling tired and fatigue. Would like a call from the nurse.

## 2020-04-27 NOTE — Telephone Encounter (Signed)
Submitted PA modafinil on CMM. Key: [id number].  PA Case ID: [id number] - Rx #: [id number]. Received instant approval.

## 2020-04-27 NOTE — Telephone Encounter (Signed)
Received fax from [employer] that PA is approved 04/27/20-04/27/21. PA#[employer]first ASO [id number].

## 2020-04-27 NOTE — Telephone Encounter (Addendum)
Per Dr. Epimenio Foot- ok to call in modafinil 200mg  po qam #90, 1 refill

## 2020-04-27 NOTE — Telephone Encounter (Signed)
Called pt and relayed Dr. Mallie Mussel recommendation. She is agreeable to the plan. Advised it may require PA via insurance. If so, we will work on this. She verbalized understanding and appreciation.

## 2020-05-16 ENCOUNTER — Encounter (HOSPITAL_COMMUNITY): Payer: Self-pay

## 2020-05-16 ENCOUNTER — Ambulatory Visit (HOSPITAL_COMMUNITY)
Admission: EM | Admit: 2020-05-16 | Discharge: 2020-05-16 | Disposition: A | Discharge status: Home / Self Care | Payer: BLUE CROSS/BLUE SHIELD

## 2020-05-16 ENCOUNTER — Other Ambulatory Visit: Payer: Self-pay

## 2020-05-16 ENCOUNTER — Telehealth: Payer: Self-pay | Admitting: Neurology

## 2020-05-16 DIAGNOSIS — G35 Multiple sclerosis: Secondary | ICD-10-CM | POA: Diagnosis not present

## 2020-05-16 HISTORY — DX: Multiple sclerosis: G35

## 2020-05-16 MED ORDER — PREDNISONE 50 MG PO TABS: 1250.0000 mg | ORAL_TABLET | Freq: Every day | ORAL | 0 refills | Status: DC

## 2020-05-16 NOTE — Telephone Encounter (Signed)
Patient presented to the lobby today stating she is having a MS flareup, weak legs, slurred speech, lightheadedness as well as dizziness. She would like to speak with a nurse and can wait in the lobby a while. Her best call back is  [phone]

## 2020-05-16 NOTE — Discharge Instructions (Signed)
Call your neurologist and make an appointment with them for as soon as possible.   Take the prednisone daily as prescribed.   Return or go to the Emergency Department if symptoms worsen or do not improve in the next few days.

## 2020-05-16 NOTE — Telephone Encounter (Addendum)
Called pt back. They prescribed herPrednisone 50mg , directions: take 25 tablets by mouth daily for 7 days. Advised this was a high dose. I placed her on hold and spoke with Dr. . He recommended 10tablets po today and follow up w/ Dr. Anne Hahn for further evaluation (labs/UA) since this was not completed at urgent care. I relayed this recommendation to patient. She will only take 10 tabets of prednisone today and I scheduled work in appt for tomorrow morning w/ Dr. Epimenio Foot at 8:15am, advised pt to check in by 800am.  She is also asking for restriction note/letter to detail need for walker so she can bring it to work. She will discuss tomorrow at appt w/ MD

## 2020-05-16 NOTE — Telephone Encounter (Signed)
Pt called back wanting to inform RN that the Urgent Care prescribed her Prednisone.

## 2020-05-16 NOTE — Telephone Encounter (Signed)
Called pt back. She ended up going to urgent care/[facility]. She is waiting to be seen at the moment. She will let us know how visit goes. If they instruct her to follow back up with our office, she will call back to schedule appt.

## 2020-05-16 NOTE — ED Provider Notes (Signed)
[facility]    CSN: [id number] Arrival date & time: 05/16/20  1021      History   Chief Complaint Chief Complaint  Patient presents with  . MS flare up    HPI Abigail Lewis is a 30 y.o. female.   Patient here for evaluation of slurred speech, gait problems, and numbness and tingling especially to the left side that started yesterday.  Reports having similar symptoms when diagnosed with multiple sclerosis and believes that she is having a flare.  Attempted to be seen in neurology office but they were unable to see her this morning.  Diagnosed with multiple sclerosis last year and has not had a flare since diagnosis. Denies any trauma, injury, or other precipitating event.  Denies any specific alleviating or aggravating factors.  Denies any fevers, chest pain, shortness of breath, N/V/D, abdominal pain, or headaches.   ROS: As per HPI, all other pertinent ROS negative   The history is provided by the patient.    Past Medical History:  Diagnosis Date  . ANA positive 02/13/2019  . Anxiety   . Depression   . Hyperlipidemia 12/03/2018   LDL=155  . Multiple sclerosis, relapsing-remitting [facility])     Patient Active Problem List   Diagnosis Date Noted  . Vitamin D deficiency 04/26/2020  . Encounter for monitoring immunomodulating therapy 04/26/2020  . Multiple sclerosis (HCC) 05/25/2019  . Demyelinating disease of central nervous system (HCC) 03/23/2019  . High risk medication use 03/23/2019  . Gait abnormality 03/23/2019  . Ataxia 03/23/2019  . Urinary dysfunction 03/23/2019  . Numbness and tingling 03/08/2019  . Hyperlipidemia 03/08/2019  . Anxiety 03/08/2019  . Tremor 03/07/2019  . History of miscarriage 07/12/2016    Past Surgical History:  Procedure Laterality Date  . NO PAST SURGERIES    . TOOTH EXTRACTION      OB History    Gravida  2   Para      Term      Preterm      AB  1   Living  0     SAB  1   IAB      Ectopic      Multiple       Live Births               Home Medications    Prior to Admission medications   Medication Sig Start Date End Date Taking? Authorizing Provider  predniSONE (DELTASONE) 50 MG tablet Take 25 tablets (1,250 mg total) by mouth daily for 7 days. 05/16/20 05/23/20 Yes Ivette Loyal, NP  dalfampridine 10 MG TB12 Take 1 tablet (10 mg total) by mouth in the morning and at bedtime. 03/02/20   Sater, Pearletha Furl, MD  gabapentin (NEURONTIN) 300 MG capsule One po qAm, one po qPM and two po qHS 10/13/19   Sater, Pearletha Furl, MD  metoprolol succinate (TOPROL XL) 25 MG 24 hr tablet Take 1 tablet (25 mg total) by mouth daily. 04/26/20   Sater, Pearletha Furl, MD  modafinil (PROVIGIL) 200 MG tablet Take 1 tablet (200 mg total) by mouth every morning. 04/27/20   Sater, Pearletha Furl, MD  ocrelizumab (OCREVUS) 300 MG/10ML injection Inject 600 mg into the vein every 6 (six) months. [facility] Intrafusion.    [provider]  pravastatin (PRAVACHOL) 20 MG tablet Take 20 mg by mouth daily. 02/28/19   [provider]  sertraline (ZOLOFT) 50 MG tablet Take 1 tablet (50 mg total) by mouth daily.  03/13/19 04/12/19  Margie Ege A, DO  traZODone (DESYREL) 50 MG tablet TAKE 1 TABLET BY MOUTH AT BEDTIME AS NEEDED FOR SLEEP Patient taking differently: Take 50 mg by mouth at bedtime as needed for sleep. 12/11/16   Lizbeth Bark, FNP  VITAMIN D PO Take 5,000 Units by mouth daily.    [provider]  Vitamin D, Ergocalciferol, (DRISDOL) 1.25 MG (50000 UNIT) CAPS capsule Take 1 capsule by mouth once a week. 05/04/20   [provider]    Family History Family History  Problem Relation Age of Onset  . Stroke Mother   . Diabetes Father   . Healthy Sister   . Autism Brother     Social History Social History   Tobacco Use  . Smoking status: Never Smoker  . Smokeless tobacco: Never Used  Vaping Use  . Vaping Use: Never used  Substance Use Topics  . Alcohol use: Yes    Comment: wine sometimes   .  Drug use: No     Allergies   Penicillins   Review of Systems Review of Systems  Neurological: Positive for tremors, weakness and light-headedness.  All other systems reviewed and are negative.    Physical Exam Triage Vital Signs ED Triage Vitals  Enc Vitals Group     BP 05/16/20 1102 105/62     Pulse Rate 05/16/20 1101 67     Resp 05/16/20 1101 17     Temp 05/16/20 1101 99.1 F (37.3 C)     Temp src --      SpO2 05/16/20 1101 100 %     Weight --      Height --      Head Circumference --      Peak Flow --      Pain Score 05/16/20 1059 7     Pain Loc --      Pain Edu? --      Excl. in GC? --    No data found.  Updated Vital Signs BP 105/62   Pulse 67   Temp 99.1 F (37.3 C)   Resp 17   LMP 04/24/2020 (Exact Date)   SpO2 100%   Breastfeeding No   Visual Acuity Right Eye Distance:   Left Eye Distance:   Bilateral Distance:    Right Eye Near:   Left Eye Near:    Bilateral Near:     Physical Exam Vitals and nursing note reviewed.  Constitutional:      General: She is not in acute distress.    Appearance: Normal appearance. She is not ill-appearing, toxic-appearing or diaphoretic.  HENT:     Head: Normocephalic and atraumatic.  Eyes:     Conjunctiva/sclera: Conjunctivae normal.  Cardiovascular:     Rate and Rhythm: Normal rate.     Pulses: Normal pulses.  Pulmonary:     Effort: Pulmonary effort is normal.  Abdominal:     General: Abdomen is flat.  Musculoskeletal:        General: Normal range of motion.     Cervical back: Normal range of motion.  Skin:    General: Skin is warm and dry.  Neurological:     General: No focal deficit present.     Mental Status: She is alert and oriented to person, place, and time.     GCS: GCS eye subscore is 4. GCS verbal subscore is 5. GCS motor subscore is 6.     Cranial Nerves: Cranial nerves are intact.  Motor: Tremor present. No weakness, abnormal muscle tone or pronator drift.     Coordination:  Coordination is intact.     Gait: Gait is intact.  Psychiatric:        Mood and Affect: Mood normal.      UC Treatments / Results  Labs (all labs ordered are listed, but only abnormal results are displayed) Labs Reviewed - No data to display  EKG   Radiology No results found.  Procedures Procedures (including critical care time)  Medications Ordered in UC Medications - No data to display  Initial Impression / Assessment and Plan / UC Course  I have reviewed the triage vital signs and the nursing notes.  Pertinent labs & imaging results that were available during my care of the patient were reviewed by me and considered in my medical decision making (see chart for details).     Assessment negative for red flags or concerns including CVA or intracranial abnormality.  This is likely an MS flare and prescribed high-dose prednisone for treatment.  As this is her first flare it was recommended that she may want to call and speak with her neurologist prior to starting prednisone dose.  It was also recommended that she follow-up with neurologist as soon as possible.  Strict ED follow-up for any worsening symptoms.   Final Clinical Impressions(s) / UC Diagnoses   Final diagnoses:  Multiple sclerosis [facility])     Discharge Instructions     Call your neurologist and make an appointment with them for as soon as possible.   Take the prednisone daily as prescribed.   Return or go to the Emergency Department if symptoms worsen or do not improve in the next few days.       ED Prescriptions    Medication Sig Dispense Auth. Provider   predniSONE (DELTASONE) 50 MG tablet Take 25 tablets (1,250 mg total) by mouth daily for 7 days. 175 tablet Ivette Loyal, NP     PDMP not reviewed this encounter.   Ivette Loyal, NP 05/16/20 1145

## 2020-05-16 NOTE — ED Triage Notes (Signed)
Pt in with c/o slurred speech, gait imbalance, and left hand tremor that started yesterday.  Pt states she thinks this is a MS flare up and she was not able to get in to see her neurologist today  Denies any vision changes, no drift noted

## 2020-05-17 ENCOUNTER — Encounter: Payer: Self-pay | Admitting: Neurology

## 2020-05-17 ENCOUNTER — Ambulatory Visit: Payer: BLUE CROSS/BLUE SHIELD | Admitting: Neurology

## 2020-05-17 VITALS — BP 106/66 | HR 71 | Ht 63.0 in | Wt 155.0 lb

## 2020-05-17 DIAGNOSIS — R269 Unspecified abnormalities of gait and mobility: Secondary | ICD-10-CM | POA: Diagnosis not present

## 2020-05-17 DIAGNOSIS — Z79899 Other long term (current) drug therapy: Secondary | ICD-10-CM

## 2020-05-17 DIAGNOSIS — R270 Ataxia, unspecified: Secondary | ICD-10-CM | POA: Diagnosis not present

## 2020-05-17 DIAGNOSIS — F419 Anxiety disorder, unspecified: Secondary | ICD-10-CM

## 2020-05-17 DIAGNOSIS — G35 Multiple sclerosis: Secondary | ICD-10-CM | POA: Diagnosis not present

## 2020-05-17 NOTE — Progress Notes (Signed)
[facility]  PATIENT: Abigail Lewis DOB: Jun 24, 1990  REFERRING DOCTOR OR PCP: Margie Ege (hospitalist) ; [facility] (PCP) SOURCE: Notes from recent emergency room and hospital stay and neurology, imaging and lab reports, MRI images personally reviewed.  _________________________________   HISTORICAL  CHIEF COMPLAINT:  Chief Complaint  Patient presents with  . Follow-up    RM 12, alone. Here for possible MS exacerbation. Having weakness in legs, slurred speech, lightheadedness/dizziness. Went to urgent care yesterday who prescribed her oral prednisone to take. She has not started this yet, wanted to speak with Dr. Epimenio Foot first. She is feeling some better. She took gabapentin yesterday (4 total) which helped. She is also asking for restriction note/letter to detail need for walker so she can bring it to work.     HISTORY OF PRESENT ILLNESS:  Abigail Lewis is a 30 y.o. woman with relapsing remitting MS.  Update 05/17/20 She is concerned about an exacerbation that started on Sunday 05/15/2020.    She reported weakness in her legs, increased tingling in her legs, tremors in her hands, slurred speech and also notes lightheadedness and dizziness.   Yesterday, she went to the UC and was prescribed oral prednisone (has not started).  We discussed taking 600 mg daily x 3 days.   She feels a little better compared to yesterday and Sunday.   She does note being more hot in the home this weekend.    She is on Ocrevus.  She tolerates it well.     She is walking better and can now go two miles without any assistive device.   She hasn't tried jogging yet.     She notes that her legs seem stronger since starting dalfampridine.     She continues to note a tremor in the left hand.  Metoprolol may have helped some.  However, with her current symptoms the tremor was worse.  Bladder is better on Vesicare but she had one episode of incontinence recently.   Vision is fine.   Recent eye appt showed no evidence of ON.  She notes pain in her left leg, mostly in the muscles of the left thigh.      She notes some fatigue. Sleep is variable. She sometimes has a mental fog, helped by modafinil. She notes some anxiety, worse this week,  but no significant depression.  She would like a note to say she can be out this week and restriction that she can bring her walker to work (she works for [employer] at the [employer]  MRI images are unusual.   She presented with  a longitudinal lesion on cervical spine MRI.  However, anti-NMO and anti-MOG were both negative. She had multiple other more classic appearing spine and brain MS lesions including some of the brain that enhanced.   Additionally she had oligoclonal bands in the CSF.  Therefore, MS is more likely.   Since seronegative NMOSD not ruled out, we opted for a B cell depleter as therapy.  She did the covid 19 vaccination and booster.   MS history:  In 2018 she noted that her legs would get numb and achy.   Symptoms fluctuated but persisted through 2019 and 2020.  She saw Dr. Allena Katz at [facility] neurology in December 2019 and again for an EMG October 2020 which was normal.  In August 2020 she began to experience lightheadedness with dizziness but not vertigo.  In October, she was noting more issues with her legs experiencing weakness  and clumsiness.    She had no falls but started to use a walker due to poor gait.    Symptoms progressed over the rest of 2020.  In January, her left hand started to have a tremor.    In early February 2021, she was unable to walk and needed to crawl down the stairs.   Her voice was slurred.  She had mild right visual changes.   She went to the [facility] ED and MRI's of the brain and cervical spine were concerning for a demyelinating disorder.   She received 5 days of IV Solu-Medrol with some improvement. She continued to improve over the next few months.  Laboratory tests: While in the hospital, she had a  lumbar puncture showing 5 oligoclonal bands and elevated IgG index .   Labwork showed positive ANA with elevated chromatin Ab and negative SSA/SSB initially and then milldy increased SSA 2 days later, negative SPEP/IEF, negative anti-NMO.   She is HIV negative.   We subsequently checked the [facility] anti-NMO and MOG and they were negative.    Imaging:  MRIs of the brain, cervical spine and thoracic spine performed between February 13 and March 09, 2019.  The MRI of the brain shows multiple T2/FLAIR hyperintense foci in the cerebellum, right middle cerebellar peduncle, brainstem including periaqueductal gray and hemispheres.  Some of the foci enhanced after contrast.  Additionally, there appeared to be enhancement within the left optic nerve MRI of the cervical spine showed a longitudinal lesion from C1-C2 throughC4 and patchy lesions elsewhere in the lower cervical and upper thoracic spinal cord.  MRI of the thoracic spine showed patchy lesions throughout the spinal cord.  There was no abnormal enhancement in the spinal cord.  MRI brain 10/20/2019 shows multiple T2/FLAIR hyperintense foci in the cerebellum, brainstem, thalamus and hemispheres in a pattern and configuration consistent with chronic demyelinating plaque associated with multiple sclerosis.  None of the foci enhances or appears to be acute.  There appears to be 1 nonenhancing focus in the cerebellum that was not present on the 03/07/2019 MRI.  All the foci that enhanced on the earlier brain MRI no longer do so.      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  MRI cervical spine 10/20/2019 shows Multiple T2 hyperintense foci within the brainstem, cerebellum and spinal cord in a pattern and configuration consistent with chronic demyelinating plaque associated with multiple sclerosis.  None of the foci appear to be acute.  They did not enhance.  The spinal cord lesions were all present on the 03/07/2019 MRI.    REVIEW OF  SYSTEMS: Constitutional: No fevers, chills, sweats, or change in appetite Eyes: No visual changes, double vision, eye pain Ear, nose and throat: No hearing loss, ear pain, nasal congestion, sore throat Cardiovascular: No chest pain, palpitations Respiratory: No shortness of breath at rest or with exertion.   No wheezes GastrointestinaI: No nausea, vomiting, diarrhea, abdominal pain, fecal incontinence Genitourinary: No dysuria, urinary retention or frequency.  No nocturia. Musculoskeletal: No neck pain, back pain Integumentary: No rash, pruritus, skin lesions Neurological: as above Psychiatric: She has had anxiety and depression. Endocrine: No palpitations, diaphoresis, change in appetite, change in weigh or increased thirst Hematologic/Lymphatic: No anemia, purpura, petechiae. Allergic/Immunologic: No itchy/runny eyes, nasal congestion, recent allergic reactions, rashes  ALLERGIES: Allergies  Allergen Reactions  . Penicillins Swelling    HOME MEDICATIONS:  Current Outpatient Medications:  .  dalfampridine 10 MG TB12, Take 1 tablet (10 mg  total) by mouth in the morning and at bedtime., Disp: 180 tablet, Rfl: 2 .  gabapentin (NEURONTIN) 300 MG capsule, One po qAm, one po qPM and two po qHS, Disp: 120 capsule, Rfl: 11 .  metoprolol succinate (TOPROL XL) 25 MG 24 hr tablet, Take 1 tablet (25 mg total) by mouth daily., Disp: 90 tablet, Rfl: 4 .  modafinil (PROVIGIL) 200 MG tablet, Take 1 tablet (200 mg total) by mouth every morning., Disp: 30 tablet, Rfl: 5 .  ocrelizumab (OCREVUS) 300 MG/10ML injection, Inject 600 mg into the vein every 6 (six) months. [facility] Intrafusion., Disp: , Rfl:  .  pravastatin (PRAVACHOL) 20 MG tablet, Take 20 mg by mouth daily., Disp: , Rfl:  .  traZODone (DESYREL) 50 MG tablet, TAKE 1 TABLET BY MOUTH AT BEDTIME AS NEEDED FOR SLEEP (Patient taking differently: Take 50 mg by mouth at bedtime as needed for sleep.), Disp: 30 tablet, Rfl: 0 .  VITAMIN D PO, Take  5,000 Units by mouth daily., Disp: , Rfl:  .  Vitamin D, Ergocalciferol, (DRISDOL) 1.25 MG (50000 UNIT) CAPS capsule, Take 1 capsule by mouth once a week., Disp: , Rfl:  .  predniSONE (DELTASONE) 50 MG tablet, Take 25 tablets (1,250 mg total) by mouth daily for 7 days. (Patient not taking: Reported on 05/17/2020), Disp: 175 tablet, Rfl: 0 .  sertraline (ZOLOFT) 50 MG tablet, Take 1 tablet (50 mg total) by mouth daily., Disp: 30 tablet, Rfl: 0  PAST MEDICAL HISTORY: Past Medical History:  Diagnosis Date  . ANA positive 02/13/2019  . Anxiety   . Depression   . Hyperlipidemia 12/03/2018   LDL=155  . Multiple sclerosis, relapsing-remitting (HCC)     PAST SURGICAL HISTORY: Past Surgical History:  Procedure Laterality Date  . NO PAST SURGERIES    . TOOTH EXTRACTION      FAMILY HISTORY: Family History  Problem Relation Age of Onset  . Stroke Mother   . Diabetes Father   . Healthy Sister   . Autism Brother     SOCIAL HISTORY:  Social History   Socioeconomic History  . Marital status: Single    Spouse name: Not on file  . Number of children: 0  . Years of education: 73  . Highest education level: Not on file  Occupational History  . Occupation: [employer], post office  Tobacco Use  . Smoking status: Never Smoker  . Smokeless tobacco: Never Used  Vaping Use  . Vaping Use: Never used  Substance and Sexual Activity  . Alcohol use: Yes    Comment: wine sometimes   . Drug use: No  . Sexual activity: Yes    Birth control/protection: Pill  Other Topics Concern  . Not on file  Social History Narrative   Right handed    Caffeine use: sometimes   She works as a Office manager and part-time as Lawyer.   She lives alone.  No children.    Social Determinants of Health   Financial Resource Strain: Not on file  Food Insecurity: Not on file  Transportation Needs: Not on file  Physical Activity: Not on file  Stress: Not on file  Social Connections: Not on file  Intimate Partner  Violence: Not on file     PHYSICAL EXAM  Vitals:   05/17/20 0749  BP: 106/66  Pulse: 71  Weight: 155 lb (70.3 kg)  Height: 5\' 3"  (1.6 m)    Body mass index is 27.46 kg/m.  No exam data present  General: The patient is well-developed and well-nourished and in no acute distress  HEENT:  Head is /AT.  Sclera are anicteric.     Skin: Extremities are without rash or  edema.   Neurologic Exam  Mental status: The patient is alert and oriented x 3 at the time of the examination. The patient has apparent normal recent and remote memory, with an apparently normal attention span and concentration ability.   Speech is normal.  Cranial nerves: Extraocular movements are full.    Color vision is symmetric.  Facial strength is normal.  Trapezius and sternocleidomastoid strength is normal. No dysarthria is noted.    No obvious hearing deficits are noted.  Motor:  Muscle bulk is normal.   Tone is normal. Strength is  5 / 5 in all 4 extremities.   Sensory: She had intact sensation to touch, temperature and vibration.  Coordination: She has reduced left finger-nose-finger and reduced left heel-to-shin..   Gait and station: Station is normal. Currently, her gait is fairly normal but the tandem gait is mildly wide. Romberg is negative now.   Reflexes: Deep tendon reflexes are symmetric and normal in arms and increased at the legs.  There is no ankle clonus.[name] Kitchen.          DIAGNOSTIC DATA (LABS, IMAGING, TESTING) - I reviewed patient records, labs, notes, testing and imaging myself where available.  Lab Results  Component Value Date   WBC 3.4 10/13/2019   HGB 11.7 10/13/2019   HCT 33.8 (L) 10/13/2019   MCV 94 10/13/2019   PLT 243 10/13/2019      Component Value Date/Time   NA 139 03/12/2019 0219   K 3.8 03/12/2019 0219   CL 106 03/12/2019 0219   CO2 24 03/12/2019 0219   GLUCOSE 110 (H) 03/12/2019 0219   BUN 13 03/12/2019 0219   CREATININE 0.72 03/12/2019 0219   CALCIUM 9.3  03/12/2019 0219   PROT 6.8 10/13/2019 1349   ALBUMIN 4.6 10/13/2019 1349   AST 13 10/13/2019 1349   ALT 15 10/13/2019 1349   ALKPHOS 44 10/13/2019 1349   BILITOT 0.3 10/13/2019 1349   GFRNONAA >60 03/12/2019 0219   GFRAA >60 03/12/2019 0219   Lab Results  Component Value Date   CHOL 207 (H) 07/12/2016   HDL 61 07/12/2016   LDLCALC 137 (H) 07/12/2016   TRIG 43 07/12/2016   CHOLHDL 3.4 07/12/2016   Lab Results  Component Value Date   HGBA1C 5.1 03/10/2019   Lab Results  Component Value Date   VITAMINB12 894 03/08/2019   Lab Results  Component Value Date   TSH 2.212 03/08/2019       ASSESSMENT AND PLAN  Multiple sclerosis (HCC) - Plan: Ambulatory referral to Physical Therapy  Ataxia - Plan: Ambulatory referral to Physical Therapy  Gait abnormality - Plan: Ambulatory referral to Physical Therapy  High risk medication use  Anxiety   1.  Exacerbation versus pseudo exacerbation due to increased heat.  I will have her do 600 mg prednisone x3 days to try to help with the symptoms.  She will continue Ocrevus.  Later this year,  check MRI of the brain and cervical spine to determine if there is any subclinical progression. If this is occurring we would need to consider a different disease modifying therapy.  2.    Stay active and exercise as tolerated.   3.    Continue gabapentin to 300-300-600 and continue trazodone at night.  Continue low-dose metoprolol for tremor (high dose  because hypertension)  4.   Notes for work to allow her to use a walker on days she feels gait or balance are doing worse.  Additional note to write her out from yesterday until next Monday.   5.   Return in 6 months or sooner if there are new or worsening neurologic symptoms.    Kiara Mcdowell A. Epimenio Foot, MD, [facility] 05/17/2020, 9:31 AM Certified in Neurology, Clinical Neurophysiology, Sleep Medicine and Neuroimaging  [facility] [address] [place], [place] [address] [phone]

## 2020-05-18 ENCOUNTER — Ambulatory Visit: Payer: BLUE CROSS/BLUE SHIELD | Admitting: Physical Therapy

## 2020-05-18 ENCOUNTER — Telehealth (HOSPITAL_COMMUNITY): Payer: Self-pay | Admitting: Emergency Medicine

## 2020-05-18 MED ORDER — PREDNISONE 50 MG PO TABS: 600.0000 mg | ORAL_TABLET | Freq: Every day | ORAL | 0 refills | Status: AC

## 2020-05-18 NOTE — Telephone Encounter (Signed)
Patient reports prednisone dose was too high.  Per neurology note from yesterday, they recommend prednisone 600mg  x3 days.  New prescription sent.

## 2020-05-19 ENCOUNTER — Telehealth: Payer: Self-pay | Admitting: Neurology

## 2020-05-19 NOTE — Telephone Encounter (Signed)
Pt has called to inform Dr Epimenio Foot that she is still without any steroids and is in the middle of a flare up.  Please call

## 2020-05-19 NOTE — Telephone Encounter (Addendum)
Called pt back. She was not able to fill prescription at the pharmacy. I spoke with Dr. Epimenio Foot who authorized 12 tablets (600mg ) po daily x3 days. I called [facility] back at [phone]. Spoke with Abby who transferred me to pharmacist, [phone]. I provided authorization/VO for them to fill prescription.  I called pt back to let her know. She verbalized understanding and appreciation.

## 2020-05-23 NOTE — Telephone Encounter (Signed)
Pt called back stating that she was not able to fill her Rx all weekend for the Prednisone 50mg . Pharmacy keeps informing her that they have not received clarification on the instructions. Pt would like the RN to call the pharmacy to clarify the instructions then if she could get a call to let her know she can pick up the medication.

## 2020-05-23 NOTE — Telephone Encounter (Addendum)
Called pharmacy back at [phone]. Spoke w/ Shawna Orleans. They have no record of me speaking w/ Victorino Dike (she was a Sales promotion account executive). Advised I spoke w/ her 05/19/20 around 945am and gave clarification needed for them to fill rx prednisone. She apologized and will get script ready for pt. Advised I will call the pt to let her know.  I called pt and advised they are getting script ready for her. Asked she call me back if she has any more issues picking up script. She verbalized understanding.

## 2020-05-31 ENCOUNTER — Encounter: Payer: Self-pay | Admitting: Physical Therapy

## 2020-05-31 ENCOUNTER — Ambulatory Visit: Payer: BLUE CROSS/BLUE SHIELD | Attending: Neurology | Admitting: Physical Therapy

## 2020-05-31 ENCOUNTER — Other Ambulatory Visit: Payer: Self-pay

## 2020-05-31 DIAGNOSIS — R2681 Unsteadiness on feet: Secondary | ICD-10-CM | POA: Insufficient documentation

## 2020-05-31 DIAGNOSIS — R2689 Other abnormalities of gait and mobility: Secondary | ICD-10-CM | POA: Insufficient documentation

## 2020-05-31 DIAGNOSIS — M6281 Muscle weakness (generalized): Secondary | ICD-10-CM | POA: Insufficient documentation

## 2020-06-01 ENCOUNTER — Telehealth: Payer: Self-pay | Admitting: Physical Therapy

## 2020-06-01 ENCOUNTER — Other Ambulatory Visit: Payer: Self-pay | Admitting: Neurology

## 2020-06-01 DIAGNOSIS — G35 Multiple sclerosis: Secondary | ICD-10-CM

## 2020-06-01 DIAGNOSIS — R270 Ataxia, unspecified: Secondary | ICD-10-CM

## 2020-06-01 NOTE — Therapy (Signed)
[facility] [address] [place], [place], [address] Phone: [phone]   Fax:  [phone]  Physical Therapy Evaluation  Patient Details  Name: Abigail Lewis MRN: [id number] Date of Birth: 14-Feb-1990 Referring Provider (PT): Dr. Epimenio Foot   Encounter Date: 05/31/2020   PT End of Session - 06/01/20 0928    Visit Number 1    Number of Visits 7    Authorization Type BCBS - $60 co pay    PT Start Time 1703    PT Stop Time 1748    PT Time Calculation (min) 45 min    Activity Tolerance Patient tolerated treatment well    Behavior During Therapy [facility] for tasks assessed/performed           Past Medical History:  Diagnosis Date  . ANA positive 02/13/2019  . Anxiety   . Depression   . Hyperlipidemia 12/03/2018   LDL=155  . Multiple sclerosis, relapsing-remitting [facility])     Past Surgical History:  Procedure Laterality Date  . NO PAST SURGERIES    . TOOTH EXTRACTION      There were no vitals filed for this visit.    Subjective Assessment - 05/31/20 1706    Subjective Reports an exacerbation that started on Sunday 05/15/2020 - had weakness/tingling, tremors in her hands, slurred speech and lightheadedness/dizziness. Feeling better now. Reports taking dalfampridine has been helping a lot. Feels like she does not have her full strength back, reports balancing on one leg is hard like when she is putting on her heels. L side is the weaker side. Can't open jars or smaller objects. Sometimes can only walk for about 15 minutes before she gets tired. When she bends down at times sometimes she will get dizzy/lightheaded.    Pertinent History RRMS (diagnosed 2021)    Limitations Walking    How long can you walk comfortably? approx. 15 minutes    Patient Stated Goals wants to be able to walk in heels again, be able to get her full strength back, be more independent.    Currently in Pain? No/denies              [facility] PT  Assessment - 05/31/20 1712      Assessment   Medical Diagnosis MS    Referring Provider (PT) Dr. Epimenio Foot    Onset Date/Surgical Date 05/17/20   diagnosed with MS feb 2021   Hand Dominance Right      Balance Screen   Has the patient fallen in the past 6 months No    Has the patient had a decrease in activity level because of a fear of falling?  No    Is the patient reluctant to leave their home because of a fear of falling?  No      Home Environment   Living Environment Private residence    Living Arrangements Alone    Type of Home House    Home Access Level entry    Home Layout Two level    Alternate Level Stairs-Rails Left    Home Equipment Other (comment)   rollator   Additional Comments used her rollator during MS exacerbation      Prior Function   Level of Independence Independent    Vocation Full time employment    Vocation Requirements HR at the [employer]    Leisure watch youtube, likes writing comedy jokes      Sensation   Light Touch Appears Intact    Additional Comments  no N/T right now, but does report having it sometimes on anterior shin      Coordination   Gross Motor Movements are Fluid and Coordinated No    Fine Motor Movements are Fluid and Coordinated --   [facility]   Heel Shin Test slower to perform with LLE      ROM / Strength   AROM / PROM / Strength Strength      Strength   Strength Assessment Site Hip;Knee;Ankle    Right/Left Hip Right;Left    Right Hip Flexion 4-/5    Left Hip Flexion 4-/5    Right/Left Knee Right;Left    Right Knee Flexion 5/5    Right Knee Extension 5/5    Left Knee Flexion 5/5    Left Knee Extension 4/5    Right/Left Ankle Right;Left    Right Ankle Dorsiflexion 5/5    Left Ankle Dorsiflexion 5/5      Transfers   Transfers Sit to Stand;Stand to Sit    Sit to Stand 5: Supervision    Five time sit to stand comments  32.25 seconds no UE support from standard height chair    Stand to Sit 5: Supervision    Comments 30 second  chair stand: 4 sit <> stands no UE support      Ambulation/Gait   Ambulation/Gait Yes    Ambulation/Gait Assistance 5: Supervision    Ambulation/Gait Assistance Details foot flat contact with gait    Assistive device None    Gait Pattern Step-through pattern;Decreased arm swing - left;Decreased arm swing - right;Decreased dorsiflexion - left;Decreased dorsiflexion - right;Poor foot clearance - left;Poor foot clearance - right    Ambulation Surface Level;Indoor    Gait velocity 11.84 seconds = 2.77 ft/sec    Gait velocity - backwards 42.5 seconds (in 20 ft) = .47 ft/sec      High Level Balance   High Level Balance Comments mCTSIB: conditions 1-4 = 30 seconds, incr postrual sway with conditions 2-4      Functional Gait  Assessment   Gait assessed  Yes    Gait Level Surface Walks 20 ft, slow speed, abnormal gait pattern, evidence for imbalance or deviates 10-15 in outside of the 12 in walkway width. Requires more than 7 sec to ambulate 20 ft.   7.34 seconds   Change in Gait Speed Able to smoothly change walking speed without loss of balance or gait deviation. Deviate no more than 6 in outside of the 12 in walkway width.    Gait with Horizontal Head Turns Performs head turns smoothly with slight change in gait velocity (eg, minor disruption to smooth gait path), deviates 6-10 in outside 12 in walkway width, or uses an assistive device.    Gait with Vertical Head Turns Performs head turns with no change in gait. Deviates no more than 6 in outside 12 in walkway width.    Gait and Pivot Turn Pivot turns safely within 3 sec and stops quickly with no loss of balance.    Step Over Obstacle Is able to step over one shoe box (4.5 in total height) without changing gait speed. No evidence of imbalance.    Gait with Narrow Base of Support Ambulates 7-9 steps.    Gait with Eyes Closed Walks 20 ft, uses assistive device, slower speed, mild gait deviations, deviates 6-10 in outside 12 in walkway width.  Ambulates 20 ft in less than 9 sec but greater than 7 sec.   8.66   Ambulating Backwards Walks  20 ft, slow speed, abnormal gait pattern, evidence for imbalance, deviates 10-15 in outside 12 in walkway width.   42.5 seconds   Steps Alternating feet, must use rail.    Total Score 21    FGA comment: 21/30 = medium fall risk                      Objective measurements completed on examination: See above findings.               PT Education - 06/01/20 4492    Education Details clinical findings, POC, what therapy will address.    Person(s) Educated Patient    Methods Explanation    Comprehension Verbalized understanding            PT Short Term Goals - 06/01/20 0931      PT SHORT TERM GOAL #1   Title Pt will be independent with initial HEP in order to build upon functional gains made in therapy. ALL STGS DUE 07/13/20    Time 6    Period Weeks    Status New    Target Date 07/13/20      PT SHORT TERM GOAL #2   Title Pt will improve FGA score to at least a 23/30 in order to demo decr fall risk.    Baseline 21/30    Time 6    Period Weeks    Status New      PT SHORT TERM GOAL #3   Title Pt will perform 5x sit <> stand to 27 seconds or less without UE support in order to demo improved BLE strength.    Baseline 32.25 seconds    Time 6    Period Weeks    Status New      PT SHORT TERM GOAL #4   Title Pt will improve gait speed to at least 3.0 ft/sec in order to demo improved community mobility.    Baseline 2.77 ft/sec no AD    Time 6    Period Weeks    Status New             PT Long Term Goals - 06/01/20 0933      PT LONG TERM GOAL #1   Title Pt will be independent with final HEP in order to build upon functional gains made in therapy. ALL LTGS DUE 08/24/20    Time 12    Period Weeks    Status New    Target Date 08/24/20      PT LONG TERM GOAL #2   Title Pt will improve 30 second chair stand to at least 7 sit <> stands in order to demo improved  BLE strength/endurance.    Baseline 4 sit <> Stands    Time 12    Period Weeks    Status New      PT LONG TERM GOAL #3   Title Pt will improve FGA score to at least a 26/30 in order to demo decr fall risk.    Baseline 21/30    Time 12    Period Weeks    Status New      PT LONG TERM GOAL #4   Title Pt will ambulate at least 1,000' outdoors with mod I over unlevel surfaces in order to demo improved community mobility.    Time 12    Period Weeks    Status New      PT LONG TERM GOAL #5   Title Pt will  improve gait speed to at least 3.3 ft/sec in order to demo improved community mobility.    Baseline 2.77 ft/sec    Time 12    Period Weeks    Status New                  Plan - 06/01/20 0939    Clinical Impression Statement Patient is a 30 year old female referred to Neuro OPPT for gait abnormality/decr strength s/p MS exacerbation at end of April.  Pt's PMH is significant for: RRMS (diagnosed Feb 21).  The following deficits were present during the exam: impaired balance, decr strength, impaired coordination, gait abnormalities. Based on FGA, pt is a medium risk for falls. POC will be every other week for 3 months due to pt's copay. Pt would benefit from skilled PT to address these impairments and functional limitations to maximize functional mobility independence    Personal Factors and Comorbidities Time since onset of injury/illness/exacerbation;Past/Current Experience;Comorbidity 1    Comorbidities RRMS (diagnosed 02/2019)    Examination-Activity Limitations Stairs;Transfers;Locomotion Level    Examination-Participation Restrictions Community Activity    Stability/Clinical Decision Making Stable/Uncomplicated    Rehab Potential Good    PT Frequency Biweekly   every other week   PT Duration --   for 3 months   PT Treatment/Interventions ADLs/Self Care Home Management;Aquatic Therapy;Electrical Stimulation;Gait training;DME Instruction;Stair training;Functional mobility  training;Therapeutic activities;Balance training;Neuromuscular re-education;Therapeutic exercise;Patient/family education;Vestibular    PT Next Visit Plan initial HEP - strengthening, balance - head motions, eyes closed, retro gait, narrow BOS, SLS. further assess hip strength. look at SLS.    Recommended Other Services OT referral    Consulted and Agree with Plan of Care Patient           Patient will benefit from skilled therapeutic intervention in order to improve the following deficits and impairments:  Abnormal gait,Decreased balance,Decreased coordination,Decreased strength,Dizziness,Difficulty walking  Visit Diagnosis: Unsteadiness on feet  Muscle weakness (generalized)  Other abnormalities of gait and mobility     Problem List Patient Active Problem List   Diagnosis Date Noted  . Vitamin D deficiency 04/26/2020  . Encounter for monitoring immunomodulating therapy 04/26/2020  . Multiple sclerosis (HCC) 05/25/2019  . Demyelinating disease of central nervous system (HCC) 03/23/2019  . High risk medication use 03/23/2019  . Gait abnormality 03/23/2019  . Ataxia 03/23/2019  . Urinary dysfunction 03/23/2019  . Numbness and tingling 03/08/2019  . Hyperlipidemia 03/08/2019  . Anxiety 03/08/2019  . Tremor 03/07/2019  . History of miscarriage 07/12/2016    Drake Leachhloe N Tarsha Blando, PT, DPT  06/01/2020, 10:05 AM  [facility] [address] [place], [place], [address] Phone: [phone]   Fax:  [phone]  Name: Abigail Lewis MRN: [id number] Date of Birth: 11/27/1990

## 2020-06-01 NOTE — Telephone Encounter (Addendum)
Dr. Epimenio Foot, Sanjuan Dame was evaluated by PT on 05/31/20.  The patient would benefit from an OT evaluation for difficulty with UE coordination/fine motor tasks due to MS.   If you agree, please place an order in [facility] workque in [facility] or fax the order to [phone]. Thank you, Sherlie Ban, PT, DPT 06/01/20 9:23 AM    [facility] [address] [place], [place]  [address] Phone:  [phone] Fax:  [phone]

## 2020-06-01 NOTE — Telephone Encounter (Signed)
Sorry Dr. Anne Hahn!   I apologize for sending this to you. I accidentally put in the wrong physician for the referral.  I have corrected this.  Thanks, Sherlie Ban, PT, DPT 06/01/20 9:25 AM

## 2020-06-07 ENCOUNTER — Ambulatory Visit: Payer: BLUE CROSS/BLUE SHIELD | Admitting: Physical Therapy

## 2020-06-08 ENCOUNTER — Encounter: Payer: Self-pay | Admitting: *Deleted

## 2020-06-08 ENCOUNTER — Ambulatory Visit: Payer: BLUE CROSS/BLUE SHIELD | Attending: Obstetrics and Gynecology | Admitting: *Deleted

## 2020-06-08 ENCOUNTER — Other Ambulatory Visit: Payer: Self-pay

## 2020-06-08 ENCOUNTER — Ambulatory Visit (HOSPITAL_BASED_OUTPATIENT_CLINIC_OR_DEPARTMENT_OTHER): Payer: BLUE CROSS/BLUE SHIELD | Admitting: Obstetrics and Gynecology

## 2020-06-08 DIAGNOSIS — Z3169 Encounter for other general counseling and advice on procreation: Secondary | ICD-10-CM | POA: Diagnosis not present

## 2020-06-08 DIAGNOSIS — Z79899 Other long term (current) drug therapy: Secondary | ICD-10-CM | POA: Insufficient documentation

## 2020-06-08 DIAGNOSIS — Z880 Allergy status to penicillin: Secondary | ICD-10-CM | POA: Insufficient documentation

## 2020-06-08 DIAGNOSIS — Z823 Family history of stroke: Secondary | ICD-10-CM | POA: Diagnosis not present

## 2020-06-08 DIAGNOSIS — Z833 Family history of diabetes mellitus: Secondary | ICD-10-CM | POA: Insufficient documentation

## 2020-06-08 DIAGNOSIS — G35 Multiple sclerosis: Secondary | ICD-10-CM | POA: Diagnosis not present

## 2020-06-08 DIAGNOSIS — F419 Anxiety disorder, unspecified: Secondary | ICD-10-CM | POA: Insufficient documentation

## 2020-06-08 NOTE — Progress Notes (Signed)
Maternal-Fetal Medicine  Name: Abigail Lewis DOB: [birth year] MRN: [id number] Referring Provider: Richardean Chimera, MD  I had the pleasure of seeing Ms. Abigail Lewis today at the [facility]. She is G2 P0020 and is here for preconception consultation. She has a diagnosis of multiple sclerosis.  Multiple sclerosis was diagnosed in February 2021, but his symptoms predated the diagnosis by 3 years.  Initial symptoms in 2018 where tingling in the legs, left hand tremor, urinary incontinence and lightheadedness.  In February 2021 the symptoms worsened, and the patient was unable to walk ("crawled downstairs") and she had slurred speech.  She was admitted at [facility] for 5 days.  MRI showed extensive demyelination and the diagnosis was established based on her clinical and MRI findings. She is being followed by her neurologist, Dr. Despina Arias with whom she had a consultation on 05/18/2019.  She was told that her recent exacerbation was because of " overheating" (weather).  Patient reports improvement in her symptoms and can walk without difficulty. She takes Ocrevus (ocrelizumab) infusions every 6 months.  She also takes gabapentin and metoprolol (and tremors).  Patient has anxiety disorder and takes Zoloft and trazodone.    ROS: No severe headache or visual disturbances or neck swelling or chest pain or palpitations.  No joint pains or severe abdominal pain.  No vaginal bleeding.  Patient reports having frequent urinary tract infections.  PMH: She does not have hypertension or diabetes or thyroid disorders. Past surgical history: Wisdom tooth removal.  Medications: Dalfampridine 10 mg twice daily, gabapentin 300 mg 3 times daily, metoprolol succinate 25 mg once daily, Zoloft 50 mg daily, trazodone 100 mg daily, vitamin D supplements, biotin, fish oil tablets, pravastatin 20 mg, Ocrevus infusion every 6 months.  Allergies: Penicillin (swelling of face). Social  history: Denies tobacco or drug or alcohol use.  She lives with a partner who is [place] and he is in good health. Family history: Father has diabetes and mother had mini stroke but both are alive.  Her siblings are in good health.  Obstetric history: 2 early spontaneous miscarriages (2009 and 2015). GYN history: Her menstrual cycles are regular.  No history of abnormal Pap smears or cervical surgeries.  No history of breast disease.   Our concerns include: Multiple sclerosis in pregnancy -Patient has a regular follow-up with her neurologist and is comfortable with the diagnosis of multiple sclerosis.  -Pregnancy seems to confer some protection in reducing relapse rates (PRIMS study) and there is a significant reduction in relapse in the third trimester. About 25% of women have postpartum relapses are more common.  -Pregnancy does NOT influence long-term course (disability outcomes) of MS. Pregnancy also does not affect the type or severity of exacerbation.   -MS does not increase adverse pregnancy outcomes including preterm delivery, preeclampsia, or fetal growth restriction. Patient does not have bladder symptoms now that are likely to be exacerbated in pregnancy in some women.  -Pregnancy is not considered high risk if MS is stable and if she is not taking disease-modifying drugs. Our patient is likely to continue disease-modifying drug. -Vitamin D supplements are recommended.  -MRI in pregnancy is not contraindicated. Gadolinium dye is avoided but may be given if MRI is necessary for diagnosis.  -Prenatal and obstetric management is not influenced by MS and routine care should continue. Vaginal delivery is the goal and cesarean section should be performed only for obstetric indications.  -Epidural analgesia may be safely given. In severe cases with  spasticity, epidural analgesia may be helpful. Anesthesiologist may be informed in advance if induction of labor is  planned.  Treatment of MS -Disease-modifying drugs are the mainstay of treatment of MS. Copaxone (glatiramer acetate or GA) has been approved for use in pregnant women (category B) and no increased adverse effects are reported.  Other drugs include Interferon-beta (IFN-B) and natalizumab (Tysabri). I have not discussed the drugs that are contraindicated in pregnancy and are of concern only in preconception counseling.  -In general, pregnant women should not be deprived of treatment because of pregnancy concerns. Falling relapse rates in pregnancy does not mean that long-term progression is in pause.   -IFN-B and GA can be given in pregnancy and no increased congenital malformations are reported. Benefits of breastfeeding while on treatment with these drugs seem to outweigh risks.  -Natalizumab (Tysabri) is considered in patients with severe MS. If considered for our patient, it can be continued in pregnancy and preferably stopped at 34 weeks for about 8 weeks (till after delivery). Self-limiting hematological abnormalities are reported in some newborns.  Methylprednisone can be safely given in pregnancy if severe exacerbation requires steroid treatment.  I encouraged the patient to continue follow-up with her neurologist to discuss MS and treatment. Although she will experience remission during pregnancy, treatment should NOT be deferred because of her pregnancy.  -No information is available on Ocrevus infusion in pregnancy. I gave the patient the contact number of pregnancy registry [phone]).   Reference on treatment: [place] Consensus on pregnancy with multiple sclerosis. Pract Neurol 2019;19:106-114).  Medications: Dalfampridine: It is a Oceanographer.  Not enough information is available on the use of this medication in pregnancy. Animal studies showed decreased offspring viability.   Gabapentin and metoprolol can be safely given in pregnancy.  Zoloft has not been shown to  increased birth defects. It is an SSRI agent. A small absolute increase in primary pulmonary hypertension has been observed. However, untreated depression can be associated with adverse maternal outcomes that can override the above-mentioned risk. Patient was counseled on the small risk for primary pulmonary hypertension.   Trazodone: It is an antidepressant that is chemically unrelated other major antidepressants. It is not associated with increased congenital malformations or other adverse outcomes in the newborn. It is excreted in small amounts in breast milk and appears compatible with breastfeeding.  Statins should be discontinued in pregnancy.   Preconception I recommended preconception folic acid (400 micrograms daily) to be taken at least one month before planned conception. It reduces the likelihood of fetal open-neural tube defects.  I discussed screening for fetal aneuploidies and ultrasound protocol. Recommend fetal growth assessments every 4 weeks if the patient is taking disease-modifying drugs.  Patient had questions about early pregnancy losses. She does not have thyroid disease or diabetes. I recommend pelvic ultrasound to rule out any intracavitary lesions.  I counseled the patient on the above and encouraged her to get pregnant when she is free of exacerbations for at least 3 months. Ideal disease-modifying drug should be chosen before attempting pregnancy and the patient will discuss with her neurologist.  Recommendations: -Continue follow-up with neurologist. -To discuss substituting copaxone or natalizumab before conception. -Transvaginal ultrasound to evaluate uterus and adnexa. -First trimester fetal anatomy at the [facility].  Thank you for consultation.  If you have any questions, please contact me at the [facility].  Consultation including face-to-face counseling 45 minutes.

## 2020-06-08 NOTE — Progress Notes (Signed)
BP 101/66, P 71

## 2020-06-21 ENCOUNTER — Ambulatory Visit: Payer: BLUE CROSS/BLUE SHIELD | Admitting: Physical Therapy

## 2020-06-21 ENCOUNTER — Encounter: Payer: Self-pay | Admitting: Physical Therapy

## 2020-06-21 ENCOUNTER — Other Ambulatory Visit: Payer: Self-pay

## 2020-06-21 VITALS — BP 95/69 | HR 84

## 2020-06-21 DIAGNOSIS — R2681 Unsteadiness on feet: Secondary | ICD-10-CM | POA: Diagnosis not present

## 2020-06-21 DIAGNOSIS — M6281 Muscle weakness (generalized): Secondary | ICD-10-CM

## 2020-06-21 DIAGNOSIS — R2689 Other abnormalities of gait and mobility: Secondary | ICD-10-CM

## 2020-06-21 NOTE — Patient Instructions (Signed)
Access Code: [email or web address] URL: [email or web address] Date: 06/21/2020 Prepared by: Sherlie Ban  Exercises Standing Marching - 1 x daily - 5 x weekly - 3 sets Backward Walking with Counter Support - 1 x daily - 5 x weekly - 3 sets Romberg Stance Eyes Closed on Foam Pad - 1 x daily - 5 x weekly - 3 sets - 25-30 hold Romberg Stance on Foam Pad with Head Rotation - 1 x daily - 5 x weekly - 2 sets - 10 reps Staggered Sit-to-Stand (Mirrored) - 1 x daily - 5 x weekly - 2 sets - 5 reps

## 2020-06-21 NOTE — Therapy (Addendum)
[facility] [address] [place], [place], [address] Phone: [phone]   Fax:  [phone]  Physical Therapy Treatment  Patient Details  Name: Abigail Lewis MRN: [id number] Date of Birth: Jun 01, 1990 Referring Provider (PT): Dr. Epimenio Foot   Encounter Date: 06/21/2020   PT End of Session - 06/21/20 1825    Visit Number 2    Number of Visits 7    Authorization Type BC[employer] - $60 co pay    PT Start Time 1741    PT Stop Time 1825    PT Time Calculation (min) 44 min    Activity Tolerance Patient tolerated treatment well    Behavior During Therapy [facility] for tasks assessed/performed           Past Medical History:  Diagnosis Date  . ANA positive 02/13/2019  . Anxiety   . Depression   . Hyperlipidemia 12/03/2018   LDL=155  . Multiple sclerosis, relapsing-remitting [facility])     Past Surgical History:  Procedure Laterality Date  . NO PAST SURGERIES    . TOOTH EXTRACTION      Vitals:   06/21/20 1746 06/21/20 1748  BP: (!) 88/61 95/69  Pulse: 74 84     Subjective Assessment - 06/21/20 1743    Subjective Having a migraine today. Slept wrong last night and reporting feeling ike her body feels sore.    Pertinent History RRMS (diagnosed 2021)    Limitations Walking    How long can you walk comfortably? approx. 15 minutes    Patient Stated Goals wants to be able to walk in heels again, be able to get her full strength back, be more independent.    Currently in Pain? Yes    Pain Score 6     Pain Location Back    Pain Orientation Lower    Pain Descriptors / Indicators Aching    Pain Type Acute pain    Aggravating Factors  started last night after sleeping on it wrong.    Pain Relieving Factors tried pain medication twice and it still didn't help              [facility] PT Assessment - 06/21/20 1823      Strength   Right Hip ABduction 3+/5    Left Hip ABduction 3+/5                        Access  Code: [email or web address] URL: [email or web address] Date: 06/21/2020 Prepared by: Sherlie Ban  Initiated HEP for balance/strengthening. See MedBridge for further details.   Exercises Standing Marching - 1 x daily - 5 x weekly - 3 sets Backward Walking with Counter Support - 1 x daily - 5 x weekly - 3 sets Romberg Stance Eyes Closed on Foam Pad - 1 x daily - 5 x weekly - 3 sets - 25-30 hold Romberg Stance on Foam Pad with Head Rotation - 1 x daily - 5 x weekly - 2 sets - 10 reps Staggered Sit-to-Stand (Mirrored) - 1 x daily - 5 x weekly - 2 sets - 5 reps  [facility] Adult PT Treatment/Exercise - 06/22/20 1306      Therapeutic Activites    Therapeutic Activities Other Therapeutic Activities    Other Therapeutic Activities pt having lower BP (reporting no lightheadedness or dizziness), pt with a migraine today but willing to participate in therapy. educated on importance of drinking plenty of water (pt reports only drinking 1 8 oz glass of water  today) and provided pt with water during session. discussed if migraine is getting worse - would want to reach out to PCP/pt's neurologist                  PT Education - 06/21/20 1825    Education Details initial HEP    Person(s) Educated Patient    Methods Explanation;Demonstration;Handout    Comprehension Verbalized understanding;Returned demonstration            PT Short Term Goals - 06/01/20 0931      PT SHORT TERM GOAL #1   Title Pt will be independent with initial HEP in order to build upon functional gains made in therapy. ALL STGS DUE 07/13/20    Time 6    Period Weeks    Status New    Target Date 07/13/20      PT SHORT TERM GOAL #2   Title Pt will improve FGA score to at least a 23/30 in order to demo decr fall risk.    Baseline 21/30    Time 6    Period Weeks    Status New      PT SHORT TERM GOAL #3   Title Pt will perform 5x sit <> stand to 27 seconds or less without UE support in order to demo improved BLE  strength.    Baseline 32.25 seconds    Time 6    Period Weeks    Status New      PT SHORT TERM GOAL #4   Title Pt will improve gait speed to at least 3.0 ft/sec in order to demo improved community mobility.    Baseline 2.77 ft/sec no AD    Time 6    Period Weeks    Status New             PT Long Term Goals - 06/01/20 0933      PT LONG TERM GOAL #1   Title Pt will be independent with final HEP in order to build upon functional gains made in therapy. ALL LTGS DUE 08/24/20    Time 12    Period Weeks    Status New    Target Date 08/24/20      PT LONG TERM GOAL #2   Title Pt will improve 30 second chair stand to at least 7 sit <> stands in order to demo improved BLE strength/endurance.    Baseline 4 sit <> Stands    Time 12    Period Weeks    Status New      PT LONG TERM GOAL #3   Title Pt will improve FGA score to at least a 26/30 in order to demo decr fall risk.    Baseline 21/30    Time 12    Period Weeks    Status New      PT LONG TERM GOAL #4   Title Pt will ambulate at least 1,000' outdoors with mod I over unlevel surfaces in order to demo improved community mobility.    Time 12    Period Weeks    Status New      PT LONG TERM GOAL #5   Title Pt will improve gait speed to at least 3.3 ft/sec in order to demo improved community mobility.    Baseline 2.77 ft/sec    Time 12    Period Weeks    Status New                 Plan -  06/22/20 1309    Clinical Impression Statement Today's skilled session focused on initiating HEP for standing balance and LLE strengthening. Pt reporting that she has been having a migraine, assessed BP in sitting/standing (see vitals) and was lower, but pt reporting no lightheadedness/dizziness and able to participate in session. Also assessed bilat  hip ABD strength, with pt with 3+/5 bilaterally. Will continue to progress towards LTGs.    Personal Factors and Comorbidities Time since onset of injury/illness/exacerbation;Past/Current  Experience;Comorbidity 1    Comorbidities RRMS (diagnosed 02/2019)    Examination-Activity Limitations Stairs;Transfers;Locomotion Level    Examination-Participation Restrictions Community Activity    Stability/Clinical Decision Making Stable/Uncomplicated    Rehab Potential Good    PT Frequency Biweekly   every other week   PT Duration --   for 3 months   PT Treatment/Interventions ADLs/Self Care Home Management;Aquatic Therapy;Electrical Stimulation;Gait training;DME Instruction;Stair training;Functional mobility training;Therapeutic activities;Balance training;Neuromuscular re-education;Therapeutic exercise;Patient/family education;Vestibular    PT Next Visit Plan how is HEP? proximal strengthening (may want to try tall kneel sometime) esp hip ABD, balance with EC and head motions, SLS, on compliant surfaces.    Consulted and Agree with Plan of Care Patient           Patient will benefit from skilled therapeutic intervention in order to improve the following deficits and impairments:  Abnormal gait,Decreased balance,Decreased coordination,Decreased strength,Dizziness,Difficulty walking  Visit Diagnosis: Unsteadiness on feet  Muscle weakness (generalized)  Other abnormalities of gait and mobility     Problem List Patient Active Problem List   Diagnosis Date Noted  . Vitamin D deficiency 04/26/2020  . Encounter for monitoring immunomodulating therapy 04/26/2020  . Multiple sclerosis (HCC) 05/25/2019  . Demyelinating disease of central nervous system (HCC) 03/23/2019  . High risk medication use 03/23/2019  . Gait abnormality 03/23/2019  . Ataxia 03/23/2019  . Urinary dysfunction 03/23/2019  . Numbness and tingling 03/08/2019  . Hyperlipidemia 03/08/2019  . Anxiety 03/08/2019  . Tremor 03/07/2019  . History of miscarriage 07/12/2016    Drake Leach, PT, DPT  06/22/2020, 1:11 PM  [facility] [address] [place], [place], [address] Phone: [phone]   Fax:  [phone]  Name: Abigail Lewis MRN: [id number] Date of Birth: 1990/11/18

## 2020-06-24 ENCOUNTER — Telehealth: Payer: Self-pay | Admitting: Neurology

## 2020-06-24 NOTE — Telephone Encounter (Signed)
Pt called stating that since Monday she has been feeling a pain in her ribcage area and she has taken pain medication for it but the pain will not go away. Pt would like to be called and advised.

## 2020-06-27 ENCOUNTER — Other Ambulatory Visit: Payer: Self-pay | Admitting: *Deleted

## 2020-06-27 DIAGNOSIS — N390 Urinary tract infection, site not specified: Secondary | ICD-10-CM

## 2020-06-27 NOTE — Telephone Encounter (Signed)
Called pt back. She states ribcage pain has resolved. She feels she may be getting UTI. Had to get up 3 times last night to urinate, has strong odor. Not drinking much water. I recommended she increase fluid intake. She may be dehydrated. Relayed per MD that he would like her to come in for  UA/culture and CBC. I placed her on lab schedule for 06/28/20 at 3:30pm per pt request. Placed future lab orders as well.

## 2020-06-28 ENCOUNTER — Other Ambulatory Visit: Payer: Self-pay

## 2020-06-28 ENCOUNTER — Ambulatory Visit: Payer: 59 | Admitting: Occupational Therapy

## 2020-07-05 ENCOUNTER — Ambulatory Visit: Payer: 59 | Admitting: Physical Therapy

## 2020-07-05 ENCOUNTER — Ambulatory Visit: Payer: 59 | Admitting: Occupational Therapy

## 2020-07-19 ENCOUNTER — Ambulatory Visit: Payer: 59 | Admitting: Physical Therapy

## 2020-07-21 ENCOUNTER — Telehealth: Payer: Self-pay | Admitting: Neurology

## 2020-07-21 DIAGNOSIS — R269 Unspecified abnormalities of gait and mobility: Secondary | ICD-10-CM

## 2020-07-21 DIAGNOSIS — G35 Multiple sclerosis: Secondary | ICD-10-CM

## 2020-07-21 MED ORDER — DALFAMPRIDINE ER 10 MG PO TB12: 10.0000 mg | ORAL_TABLET | Freq: Two times a day (BID) | ORAL | 2 refills | Status: DC

## 2020-07-21 NOTE — Telephone Encounter (Signed)
Called back. Spoke w/ Jill Alexanders. He states we could use either [facility] or [facility]. I called pt, LVM letting her know we [facility], provided phone#. Advised we made change [facility]t [facility] being out of network now with her new insurance. Asked her to call back if she has any questions or would like [place] to do something different.

## 2020-07-21 NOTE — Telephone Encounter (Signed)
Narda Amber from [facility] called stating that since the pt has new insurance then the [facility] not in network to be able to provide the pt her dalfampridine 10 MG TB12. She stated that the [facility] [phone] and the [facility] [phone] are in network. Please advise.

## 2020-07-27 ENCOUNTER — Telehealth: Payer: Self-pay

## 2020-07-27 NOTE — Telephone Encounter (Signed)
I have submitted a PA for modafinil on CMM, Key: [id number] - PA Case ID: [id number]. Awaiting determination.

## 2020-07-27 NOTE — Telephone Encounter (Signed)
Request Reference Number: [id number]. MODAFINIL TAB 200MG  is approved through 07/27/2021.

## 2020-07-28 NOTE — Telephone Encounter (Signed)
Called pt back. She has not reached out to [facility] directly. I advised her to call them directly at [phone] to get update on prescription. She will do this.

## 2020-07-28 NOTE — Telephone Encounter (Signed)
Pt called, have not received dalfampridine 10 MG TB12 from [facility]. Would like a call from the nurse.

## 2020-08-01 ENCOUNTER — Other Ambulatory Visit: Payer: Self-pay | Admitting: Neurology

## 2020-08-01 ENCOUNTER — Encounter: Payer: Self-pay | Admitting: Neurology

## 2020-08-01 DIAGNOSIS — G35 Multiple sclerosis: Secondary | ICD-10-CM

## 2020-08-01 DIAGNOSIS — R269 Unspecified abnormalities of gait and mobility: Secondary | ICD-10-CM

## 2020-08-01 MED ORDER — DALFAMPRIDINE ER 10 MG PO TB12: 10.0000 mg | ORAL_TABLET | Freq: Two times a day (BID) | ORAL | 2 refills | Status: DC

## 2020-08-01 NOTE — Telephone Encounter (Signed)
Initial request was sent with receipt of confirmation on 6/30. I have resent the refill to the pharmacy given they are saying they never got the refill. I have resent it and received confirmation once more that it went through to the pharmacy at 5:20 pm   E-Prescribing Status: Receipt confirmed by pharmacy (08/01/2020  5:20 PM EDT)  **if pt calls back please advise that it has been resent for her to the pharmacy

## 2020-08-01 NOTE — Telephone Encounter (Signed)
Sent the prescription again for the [facility] to [facility].  States that it went through at 5:20 pm to the pharmacy.

## 2020-08-01 NOTE — Telephone Encounter (Signed)
error 

## 2020-08-01 NOTE — Telephone Encounter (Signed)
Pt called, contacted [facility]; they said no order has been placed and  no recent shipment made. Would like a call from the nurse.

## 2020-08-02 ENCOUNTER — Ambulatory Visit: Payer: BLUE CROSS/BLUE SHIELD | Admitting: Physical Therapy

## 2020-08-02 ENCOUNTER — Ambulatory Visit: Payer: 59 | Admitting: Occupational Therapy

## 2020-08-04 ENCOUNTER — Telehealth: Payer: Self-pay | Admitting: Neurology

## 2020-08-04 ENCOUNTER — Other Ambulatory Visit: Payer: Self-pay | Admitting: Neurology

## 2020-08-04 DIAGNOSIS — G35 Multiple sclerosis: Secondary | ICD-10-CM

## 2020-08-04 DIAGNOSIS — R269 Unspecified abnormalities of gait and mobility: Secondary | ICD-10-CM

## 2020-08-04 MED ORDER — DALFAMPRIDINE ER 10 MG PO TB12: 10.0000 mg | ORAL_TABLET | Freq: Two times a day (BID) | ORAL | 2 refills | Status: DC

## 2020-08-04 NOTE — Telephone Encounter (Signed)
Rx has been sent to the correct pharmacy per request

## 2020-08-04 NOTE — Telephone Encounter (Signed)
Sinath from [facility] called stating that they are needing the pts RX for her dalfampridine 10 MG TB12 [[id number]] sent to them and not [facility] due to them not being able to fill this RX for the pt. Please advise.

## 2020-08-08 NOTE — Telephone Encounter (Signed)
[facility] Location manager) called, insurance requires a PA. Have initiated PA via CoverMyMeds.  CoverMyMeds Key: [id number]

## 2020-08-09 ENCOUNTER — Telehealth: Payer: Self-pay | Admitting: Neurology

## 2020-08-09 NOTE — Telephone Encounter (Signed)
PA completed on [facility] [id number] Waiting for decision

## 2020-08-09 NOTE — Telephone Encounter (Signed)
PA approved Request Reference Number: [id number]. DALFAMPRIDIN TAB 10MG  ER is approved through 02/09/2021. Your patient may now fill this prescription and it will be covered.

## 2020-08-11 NOTE — Telephone Encounter (Signed)
Received fax that rx dalfampridine transferred to [facility]. Phone: [phone]

## 2020-08-15 ENCOUNTER — Telehealth: Payer: Self-pay | Admitting: Neurology

## 2020-08-15 NOTE — Telephone Encounter (Signed)
Pt called and LVM stating that her  dalfampridine 10 MG TB12 with her new insurance is costing her $150 and she is wanting to know if we have some kind of discount card or something to give her so it can be affordable for her. Please advise.

## 2020-08-15 NOTE — Telephone Encounter (Signed)
[facility] specialty pharmacy and spoke w/ [place]. She states copay 198.98. No copay assistance for dalfampridine. Pt can call them at [phone]. They have other in house financial assistance she may qualify for.   I called pt and provided her info above. She verbalized understanding and appreciation.

## 2020-08-16 ENCOUNTER — Ambulatory Visit: Payer: BLUE CROSS/BLUE SHIELD | Admitting: Physical Therapy

## 2020-08-16 ENCOUNTER — Ambulatory Visit: Payer: 59 | Admitting: Occupational Therapy

## 2020-08-23 ENCOUNTER — Telehealth: Payer: Self-pay | Admitting: Neurology

## 2020-08-23 NOTE — Telephone Encounter (Signed)
Called pt back. Advised this rx has to be filled via specialty pharmacy since it is a specialty drug. She has not called [facility] to discuss in house financial assistance yet. I provided her this # last week. I gave her # again to call:  [phone]. She will give them a call to be screened for financial assistance.

## 2020-08-23 NOTE — Telephone Encounter (Signed)
Rx modafinil last sent 04/27/20 #30, 5 refills to this pharmacy. Should have refills on file. Too soon to send new script.

## 2020-08-23 NOTE — Telephone Encounter (Signed)
Pt request refill modafinil (PROVIGIL) [facility] [phone]

## 2020-08-23 NOTE — Telephone Encounter (Signed)
Pt request refill Dalfampridine 10 mg at [facility] [address] [place], [place], [place] [address].  Check on [facility] can get medication cheaper at [facility].

## 2020-08-30 ENCOUNTER — Ambulatory Visit: Payer: BLUE CROSS/BLUE SHIELD | Admitting: Physical Therapy

## 2020-08-30 ENCOUNTER — Encounter: Payer: BLUE CROSS/BLUE SHIELD | Admitting: Occupational Therapy

## 2020-08-31 ENCOUNTER — Other Ambulatory Visit: Payer: Self-pay | Admitting: Neurology

## 2020-09-06 ENCOUNTER — Other Ambulatory Visit: Payer: Self-pay | Admitting: Neurology

## 2020-09-06 DIAGNOSIS — G35 Multiple sclerosis: Secondary | ICD-10-CM

## 2020-09-06 DIAGNOSIS — R5383 Other fatigue: Secondary | ICD-10-CM

## 2020-09-06 MED ORDER — MODAFINIL 200 MG PO TABS: 200.0000 mg | ORAL_TABLET | ORAL | 2 refills | Status: DC

## 2020-09-06 NOTE — Telephone Encounter (Signed)
Pt is due. Upcoming apt scheduled in October. Will send to Dr Epimenio Foot for review.

## 2020-09-06 NOTE — Addendum Note (Signed)
Addended by: Judi Cong on: 09/06/2020 12:16 PM   Modules accepted: Orders

## 2020-09-06 NOTE — Telephone Encounter (Signed)
Pt called requesting refill for modafinil (PROVIGIL) 200 MG tablet. [phone].

## 2020-09-08 ENCOUNTER — Telehealth: Payer: Self-pay | Admitting: *Deleted

## 2020-09-08 NOTE — Telephone Encounter (Signed)
Submitted PA modafinil on CMM. Key: [id number] - PA Case ID: [id number] - Rx #: [id number]. Waiting on determination from [facility].

## 2020-09-12 NOTE — Telephone Encounter (Signed)
Received the following message from insurance on CMM:  "N/Aon August 21 This medication or product was previously approved on [id number] from 07/27/2020 to 07/29/2021 . You will be able to fill a prescription for this medication at your pharmacy. If your pharmacy has questions regarding the processing of your prescription, please have them call the [facility] pharmacy help desk at [phone]."

## 2020-09-13 ENCOUNTER — Encounter: Payer: BLUE CROSS/BLUE SHIELD | Admitting: Occupational Therapy

## 2020-09-13 ENCOUNTER — Ambulatory Visit: Payer: BLUE CROSS/BLUE SHIELD | Admitting: Physical Therapy

## 2020-10-17 DIAGNOSIS — Z0289 Encounter for other administrative examinations: Secondary | ICD-10-CM

## 2020-10-18 ENCOUNTER — Telehealth: Payer: Self-pay | Admitting: *Deleted

## 2020-10-18 NOTE — Telephone Encounter (Signed)
Pt requested form for medical info and restriction assessment be filled out to allow her sit/stand desk at work. This was filled out/signed by MD and given back to medical records to process for pt.

## 2020-10-19 ENCOUNTER — Telehealth: Payer: Self-pay | Admitting: *Deleted

## 2020-10-19 NOTE — Telephone Encounter (Signed)
I faxed pt accommodation form on 10/19/20

## 2020-10-27 ENCOUNTER — Encounter: Payer: Self-pay | Admitting: Neurology

## 2020-10-27 ENCOUNTER — Ambulatory Visit: Payer: 59 | Admitting: Neurology

## 2020-10-27 ENCOUNTER — Other Ambulatory Visit: Payer: Self-pay

## 2020-10-27 VITALS — BP 103/64 | HR 69 | Ht 63.0 in | Wt 156.0 lb

## 2020-10-27 DIAGNOSIS — R251 Tremor, unspecified: Secondary | ICD-10-CM

## 2020-10-27 DIAGNOSIS — R270 Ataxia, unspecified: Secondary | ICD-10-CM

## 2020-10-27 DIAGNOSIS — R269 Unspecified abnormalities of gait and mobility: Secondary | ICD-10-CM

## 2020-10-27 DIAGNOSIS — G35 Multiple sclerosis: Secondary | ICD-10-CM | POA: Diagnosis not present

## 2020-10-27 DIAGNOSIS — F5105 Insomnia due to other mental disorder: Secondary | ICD-10-CM | POA: Diagnosis not present

## 2020-10-27 DIAGNOSIS — R39198 Other difficulties with micturition: Secondary | ICD-10-CM

## 2020-10-27 DIAGNOSIS — Z79899 Other long term (current) drug therapy: Secondary | ICD-10-CM | POA: Diagnosis not present

## 2020-10-27 DIAGNOSIS — F99 Mental disorder, not otherwise specified: Secondary | ICD-10-CM

## 2020-10-27 MED ORDER — TRAZODONE HCL 50 MG PO TABS: ORAL_TABLET | ORAL | 11 refills | Status: DC

## 2020-10-27 MED ORDER — TAMSULOSIN HCL 0.4 MG PO CAPS: ORAL_CAPSULE | ORAL | 11 refills | Status: DC

## 2020-10-27 NOTE — Progress Notes (Addendum)
[facility]  PATIENT: Abigail Lewis DOB: 1990/04/07  REFERRING DOCTOR OR PCP:   Dr. Arelia Sneddon (Ob/Gyn); [facility] (PCP) SOURCE: Notes from recent emergency room and hospital stay and neurology, imaging and lab reports, MRI images personally reviewed.  _________________________________   HISTORICAL  CHIEF COMPLAINT:  Chief Complaint  Patient presents with   Follow-up    Pt alone, rm 1. MS follow up. Last ov was 05/17/2020, pt is on ocrevus and next infusion is due 11/17/20. Infused here at [facility].     HISTORY OF PRESENT ILLNESS:  Abigail Lewis is a 30 y.o. woman with relapsing remitting MS.  Update 10/27/2020 She is on Ocrevus.  She tolerates it well.   She had an exacerbation or pseudo-exacerbation earlier this year with leg problems.  She is walking better and can now go two miles without any assistive device.   Ampyra has helped.    She notes that her legs seem stronger since starting dalfampridine.    Metoprolol may have helped some.   Bladder is better on Vesicare but she had one episode of incontinence recently.   She has had several UTI's.  She has had some hesitancy.   Vision is fine.  Recent eye appt showed no evidence of ON.    She notes some fatigue. Sleep is variable. She sometimes has a mental fog, helped by modafinil. She notes some anxiety, worse this week,  but no si  MRI images are unusual.   She presented with  a longitudinal lesion on cervical spine MRI.  However, anti-NMO and anti-MOG were both negative. She had multiple other more classic appearing spine and brain MS lesions including some of the brain that enhanced.   Additionally she had oligoclonal bands in the CSF.  Therefore, MS is more likely.   Since seronegative NMOSD not ruled out, we opted for a B cell depleter as therapy.  She did the covid 19 vaccination and booster.   MS history:  In 2018 she noted that her legs would get numb and achy.   Symptoms fluctuated but persisted  through 2019 and 2020.  She saw Dr. Allena Katz at [facility] neurology in December 2019 and again for an EMG October 2020 which was normal.  In August 2020 she began to experience lightheadedness with dizziness but not vertigo.  In October, she was noting more issues with her legs experiencing weakness and clumsiness.    She had no falls but started to use a walker due to poor gait.    Symptoms progressed over the rest of 2020.  In January, her left hand started to have a tremor.    In early February 2021, she was unable to walk and needed to crawl down the stairs.   Her voice was slurred.  She had mild right visual changes.   She went to the [facility] ED and MRI's of the brain and cervical spine were concerning for a demyelinating disorder.   She received 5 days of IV Solu-Medrol with some improvement. She continued to improve over the next few months.  Laboratory tests: While in the hospital, she had a lumbar puncture showing 5 oligoclonal bands and elevated IgG index .   Labwork showed positive ANA with elevated chromatin Ab and negative SSA/SSB initially and then milldy increased SSA 2 days later, negative SPEP/IEF, negative anti-NMO.   She is HIV negative.   We subsequently checked the [facility] anti-NMO and MOG and they were negative.    Imaging:  MRIs of the brain, cervical spine and thoracic spine performed between February 13 and March 09, 2019.  The MRI of the brain shows multiple T2/FLAIR hyperintense foci in the cerebellum, right middle cerebellar peduncle, brainstem including periaqueductal gray and hemispheres.  Some of the foci enhanced after contrast.  Additionally, there appeared to be enhancement within the left optic nerve MRI of the cervical spine showed a longitudinal lesion from C1-C2 throughC4 and patchy lesions elsewhere in the lower cervical and upper thoracic spinal cord.  MRI of the thoracic spine showed patchy lesions throughout the spinal cord.  There was no abnormal enhancement in the  spinal cord.  MRI brain 10/20/2019 shows multiple T2/FLAIR hyperintense foci in the cerebellum, brainstem, thalamus and hemispheres in a pattern and configuration consistent with chronic demyelinating plaque associated with multiple sclerosis.  None of the foci enhances or appears to be acute.  There appears to be 1 nonenhancing focus in the cerebellum that was not present on the 03/07/2019 MRI.  All the foci that enhanced on the earlier brain MRI no longer do so.      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  MRI cervical spine 10/20/2019 shows Multiple T2 hyperintense foci within the brainstem, cerebellum and spinal cord in a pattern and configuration consistent with chronic demyelinating plaque associated with multiple sclerosis.  None of the foci appear to be acute.  They did not enhance.  The spinal cord lesions were all present on the 03/07/2019 MRI.    REVIEW OF SYSTEMS: Constitutional: No fevers, chills, sweats, or change in appetite Eyes: No visual changes, double vision, eye pain Ear, nose and throat: No hearing loss, ear pain, nasal congestion, sore throat Cardiovascular: No chest pain, palpitations Respiratory:  No shortness of breath at rest or with exertion.   No wheezes GastrointestinaI: No nausea, vomiting, diarrhea, abdominal pain, fecal incontinence Genitourinary:  No dysuria, urinary retention or frequency.  No nocturia. Musculoskeletal:  No neck pain, back pain Integumentary: No rash, pruritus, skin lesions Neurological: as above Psychiatric: She has had anxiety and depression. Endocrine: No palpitations, diaphoresis, change in appetite, change in weigh or increased thirst Hematologic/Lymphatic:  No anemia, purpura, petechiae. Allergic/Immunologic: No itchy/runny eyes, nasal congestion, recent allergic reactions, rashes  ALLERGIES: Allergies  Allergen Reactions   Penicillins Swelling    HOME MEDICATIONS:  Current Outpatient Medications:    dalfampridine  10 MG TB12, Take 1 tablet (10 mg total) by mouth in the morning and at bedtime., Disp: 180 tablet, Rfl: 2   gabapentin (NEURONTIN) 300 MG capsule, One po qAm, one po qPM and two po qHS, Disp: 120 capsule, Rfl: 11   metoprolol succinate (TOPROL XL) 25 MG 24 hr tablet, Take 1 tablet (25 mg total) by mouth daily., Disp: 90 tablet, Rfl: 4   modafinil (PROVIGIL) 200 MG tablet, Take 1 tablet (200 mg total) by mouth every morning., Disp: 30 tablet, Rfl: 2   ocrelizumab (OCREVUS) 300 MG/10ML injection, Inject 600 mg into the vein every 6 (six) months. [facility] Intrafusion., Disp: , Rfl:    pravastatin (PRAVACHOL) 20 MG tablet, Take 20 mg by mouth daily., Disp: , Rfl:    solifenacin (VESICARE) 10 MG tablet, Take by mouth daily., Disp: , Rfl:    tamsulosin (FLOMAX) 0.4 MG CAPS capsule, One po qAM, Disp: 30 capsule, Rfl: 11   VITAMIN D PO, Take 5,000 Units by mouth daily., Disp: , Rfl:    Vitamin D, Ergocalciferol, (DRISDOL) 1.25 MG (50000 UNIT) CAPS capsule, Take 1 capsule by  mouth once a week., Disp: , Rfl:    sertraline (ZOLOFT) 50 MG tablet, Take 1 tablet (50 mg total) by mouth daily., Disp: 30 tablet, Rfl: 0   traZODone (DESYREL) 50 MG tablet, 1/2 to 1 pill po qHS, Disp: 30 tablet, Rfl: 11  PAST MEDICAL HISTORY: Past Medical History:  Diagnosis Date   ANA positive 02/13/2019   Anxiety    Depression    Hyperlipidemia 12/03/2018   LDL=155   Multiple sclerosis, relapsing-remitting (HCC)     PAST SURGICAL HISTORY: Past Surgical History:  Procedure Laterality Date   NO PAST SURGERIES     TOOTH EXTRACTION      FAMILY HISTORY: Family History  Problem Relation Age of Onset   Stroke Mother    Diabetes Father    Healthy Sister    Autism Brother     SOCIAL HISTORY:  Social History   Socioeconomic History   Marital status: Single    Spouse name: Not on file   Number of children: 0   Years of education: 12   Highest education level: Not on file  Occupational History   Occupation: [employer], [employer] office  Tobacco Use   Smoking status: Never   Smokeless tobacco: Never  Vaping Use   Vaping Use: Never used  Substance and Sexual Activity   Alcohol use: Not Currently    Comment: wine sometimes    Drug use: No   Sexual activity: Yes    Birth control/protection: Pill  Other Topics Concern   Not on file  Social History Narrative   Right handed    Caffeine use: sometimes   She works as a Office manager and part-time as Lawyer.   She lives alone.  No children.    Social Determinants of Health   Financial Resource Strain: Not on file  Food Insecurity: Not on file  Transportation Needs: Not on file  Physical Activity: Not on file  Stress: Not on file  Social Connections: Not on file  Intimate Partner Violence: Not on file     PHYSICAL EXAM  Vitals:   10/27/20 1540  BP: 103/64  Pulse: 69  Weight: 156 lb (70.8 kg)  Height: 5\' 3"  (1.6 m)    Body mass index is 27.63 kg/m.  No results found.   General: The patient is well-developed and well-nourished and in no acute distress  HEENT:  Head is [place]/AT.  Sclera are anicteric.     Skin: Extremities are without rash or  edema.   Neurologic Exam  Mental status: The patient is alert and oriented x 3 at the time of the examination. The patient has apparent normal recent and remote memory, with an apparently normal attention span and concentration ability.   Speech is normal.  Cranial nerves: Extraocular movements are full.    Color vision is symmetric.  Facial strength is normal.  Trapezius and sternocleidomastoid strength is normal. No dysarthria is noted.    No obvious hearing deficits are noted.  Motor:  Muscle bulk is normal.   Tone is normal. Strength is  5 / 5 in all 4 extremities.   Sensory: She had intact sensation to touch, temperature and vibration.  Coordination: She has reduced left finger-nose-finger and reduced left heel-to-shin..   Gait and station: Station is normal. Currently, her gait is mildly  wide and the tandem gait is moderately wide. Romberg is negative now.   Reflexes: Deep tendon reflexes are symmetric and normal in arms and increased at the legs.  There is no ankle clonus..    CF      DIAGNOSTIC DATA (LABS, IMAGING, TESTING) - I reviewed patient records, labs, notes, testing and imaging myself where available.  Lab Results  Component Value Date   WBC 3.4 10/13/2019   HGB 11.7 10/13/2019   HCT 33.8 (L) 10/13/2019   MCV 94 10/13/2019   PLT 243 10/13/2019      Component Value Date/Time   NA 139 03/12/2019 0219   K 3.8 03/12/2019 0219   CL 106 03/12/2019 0219   CO2 24 03/12/2019 0219   GLUCOSE 110 (H) 03/12/2019 0219   BUN 13 03/12/2019 0219   CREATININE 0.72 03/12/2019 0219   CALCIUM 9.3 03/12/2019 0219   PROT 6.8 10/13/2019 1349   ALBUMIN 4.6 10/13/2019 1349   AST 13 10/13/2019 1349   ALT 15 10/13/2019 1349   ALKPHOS 44 10/13/2019 1349   BILITOT 0.3 10/13/2019 1349   GFRNONAA >60 03/12/2019 0219   GFRAA >60 03/12/2019 0219   Lab Results  Component Value Date   CHOL 207 (H) 07/12/2016   HDL 61 07/12/2016   LDLCALC 137 (H) 07/12/2016   TRIG 43 07/12/2016   CHOLHDL 3.4 07/12/2016   Lab Results  Component Value Date   HGBA1C 5.1 03/10/2019   Lab Results  Component Value Date   VITAMINB12 894 03/08/2019   Lab Results  Component Value Date   TSH 2.212 03/08/2019       ASSESSMENT AND PLAN  Multiple sclerosis (HCC) - Plan: MR BRAIN W WO CONTRAST, MR CERVICAL SPINE W WO CONTRAST, IgG, IgA, IgM, CBC with Differential/Platelet  Insomnia due to other mental disorder - Plan: traZODone (DESYREL) 50 MG tablet  High risk medication use - Plan: MR BRAIN W WO CONTRAST, MR CERVICAL SPINE W WO CONTRAST, IgG, IgA, IgM, CBC with Differential/Platelet  Ataxia  Gait abnormality  Tremor  Urinary dysfunction   1.  Her MS has been stable since starting Ocrevus.  MRI 10/12/2019 showed improvement of the spinal cord plaques compared to February 2021.   We will check another MRI before the end of the year to determine if there is any subclinical progression and consider a different DMT if this is occurring.  Check lab work 2.    She has recently seen Dr. Arelia Sneddon [facility].  Currently her MS is stable on the Ocrevus.  If she does become pregnant while on Ocrevus, I will likely switch her over to Copaxone as medication like Ocrevus could affect lymphocytes counts in the fetus.  However, due to the aggressiveness of her MS AND MULTIPLE CERVICAL SPINE LESIONS I feel she would best benefit from staying on the much more efficacious Ocrevus until pregnancy actually occurs.  We then will try to get her switch back on Ocrevus shortly after delivery.  As a large monoclonal antibody, only a small amount would be absorbed by the baby if she breast-feeds.  Tysabri would be another efficacious medication.  Similar to Ocrevus, Tysabri during the second half of pregnancy can lead to transient hematologic abnormalities. 3.    Continue gabapentin to 300-300-600.  However, since her dysesthetic sensations are better she could try to lower the dose.    Continue low-dose metoprolol for tremor (high dose because hypertension)  4.   Stay active and exercise as tolerated.   5.   Return in 6 months or sooner if there are new or worsening neurologic symptoms.    Isys Tietje A. Jaedah Lords,  MD, [facility] 10/27/2020, 6:40 PM Certified in Neurology, Clinical Neurophysiology, Sleep Medicine and Neuroimaging  [facility] [address] [place], [place] [address] [phone]

## 2020-10-28 LAB — CBC WITH DIFFERENTIAL/PLATELET
Basophils Absolute: 0 10*3/uL (ref 0.0–0.2)
Basos: 0 %
EOS (ABSOLUTE): 0.2 10*3/uL (ref 0.0–0.4)
Eos: 3 %
Hematocrit: 32.7 % — ABNORMAL LOW (ref 34.0–46.6)
Hemoglobin: 11.1 g/dL (ref 11.1–15.9)
Immature Grans (Abs): 0 10*3/uL (ref 0.0–0.1)
Immature Granulocytes: 0 %
Lymphocytes Absolute: 1.6 10*3/uL (ref 0.7–3.1)
Lymphs: 29 %
MCH: 31.3 pg (ref 26.6–33.0)
MCHC: 33.9 g/dL (ref 31.5–35.7)
MCV: 92 fL (ref 79–97)
Monocytes Absolute: 0.4 10*3/uL (ref 0.1–0.9)
Monocytes: 8 %
Neutrophils Absolute: 3.2 10*3/uL (ref 1.4–7.0)
Neutrophils: 60 %
Platelets: 258 10*3/uL (ref 150–450)
RBC: 3.55 x10E6/uL — ABNORMAL LOW (ref 3.77–5.28)
RDW: 12.1 % (ref 11.7–15.4)
WBC: 5.4 10*3/uL (ref 3.4–10.8)

## 2020-10-28 LAB — IGG, IGA, IGM
IgA/Immunoglobulin A, Serum: 274 mg/dL (ref 87–352)
IgG (Immunoglobin G), Serum: 952 mg/dL (ref 586–1602)
IgM (Immunoglobulin M), Srm: 31 mg/dL (ref 26–217)

## 2020-11-22 ENCOUNTER — Telehealth: Payer: Self-pay | Admitting: Neurology

## 2020-11-22 NOTE — Telephone Encounter (Signed)
11/22/20 x2 LVM EE  11/07/20 LVM to schedule TF 11/03/20 [employer] PA #[id number] (11/03/20- 12/18/20) TF

## 2020-11-23 NOTE — Telephone Encounter (Signed)
pt returned my call due to the cost she is going to hold off

## 2020-11-27 ENCOUNTER — Other Ambulatory Visit: Payer: Self-pay | Admitting: Neurology

## 2020-11-27 DIAGNOSIS — F5105 Insomnia due to other mental disorder: Secondary | ICD-10-CM

## 2020-11-27 DIAGNOSIS — F99 Mental disorder, not otherwise specified: Secondary | ICD-10-CM

## 2020-11-30 ENCOUNTER — Telehealth: Payer: Self-pay | Admitting: *Deleted

## 2020-11-30 NOTE — Telephone Encounter (Signed)
Pt accommodation form faxed on 11/30/20

## 2020-12-29 ENCOUNTER — Other Ambulatory Visit: Payer: Self-pay | Admitting: Neurology

## 2020-12-31 ENCOUNTER — Other Ambulatory Visit: Payer: Self-pay | Admitting: Neurology

## 2020-12-31 DIAGNOSIS — R5383 Other fatigue: Secondary | ICD-10-CM

## 2020-12-31 DIAGNOSIS — G35 Multiple sclerosis: Secondary | ICD-10-CM

## 2021-01-19 DIAGNOSIS — Z0289 Encounter for other administrative examinations: Secondary | ICD-10-CM

## 2021-01-19 NOTE — Telephone Encounter (Signed)
Gave updated forms back to medical records to process for pt.

## 2021-01-24 ENCOUNTER — Telehealth: Payer: Self-pay | Admitting: *Deleted

## 2021-01-24 NOTE — Telephone Encounter (Signed)
I faxed pt assessment form on 01/24/21.

## 2021-02-14 ENCOUNTER — Telehealth: Payer: Self-pay | Admitting: *Deleted

## 2021-02-14 NOTE — Telephone Encounter (Signed)
PA approved effective 01/15/2021 through 02/14/2022.

## 2021-02-14 NOTE — Telephone Encounter (Signed)
Submitted PA modafinil on CMM. KeyRhea Bleacher - PA Case ID: [id number] - Rx #: [id number]. Waiting on determination from [employer].

## 2021-02-16 ENCOUNTER — Telehealth: Payer: Self-pay | Admitting: Neurology

## 2021-02-16 NOTE — Telephone Encounter (Addendum)
5 [facility] has called to report that a PA is needed on pt's  dalfampridine 10 MG TB12 Zara gave contact # to [employer]  [employer]  PA dept )[phone]

## 2021-02-16 NOTE — Telephone Encounter (Signed)
Called number provided. It was to [facility] call center/[employer]. Spoke w/ Nunzio Cory. Completed PA over the phone. PA approved #[id number] 02/04/21-02/16/22. [facility]. No PA or ref#. Documented under pt ID. They will send copy of approval via fax.

## 2021-03-08 DIAGNOSIS — Z0289 Encounter for other administrative examinations: Secondary | ICD-10-CM

## 2021-03-15 ENCOUNTER — Telehealth: Payer: Self-pay | Admitting: *Deleted

## 2021-03-15 NOTE — Telephone Encounter (Signed)
I faxed [employer] form on 03/15/21.

## 2021-03-15 NOTE — Telephone Encounter (Signed)
Gave completed/signed [employer] form back to medical records to process for pt. MD approved removing lifting restrictions for pt as requested, allowing her to lift 30-45lb.

## 2021-04-19 ENCOUNTER — Other Ambulatory Visit: Payer: Self-pay | Admitting: Neurology

## 2021-04-19 DIAGNOSIS — R5383 Other fatigue: Secondary | ICD-10-CM

## 2021-04-19 DIAGNOSIS — G35 Multiple sclerosis: Secondary | ICD-10-CM

## 2021-04-19 MED ORDER — MODAFINIL 200 MG PO TABS: 200.0000 mg | ORAL_TABLET | Freq: Every morning | ORAL | 2 refills | Status: DC

## 2021-04-19 NOTE — Telephone Encounter (Addendum)
Last OV was on 10/27/20.  ?Next OV is pending to be scheduled.  ?Last RX was written on 03/16/21 for 30 tabs.  ? ?[place] Drug Database has been reviewed.  ? ?rx printed by mistake instead of being e-scribed. it never got sent to the pharmacy. Do you mind re-sending electronically ?

## 2021-04-19 NOTE — Addendum Note (Signed)
Addended by: Melanee Spry on: 04/19/2021 01:50 PM ? ? Modules accepted: Orders ? ?

## 2021-04-20 MED ORDER — MODAFINIL 200 MG PO TABS: 200.0000 mg | ORAL_TABLET | Freq: Every morning | ORAL | 2 refills | Status: DC

## 2021-04-20 NOTE — Telephone Encounter (Signed)
Rx failed to e-scribe x3. Faxed printed/signed rx to pharmacy at [phone]. Received fax confirmation. ?

## 2021-04-20 NOTE — Addendum Note (Signed)
Addended by: Arther Abbott on: 04/20/2021 09:19 AM ? ? Modules accepted: Orders ? ?

## 2021-05-09 ENCOUNTER — Telehealth: Payer: Self-pay | Admitting: Neurology

## 2021-05-09 ENCOUNTER — Other Ambulatory Visit: Payer: Self-pay | Admitting: *Deleted

## 2021-05-09 DIAGNOSIS — R269 Unspecified abnormalities of gait and mobility: Secondary | ICD-10-CM

## 2021-05-09 DIAGNOSIS — G35 Multiple sclerosis: Secondary | ICD-10-CM

## 2021-05-09 MED ORDER — DALFAMPRIDINE ER 10 MG PO TB12: 10.0000 mg | ORAL_TABLET | Freq: Two times a day (BID) | ORAL | 2 refills | Status: DC

## 2021-05-09 NOTE — Telephone Encounter (Signed)
Refill sent for the pt to the pharmacy requested 

## 2021-05-09 NOTE — Telephone Encounter (Signed)
Chaundra from [facility] called stating that they are needing a new Rx for the pt's dalfampridine 10 MG TB12. ?She states that the old one in the system is expired. Please advise. ?

## 2021-05-15 NOTE — Telephone Encounter (Signed)
The [employer] has called again on this patient asking for refill to [facility]. Advised [facility] was [facility] on 05/09/2021 with confirmation that it went through.  ?

## 2021-07-05 ENCOUNTER — Telehealth: Payer: Self-pay | Admitting: Neurology

## 2021-07-05 NOTE — Telephone Encounter (Signed)
LVM asking pt to call. She was last seen 10/27/20. Dr. Epimenio Foot wanted to see her back in 6 months, she has no pending appt.   She will need to be seen to have updated visit/discuss meds. If she calls, please offer 07/11/21 at 1:30pm with Dr. Epimenio Foot

## 2021-07-05 NOTE — Telephone Encounter (Signed)
Pt states the current bladder medication is not as good as her previous medication: Solisenacin, pt is asking for a call to discuss going back on previous medication or something else.

## 2021-07-18 ENCOUNTER — Telehealth: Payer: Self-pay | Admitting: Neurology

## 2021-07-18 DIAGNOSIS — R269 Unspecified abnormalities of gait and mobility: Secondary | ICD-10-CM

## 2021-07-18 DIAGNOSIS — G35 Multiple sclerosis: Secondary | ICD-10-CM

## 2021-07-18 MED ORDER — SOLIFENACIN SUCCINATE 10 MG PO TABS: 10.0000 mg | ORAL_TABLET | Freq: Every day | ORAL | 5 refills | Status: AC

## 2021-07-18 MED ORDER — DALFAMPRIDINE ER 10 MG PO TB12: 10.0000 mg | ORAL_TABLET | Freq: Two times a day (BID) | ORAL | 2 refills | Status: DC

## 2021-07-18 NOTE — Telephone Encounter (Signed)
Pt has scheduled her 6 mo f/u and is on wait list.  Pt is asking for a refill on her dalfampridine 10 MG TB12, pt is asking if she can be put back on  solifenacin (VESICARE) 10 MG tablet instead of current bladder medication.  Pt is also asking to be put on something for incontinence of her bowels

## 2021-07-18 NOTE — Telephone Encounter (Signed)
Called the patient and advised that Dr Epimenio Foot was ok with the patient going back to the vesicare and that can sometimes help with both bladder and bowel. Pt verbalized understanding and was appreciative for the call back. We will dc the flomax. This med will go to [facility] and Jannifer Rodney will go to [employer]

## 2021-08-16 ENCOUNTER — Telehealth: Payer: Self-pay | Admitting: Neurology

## 2021-08-16 NOTE — Telephone Encounter (Signed)
Received a notification from [facility] stating the pt is being discharged from their case management program as their ongoing health needs no longer requires case management services

## 2021-09-08 ENCOUNTER — Other Ambulatory Visit: Payer: Self-pay | Admitting: Neurology

## 2021-09-08 DIAGNOSIS — R5383 Other fatigue: Secondary | ICD-10-CM

## 2021-09-08 DIAGNOSIS — G35 Multiple sclerosis: Secondary | ICD-10-CM

## 2021-09-11 NOTE — Telephone Encounter (Signed)
Last OV was on 10/27/20.  Next OV is scheduled for 11/09/21.  Last RX was written on 08/09/21 for 30 tabs.   [place] Drug Database has been reviewed.

## 2021-11-09 ENCOUNTER — Encounter: Payer: Self-pay | Admitting: Neurology

## 2021-11-09 ENCOUNTER — Ambulatory Visit (INDEPENDENT_AMBULATORY_CARE_PROVIDER_SITE_OTHER): Payer: Federal, State, Local not specified - PPO | Admitting: Neurology

## 2021-11-09 VITALS — BP 114/69 | HR 94 | Ht 63.0 in | Wt 151.5 lb

## 2021-11-09 DIAGNOSIS — R270 Ataxia, unspecified: Secondary | ICD-10-CM

## 2021-11-09 DIAGNOSIS — R41840 Attention and concentration deficit: Secondary | ICD-10-CM

## 2021-11-09 DIAGNOSIS — G35 Multiple sclerosis: Secondary | ICD-10-CM

## 2021-11-09 DIAGNOSIS — R39198 Other difficulties with micturition: Secondary | ICD-10-CM

## 2021-11-09 DIAGNOSIS — Z79899 Other long term (current) drug therapy: Secondary | ICD-10-CM

## 2021-11-09 DIAGNOSIS — R269 Unspecified abnormalities of gait and mobility: Secondary | ICD-10-CM | POA: Diagnosis not present

## 2021-11-09 DIAGNOSIS — F99 Mental disorder, not otherwise specified: Secondary | ICD-10-CM

## 2021-11-09 DIAGNOSIS — F5105 Insomnia due to other mental disorder: Secondary | ICD-10-CM | POA: Diagnosis not present

## 2021-11-09 DIAGNOSIS — G373 Acute transverse myelitis in demyelinating disease of central nervous system: Secondary | ICD-10-CM

## 2021-11-09 DIAGNOSIS — E559 Vitamin D deficiency, unspecified: Secondary | ICD-10-CM

## 2021-11-09 MED ORDER — AMPHETAMINE-DEXTROAMPHET ER 15 MG PO CP24: 15.0000 mg | ORAL_CAPSULE | ORAL | 0 refills | Status: DC

## 2021-11-09 NOTE — Progress Notes (Signed)
[facility]  PATIENT: Abigail Lewis DOB: 1990/03/30  REFERRING DOCTOR OR PCP:   Dr. Arelia Sneddon (Ob/Gyn); [facility] (PCP) SOURCE: Notes from recent emergency room and hospital stay and neurology, imaging and lab reports, MRI images personally reviewed.  _________________________________   HISTORICAL  CHIEF COMPLAINT:  Chief Complaint  Patient presents with   Follow-up    Pt in room #1 and alone. Pt here today for f/u ocrevus for MS.    HISTORY OF PRESENT ILLNESS:  Abigail Lewis is a 31 y.o. woman with relapsing remitting MS.  Update 11/09/2021 She is on Ocrevus.  She tolerates it well.   She has no recent exacerbation or new symptoms.   10/27/2020 IgG and IgM were normal.   Last MRI was from 2021 and showed 1 lesion not present on the previous MRI, prompting initiation of Ocrevus.  She is walking better and can now go two miles without any assistive device.   Ampyra has helped her gait and balance - she stopped a week and noted a difference so went back on..    She notes that her legs seem stronger since starting dalfampridine.    Metoprolol may have helped some.   Bladder is better on Vesicare but she had one episode of incontinence recently.   She has had several UTI's.  She has had some hesitancy.  She has bowel issues and had fecal incontinence x 1 6 weeks ago.     Vision is fine.  Recent eye appt showed no evidence of ON.    She notes some fatigue. Sleep is variable. Some benefit from modafinil but it seems to be helping less than last year. She has insomnia but no worse, worse with sleep maintenance. She notes some anxiety but no depression   She is on sertraline and takes trazodone at bedtime.  She has reduced cognitive fog with focus/attention.  MRI images are unusual.   She presented with  a longitudinal lesion on cervical spine MRI.  However, anti-NMO and anti-MOG were both negative. She had multiple other more classic appearing spine and brain MS  lesions including some of the brain that enhanced.   Additionally she had oligoclonal bands in the CSF.  Therefore, MS is more likely.   Since seronegative NMOSD not ruled out, we opted for a B cell depleter as therapy.  She is on vitamin D supplements  MS history:  In 2018 she noted that her legs would get numb and achy.   Symptoms fluctuated but persisted through 2019 and 2020.  She saw Dr. Allena Katz at [facility] neurolog[facility] in December 2019 and again for an EMG October 2020 which was normal.  In August 2020 she began to experience lightheadedness with dizziness but not vertigo.  In October, she was noting more issues with her legs experiencing weakness and clumsiness.    She had no falls but started to use a walker due to poor gait.    Symptoms progressed over the rest of 2020.  In January, her left hand started to have a tremor.    In early February 2021, she was unable to walk and needed to crawl down the stairs.   Her voice was slurred.  She had mild right visual changes.   She went to the [facility] ED and MRI's of the brain and cervical spine were concerning for a demyelinating disorder.   She received 5 days of IV Solu-Medrol with some improvement. She continued to improve over the next few  months.  Laboratory tests: While in the hospital, she had a lumbar puncture showing 5 oligoclonal bands and elevated IgG index .   Labwork showed positive ANA with elevated chromatin Ab and negative SSA/SSB initially and then milldy increased SSA 2 days later, negative SPEP/IEF, negative anti-NMO.   She is HIV negative.   We subsequently checked the [facility] anti-NMO and MOG and they were negative.    Imaging:  MRIs of the brain, cervical spine and thoracic spine performed between February 13 and March 09, 2019.  The MRI of the brain shows multiple T2/FLAIR hyperintense foci in the cerebellum, right middle cerebellar peduncle, brainstem including periaqueductal gray and hemispheres.  Some of the foci enhanced  after contrast.  Additionally, there appeared to be enhancement within the left optic nerve MRI of the cervical spine showed a longitudinal lesion from C1-C2 throughC4 and patchy lesions elsewhere in the lower cervical and upper thoracic spinal cord.  MRI of the thoracic spine showed patchy lesions throughout the spinal cord.  There was no abnormal enhancement in the spinal cord.  MRI brain 10/20/2019 shows multiple T2/FLAIR hyperintense foci in the cerebellum, brainstem, thalamus and hemispheres in a pattern and configuration consistent with chronic demyelinating plaque associated with multiple sclerosis.  None of the foci enhances or appears to be acute.  There appears to be 1 nonenhancing focus in the cerebellum that was not present on the 03/07/2019 MRI.  All the foci that enhanced on the earlier brain MRI no longer do so.      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  MRI cervical spine 10/20/2019 shows Multiple T2 hyperintense foci within the brainstem, cerebellum and spinal cord in a pattern and configuration consistent with chronic demyelinating plaque associated with multiple sclerosis.  None of the foci appear to be acute.  They did not enhance.  The spinal cord lesions were all present on the 03/07/2019 MRI.    REVIEW OF SYSTEMS: Constitutional: No fevers, chills, sweats, or change in appetite Eyes: No visual changes, double vision, eye pain Ear, nose and throat: No hearing loss, ear pain, nasal congestion, sore throat Cardiovascular: No chest pain, palpitations Respiratory:  No shortness of breath at rest or with exertion.   No wheezes GastrointestinaI: No nausea, vomiting, diarrhea, abdominal pain, fecal incontinence Genitourinary:  No dysuria, urinary retention or frequency.  No nocturia. Musculoskeletal:  No neck pain, back pain Integumentary: No rash, pruritus, skin lesions Neurological: as above Psychiatric: She has had anxiety and depression. Endocrine: No  palpitations, diaphoresis, change in appetite, change in weigh or increased thirst Hematologic/Lymphatic:  No anemia, purpura, petechiae. Allergic/Immunologic: No itchy/runny eyes, nasal congestion, recent allergic reactions, rashes  ALLERGIES: Allergies  Allergen Reactions   Penicillins Swelling    HOME MEDICATIONS:  Current Outpatient Medications:    amphetamine-dextroamphetamine (ADDERALL XR) 15 MG 24 hr capsule, Take 1 capsule by mouth every morning., Disp: 30 capsule, Rfl: 0   dalfampridine 10 MG TB12, Take 1 tablet (10 mg total) by mouth in the morning and at bedtime., Disp: 60 tablet, Rfl: 2   gabapentin (NEURONTIN) 300 MG capsule, TAKE 1 CAPSULE BY MOUTH EVERY MORNING AND EVERY EVENING AND 2 CAPSULE AT BEDTIME, Disp: 120 capsule, Rfl: 11   metoprolol succinate (TOPROL XL) 25 MG 24 hr tablet, Take 1 tablet (25 mg total) by mouth daily., Disp: 90 tablet, Rfl: 4   modafinil (PROVIGIL) 200 MG tablet, TAKE 1 TABLETBY MOUTH EVERY MORNING. MAKE AN APT FOR REFILLS, Disp: 30 tablet, Rfl: 3  ocrelizumab (OCREVUS) 300 MG/10ML injection, Inject 600 mg into the vein every 6 (six) months. [facility] Intrafusion., Disp: , Rfl:    pravastatin (PRAVACHOL) 20 MG tablet, Take 20 mg by mouth daily., Disp: , Rfl:    solifenacin (VESICARE) 10 MG tablet, Take 1 tablet (10 mg total) by mouth daily., Disp: 30 tablet, Rfl: 5   traZODone (DESYREL) 50 MG tablet, TAKE 1/2 TO 1 TABLET BY MOUTH DAILY AT BEDTIME, Disp: 90 tablet, Rfl: 4   VITAMIN D PO, Take 5,000 Units by mouth daily., Disp: , Rfl:    Vitamin D, Ergocalciferol, (DRISDOL) 1.25 MG (50000 UNIT) CAPS capsule, Take 1 capsule by mouth once a week., Disp: , Rfl:    sertraline (ZOLOFT) 50 MG tablet, Take 1 tablet (50 mg total) by mouth daily., Disp: 30 tablet, Rfl: 0  PAST MEDICAL HISTORY: Past Medical History:  Diagnosis Date   ANA positive 02/13/2019   Anxiety    Depression    Hyperlipidemia 12/03/2018   LDL=155   Multiple sclerosis,  relapsing-remitting (HCC)     PAST SURGICAL HISTORY: Past Surgical History:  Procedure Laterality Date   NO PAST SURGERIES     TOOTH EXTRACTION      FAMILY HISTORY: Family History  Problem Relation Age of Onset   Stroke Mother    Diabetes Father    Healthy Sister    Autism Brother     SOCIAL HISTORY:  Social History   Socioeconomic History   Marital status: Single    Spouse name: Not on file   Number of children: 0   Years of education: 12   Highest education level: Not on file  Occupational History   Occupation: [place], post office  Tobacco Use   Smoking status: Never   Smokeless tobacco: Never  Vaping Use   Vaping Use: Never used  Substance and Sexual Activity   Alcohol use: Not Currently    Comment: wine sometimes    Drug use: No   Sexual activity: Yes    Birth control/protection: Pill  Other Topics Concern   Not on file  Social History Narrative   Right handed    Caffeine use: sometimes   She works as a Office manager and part-time as Lawyer.   She lives alone.  No children.    Social Determinants of Health   Financial Resource Strain: Not on file  Food Insecurity: Not on file  Transportation Needs: Not on file  Physical Activity: Not on file  Stress: Not on file  Social Connections: Not on file  Intimate Partner Violence: Not on file     PHYSICAL EXAM  Vitals:   11/09/21 1538  BP: 114/69  Pulse: 94  Weight: 151 lb 8 oz (68.7 kg)  Height:  (1.6 m)     Body mass index is 26.84 kg/m.  No results found.   General: The patient is well-developed and well-nourished and in no acute distress  HEENT:  Head is [place]/AT.  Sclera are anicteric.     Skin: Extremities are without rash or  edema.   Neurologic Exam  Mental status: The patient is alert and oriented x 3 at the time of the examination. The patient has apparent normal recent and remote memory, with an apparently normal attention span and concentration ability.   Speech is  normal.  Cranial nerves: Extraocular movements are full.    Color vision is symmetric.  Facial strength is normal.  Trapezius and sternocleidomastoid strength is normal. No dysarthria is noted.  No obvious hearing deficits are noted.  Motor:  Muscle bulk is normal.   Tone is normal. Strength is  5 / 5 in all 4 extremities.   Sensory: She had intact sensation to touch, temperature and vibration.  Coordination: She has normal finger-nose-finger and reduced left heel-to-shin..   Gait and station: Station is normal.  Gait is mildly wide.  Tandem gait is poor.  Romberg is negative now.   Reflexes: Deep tendon reflexes are symmetric and normal in arms and increased at the legs.  There is no ankle clonus.[name] Kitchen          DIAGNOSTIC DATA (LABS, IMAGING, TESTING) - I reviewed patient records, labs, notes, testing and imaging myself where available.  Lab Results  Component Value Date   WBC 5.4 10/27/2020   HGB 11.1 10/27/2020   HCT 32.7 (L) 10/27/2020   MCV 92 10/27/2020   PLT 258 10/27/2020      Component Value Date/Time   NA 139 03/12/2019 0219   K 3.8 03/12/2019 0219   CL 106 03/12/2019 0219   CO2 24 03/12/2019 0219   GLUCOSE 110 (H) 03/12/2019 0219   BUN 13 03/12/2019 0219   CREATININE 0.72 03/12/2019 0219   CALCIUM 9.3 03/12/2019 0219   PROT 6.8 10/13/2019 1349   ALBUMIN 4.6 10/13/2019 1349   AST 13 10/13/2019 1349   ALT 15 10/13/2019 1349   ALKPHOS 44 10/13/2019 1349   BILITOT 0.3 10/13/2019 1349   GFRNONAA >60 03/12/2019 0219   GFRAA >60 03/12/2019 0219   Lab Results  Component Value Date   CHOL 207 (H) 07/12/2016   HDL 61 07/12/2016   LDLCALC 137 (H) 07/12/2016   TRIG 43 07/12/2016   CHOLHDL 3.4 07/12/2016   Lab Results  Component Value Date   HGBA1C 5.1 03/10/2019   Lab Results  Component Value Date   TKZSWFUX32 355 03/08/2019   Lab Results  Component Value Date   TSH 2.212 03/08/2019       ASSESSMENT AND PLAN  Multiple sclerosis (HCC) - Plan: IgG,  IgA, IgM, MR BRAIN W WO CONTRAST, MR CERVICAL SPINE W WO CONTRAST, CD20 B Cells  Gait abnormality - Plan: IgG, IgA, IgM  High risk medication use - Plan: CD20 B Cells  Insomnia due to other mental disorder  Ataxia  Urinary dysfunction  Transverse myelitis (HCC) - Plan: Anti-MOG, Serum, MR CERVICAL SPINE W WO CONTRAST  Vitamin D deficiency - Plan: VITAMIN D 25 Hydroxy (Vit-D Deficiency, Fractures)  Attention deficit   1.  She will continue Ocrevus.  Check lab work.  There is a new assay for anti-MOG and I will check this as well as her initial MRI was always unusual with a longitudinally extensive transverse myelitis as well as classic appearing MS lesions in the brain. 2.    She is contemplating pregnancy.  If this occurs we will initiate Copaxone during the pregnancy and get her back on Ocrevus shortly after delivery  3.    Continue gabapentin to 300-300-600.  However, since her dysesthetic sensations are better she could try to lower the dose.    Continue low-dose metoprolol for tremor (high dose because hypertension).  Add Adderall for MS related attention deficit disorder 4.   Stay active and exercise as tolerated.   5.   Return in 6 months or sooner if there are new or worsening neurologic symptoms.   45-minute office visit with the majority of the time spent face-to-face for history and physical, discussion/counseling and decision-making.  Additional time with record review and documentation.  Deonta Bomberger A. Epimenio Foot, MD, [facility] 11/10/2021, 9:12 AM Certified in Neurology, Clinical Neurophysiology, Sleep Medicine and Neuroimaging  [facility] [address], [address] [place], [place] [address] [phone]

## 2021-11-10 LAB — ANTI-MOG, SERUM: MOG Antibody, Cell-based IFA: NEGATIVE

## 2021-11-10 LAB — IGG, IGA, IGM
IgA/Immunoglobulin A, Serum: 234 mg/dL (ref 87–352)
IgG (Immunoglobin G), Serum: 1048 mg/dL (ref 586–1602)
IgM (Immunoglobulin M), Srm: 31 mg/dL (ref 26–217)

## 2021-11-10 LAB — CD20 B CELLS
% CD19-B Cells: 0 % — ABNORMAL LOW (ref 4.6–22.1)
% CD20-B Cells: 0 % — ABNORMAL LOW (ref 5.0–22.3)

## 2021-11-10 LAB — VITAMIN D 25 HYDROXY (VIT D DEFICIENCY, FRACTURES): Vit D, 25-Hydroxy: 38.7 ng/mL (ref 30.0–100.0)

## 2021-11-13 ENCOUNTER — Telehealth: Payer: Self-pay | Admitting: Neurology

## 2021-11-13 ENCOUNTER — Telehealth: Payer: Self-pay | Admitting: *Deleted

## 2021-11-13 NOTE — Telephone Encounter (Signed)
Submitted PA on CMM. Key: [id number]. Waiting on determination from [place]. Received instant approval: "10/14/2021 to 11/13/2022."

## 2021-11-13 NOTE — Telephone Encounter (Signed)
self-pay sent to [facility] [phone] 

## 2021-11-19 ENCOUNTER — Other Ambulatory Visit: Payer: Self-pay | Admitting: Neurology

## 2021-11-20 ENCOUNTER — Other Ambulatory Visit: Payer: Self-pay | Admitting: Neurology

## 2021-11-22 ENCOUNTER — Other Ambulatory Visit: Payer: Self-pay | Admitting: Neurology

## 2021-11-22 ENCOUNTER — Telehealth: Payer: Self-pay | Admitting: Neurology

## 2021-11-22 NOTE — Telephone Encounter (Signed)
Last rx Vesicare sent in on 07/18/21 #30, 5 refills. Refills should be on file. Called [facility] [facility] at [phone]. Spoke w/ tech. She filled #90 07/18/21 and #37 on 10/14/21. They have qty 7 left that they can fill for her.   I called pt back at [phone]. Call could not be completed.  Called [phone]. Call could not be completed.  I sent mychart message to pt since unreachable by phone.

## 2021-11-22 NOTE — Telephone Encounter (Signed)
Pt is calling. Stated she has requested a refill for medication solifenacin (VESICARE) 10 MG tablet the pharmacy and was told to call office. Pt is requesting call back from nurse

## 2021-11-28 ENCOUNTER — Other Ambulatory Visit: Payer: Self-pay

## 2021-11-28 DIAGNOSIS — G35 Multiple sclerosis: Secondary | ICD-10-CM

## 2021-11-28 DIAGNOSIS — R269 Unspecified abnormalities of gait and mobility: Secondary | ICD-10-CM

## 2021-11-28 MED ORDER — DALFAMPRIDINE ER 10 MG PO TB12: 10.0000 mg | ORAL_TABLET | Freq: Two times a day (BID) | ORAL | 2 refills | Status: DC

## 2021-12-15 ENCOUNTER — Other Ambulatory Visit: Payer: Self-pay | Admitting: Neurology

## 2021-12-26 ENCOUNTER — Other Ambulatory Visit: Payer: Self-pay | Admitting: Neurology

## 2021-12-26 MED ORDER — AMPHETAMINE-DEXTROAMPHET ER 15 MG PO CP24: 15.0000 mg | ORAL_CAPSULE | ORAL | 0 refills | Status: DC

## 2021-12-26 NOTE — Telephone Encounter (Signed)
Pt is calling requesting a refill on medication  amphetamine-dextroamphetamine (ADDERALL XR) 15 MG 24 hr capsule. [phone]

## 2021-12-26 NOTE — Telephone Encounter (Signed)
Pt last seen 11/09/21 and next f/u 05/17/22. Per drug registry, last refilled 11/13/21 #30.

## 2022-01-02 ENCOUNTER — Telehealth: Payer: Self-pay | Admitting: *Deleted

## 2022-01-02 NOTE — Telephone Encounter (Signed)
Faxed PA dalfampridine to [employer] at [phone]. Received fax confirmation. Waiting on determination.

## 2022-01-03 NOTE — Telephone Encounter (Signed)
Received fax from [facility] that PA approved 12/03/21-01/02/23.

## 2022-01-28 ENCOUNTER — Other Ambulatory Visit: Payer: Self-pay | Admitting: Neurology

## 2022-01-29 NOTE — Telephone Encounter (Signed)
Last office visit on 10/2021 "Continue gabapentin to 300-300-600. "  Visit scheduled on 05/17/22

## 2022-02-02 ENCOUNTER — Ambulatory Visit
Admission: RE | Admit: 2022-02-02 | Discharge: 2022-02-02 | Disposition: A | Discharge status: Home / Self Care | Payer: Federal, State, Local not specified - PPO | Source: Ambulatory Visit | Attending: Neurology | Admitting: Neurology

## 2022-02-02 ENCOUNTER — Ambulatory Visit
Admission: RE | Admit: 2022-02-02 | Discharge: 2022-02-02 | Disposition: A | Discharge status: Home / Self Care | Payer: Self-pay | Source: Ambulatory Visit | Attending: Neurology | Admitting: Neurology

## 2022-02-02 DIAGNOSIS — G373 Acute transverse myelitis in demyelinating disease of central nervous system: Secondary | ICD-10-CM

## 2022-02-02 DIAGNOSIS — G35 Multiple sclerosis: Secondary | ICD-10-CM

## 2022-02-02 MED ORDER — GADOPICLENOL 0.5 MMOL/ML IV SOLN
7.0000 mL | Freq: Once | INTRAVENOUS | Status: AC | PRN
  Administered 2022-02-02: 7 mL via INTRAVENOUS

## 2022-02-13 ENCOUNTER — Other Ambulatory Visit: Payer: Self-pay | Admitting: Neurology

## 2022-02-13 MED ORDER — AMPHETAMINE-DEXTROAMPHET ER 15 MG PO CP24: 15.0000 mg | ORAL_CAPSULE | ORAL | 0 refills | Status: DC

## 2022-02-13 NOTE — Telephone Encounter (Signed)
Pt is needing a refill on her amphetamine-dextroamphetamine (ADDERALL XR) 15 MG 24 hr capsule [facility] [address]

## 2022-02-13 NOTE — Telephone Encounter (Signed)
Patient follow up visit scheduled on 05/16/22 with you.  Last filled on drug register is 12/26/21 #30 tablets.   Rx is pending for you to sign.

## 2022-02-14 ENCOUNTER — Telehealth: Payer: Self-pay | Admitting: Pharmacy Technician

## 2022-02-14 NOTE — Telephone Encounter (Signed)
Patient Advocate Encounter   Received notification that prior authorization for Modafinil 200MG  tablets is required.   PA submitted on 02/14/2022 Key [id number] Status is pending       Lyndel Safe, [place] Patient Advocate Specialist [place] Patient Advocate Team Direct Number: [phone]  Fax: [phone]

## 2022-02-14 NOTE — Telephone Encounter (Signed)
Patient Advocate Encounter  Prior Authorization for Modafinil 200MG  tablets has been approved.    PA# [id number] Effective dates: 02/14/2022 through 02/14/2023      Lyndel Safe, [place] Patient Advocate Specialist [place] Patient Advocate Team Direct Number: [phone]  Fax: [phone]

## 2022-02-16 ENCOUNTER — Other Ambulatory Visit (HOSPITAL_COMMUNITY): Payer: Self-pay

## 2022-02-26 ENCOUNTER — Other Ambulatory Visit: Payer: Self-pay | Admitting: Neurology

## 2022-02-26 DIAGNOSIS — F5105 Insomnia due to other mental disorder: Secondary | ICD-10-CM

## 2022-04-12 ENCOUNTER — Other Ambulatory Visit: Payer: Self-pay | Admitting: Neurology

## 2022-04-12 DIAGNOSIS — R5383 Other fatigue: Secondary | ICD-10-CM

## 2022-04-12 DIAGNOSIS — G35 Multiple sclerosis: Secondary | ICD-10-CM

## 2022-04-12 MED ORDER — AMPHETAMINE-DEXTROAMPHET ER 15 MG PO CP24: 15.0000 mg | ORAL_CAPSULE | ORAL | 0 refills | Status: DC

## 2022-04-12 NOTE — Telephone Encounter (Signed)
Pt is requesting a refill for amphetamine-dextroamphetamine (ADDERALL XR) 15 MG 24 hr capsule.  [id number]

## 2022-04-12 NOTE — Telephone Encounter (Signed)
Last seen on 11/09/21 Follow up scheduled on 05/29/2022 Last filled on 02/13/22 # 30 Rx pending to be signed

## 2022-05-04 ENCOUNTER — Emergency Department (HOSPITAL_COMMUNITY)
Admission: EM | Admit: 2022-05-04 | Discharge: 2022-05-04 | Disposition: A | Discharge status: Home / Self Care | Payer: Federal, State, Local not specified - PPO | Attending: Emergency Medicine | Admitting: Emergency Medicine

## 2022-05-04 ENCOUNTER — Other Ambulatory Visit: Payer: Self-pay

## 2022-05-04 ENCOUNTER — Emergency Department (HOSPITAL_COMMUNITY): Payer: Federal, State, Local not specified - PPO

## 2022-05-04 ENCOUNTER — Encounter (HOSPITAL_COMMUNITY): Payer: Self-pay

## 2022-05-04 DIAGNOSIS — R202 Paresthesia of skin: Secondary | ICD-10-CM | POA: Diagnosis present

## 2022-05-04 DIAGNOSIS — R253 Fasciculation: Secondary | ICD-10-CM | POA: Diagnosis not present

## 2022-05-04 LAB — CBC WITH DIFFERENTIAL/PLATELET
Abs Immature Granulocytes: 0 10*3/uL (ref 0.00–0.07)
Basophils Absolute: 0 10*3/uL (ref 0.0–0.1)
Basophils Relative: 1 %
Eosinophils Absolute: 0.1 10*3/uL (ref 0.0–0.5)
Eosinophils Relative: 2 %
HCT: 36.1 % (ref 36.0–46.0)
Hemoglobin: 12 g/dL (ref 12.0–15.0)
Immature Granulocytes: 0 %
Lymphocytes Relative: 37 %
Lymphs Abs: 1.4 10*3/uL (ref 0.7–4.0)
MCH: 31.6 pg (ref 26.0–34.0)
MCHC: 33.2 g/dL (ref 30.0–36.0)
MCV: 95 fL (ref 80.0–100.0)
Monocytes Absolute: 0.3 10*3/uL (ref 0.1–1.0)
Monocytes Relative: 9 %
Neutro Abs: 1.9 10*3/uL (ref 1.7–7.7)
Neutrophils Relative %: 51 %
Platelets: 245 10*3/uL (ref 150–400)
RBC: 3.8 MIL/uL — ABNORMAL LOW (ref 3.87–5.11)
RDW: 12.1 % (ref 11.5–15.5)
WBC: 3.7 10*3/uL — ABNORMAL LOW (ref 4.0–10.5)
nRBC: 0 % (ref 0.0–0.2)

## 2022-05-04 LAB — COMPREHENSIVE METABOLIC PANEL
ALT: 19 U/L (ref 0–44)
AST: 18 U/L (ref 15–41)
Albumin: 4.1 g/dL (ref 3.5–5.0)
Alkaline Phosphatase: 39 U/L (ref 38–126)
Anion gap: 6 (ref 5–15)
BUN: 7 mg/dL (ref 6–20)
CO2: 26 mmol/L (ref 22–32)
Calcium: 9.4 mg/dL (ref 8.9–10.3)
Chloride: 105 mmol/L (ref 98–111)
Creatinine, Ser: 0.77 mg/dL (ref 0.44–1.00)
GFR, Estimated: >60 mL/min (ref >60)
Glucose, Bld: 71 mg/dL (ref 70–99)
Potassium: 3.7 mmol/L (ref 3.5–5.1)
Sodium: 137 mmol/L (ref 135–145)
Total Bilirubin: 0.8 mg/dL (ref 0.3–1.2)
Total Protein: 6.6 g/dL (ref 6.5–8.1)

## 2022-05-04 LAB — URINALYSIS, ROUTINE W REFLEX MICROSCOPIC
Bilirubin Urine: NEGATIVE
Glucose, UA: NEGATIVE mg/dL
Hgb urine dipstick: NEGATIVE
Ketones, ur: NEGATIVE mg/dL
Nitrite: NEGATIVE
Protein, ur: NEGATIVE mg/dL
Specific Gravity, Urine: 1.012 (ref 1.005–1.030)
pH: 6 (ref 5.0–8.0)

## 2022-05-04 LAB — I-STAT BETA HCG BLOOD, ED (MC, WL, AP ONLY): I-stat hCG, quantitative: <5 m[IU]/mL (ref <5)

## 2022-05-04 LAB — MAGNESIUM: Magnesium: 2.1 mg/dL (ref 1.7–2.4)

## 2022-05-04 MED ORDER — GADOBUTROL 1 MMOL/ML IV SOLN
7.0000 mL | Freq: Once | INTRAVENOUS | Status: AC | PRN
  Administered 2022-05-04: 7 mL via INTRAVENOUS

## 2022-05-04 NOTE — ED Notes (Signed)
Pt is a&ox4, warm and dry to touch. Pt has MS and is complaining of tingling to left leg. PMS intact. No other complaints. Pt changed into a gown, covered with warm blanket, side rails up x 2, call light within reach. Pt attached to monitor/vitals.

## 2022-05-04 NOTE — ED Triage Notes (Signed)
Pt bib ems from work; c/o tingling in L thigh down to foot, describes as leg falling asleep, also in  L hand, started around 930 last night, took gabapentin last night, symptoms  resolved; started again 0800 this am, along with shaking; no dizziness, no ha; denies hx of same; 120/70, p 80, 100% RA, cbg 150; hx MS, appointment coming up for treatment

## 2022-05-04 NOTE — ED Provider Notes (Signed)
[place] EMERGENCY DEPARTMENT AT [facility] Provider Note   CSN: [id number] Arrival date & time: 05/04/22  1030     History  Chief Complaint  Patient presents with   Leg Pain    Pt complaining of tingling to left leg    Abigail Lewis is a 32 y.o. female.  Patient is a 32 year old female with a past medical history of multiple sclerosis presenting to the emergency department with paresthesias in her left leg.  Patient states that last night she started to develop paresthesias and she took her gabapentin and her symptoms improved.  She states that she went to work this morning and her symptoms returned and that her coworkers were concerned that her speech was additionally sounding slurred and recommended that she come to the emergency department.  She states that her leg also felt weak this morning but now her symptoms have improved.  She states that her leg is just been continuing to twitch.  She denies any pain.  She states that she has had similar symptoms with MS flares in the past.  She states that she is due for her MS injection this month which she states she gets every 6 months.  She denies any recent illnesses, fevers, nausea, vomiting or diarrhea, dysuria or hematuria.  The history is provided by the patient.  Leg Pain      Home Medications Prior to Admission medications   Medication Sig Start Date End Date Taking? Authorizing Provider  amphetamine-dextroamphetamine (ADDERALL XR) 15 MG 24 hr capsule Take 1 capsule by mouth every morning. 04/12/22   Sater, Pearletha Furl, MD  dalfampridine 10 MG TB12 Take 1 tablet (10 mg total) by mouth in the morning and at bedtime. 11/28/21   Sater, Pearletha Furl, MD  gabapentin (NEURONTIN) 300 MG capsule TAKE 1 CAPSULE BY MOUTH EVERY MORNING AND EVERY EVENING AND 2 CAPSULE AT BEDTIME 04/12/22   Sater, Pearletha Furl, MD  metoprolol succinate (TOPROL XL) 25 MG 24 hr tablet Take 1 tablet (25 mg total) by mouth daily. 04/26/20   Sater, Pearletha Furl,  MD  modafinil (PROVIGIL) 200 MG tablet TAKE 1 TABLETBY MOUTH EVERY MORNING. MAKE AN APT FOR REFILLS 04/12/22   Sater, Pearletha Furl, MD  ocrelizumab (OCREVUS) 300 MG/10ML injection Inject 600 mg into the vein every 6 (six) months. [facility].    [provider]  pravastatin (PRAVACHOL) 20 MG tablet Take 20 mg by mouth daily. 02/28/19   [provider]  sertraline (ZOLOFT) 50 MG tablet Take 1 tablet (50 mg total) by mouth daily. 03/13/19 04/12/19  Margie Ege A, DO  solifenacin (VESICARE) 10 MG tablet Take 1 tablet (10 mg total) by mouth daily. 07/18/21   Sater, Pearletha Furl, MD  traZODone (DESYREL) 50 MG tablet TAKE 1/2 TO 1 TABLET BY MOUTH DAILY AT BEDTIME 02/26/22   Sater, Pearletha Furl, MD  VITAMIN D PO Take 5,000 Units by mouth daily.    [provider]  Vitamin D, Ergocalciferol, (DRISDOL) 1.25 MG (50000 UNIT) CAPS capsule Take 1 capsule by mouth once a week. 05/04/20   [provider]      Allergies    Penicillins    Review of Systems   Review of Systems  Physical Exam Updated Vital Signs BP 100/71   Temp 98.1 F (36.7 C)   Resp 16  Physical Exam Vitals and nursing note reviewed.  Constitutional:      General: She is not in acute distress.    Appearance:  Normal appearance. She is not toxic-appearing.  HENT:     Head: Normocephalic and atraumatic.     Nose: Nose normal.     Mouth/Throat:     Mouth: Mucous membranes are moist.     Pharynx: Oropharynx is clear.  Eyes:     Extraocular Movements: Extraocular movements intact.     Conjunctiva/sclera: Conjunctivae normal.     Pupils: Pupils are equal, round, and reactive to light.  Cardiovascular:     Rate and Rhythm: Normal rate and regular rhythm.     Heart sounds: Normal heart sounds.  Pulmonary:     Effort: Pulmonary effort is normal.     Breath sounds: Normal breath sounds.  Abdominal:     General: Abdomen is flat.     Palpations: Abdomen is soft.     Tenderness: There is no abdominal  tenderness.  Musculoskeletal:        General: Normal range of motion.     Cervical back: Normal range of motion.     Comments: Left leg intermittently twitching and jumping on exam  Skin:    General: Skin is warm and dry.  Neurological:     General: No focal deficit present.     Mental Status: She is alert and oriented to person, place, and time.     Cranial Nerves: No cranial nerve deficit.     Sensory: No sensory deficit.     Motor: No weakness.     Coordination: Coordination normal.     Comments: No drift in all 4 extremities Normal speech  Psychiatric:        Mood and Affect: Mood normal.        Behavior: Behavior normal.     ED Results / Procedures / Treatments   Labs (all labs ordered are listed, but only abnormal results are displayed) Labs Reviewed  CBC WITH DIFFERENTIAL/PLATELET - Abnormal; Notable for the following components:      Result Value   WBC 3.7 (*)    RBC 3.80 (*)    All other components within normal limits  URINALYSIS, ROUTINE W REFLEX MICROSCOPIC - Abnormal; Notable for the following components:   APPearance HAZY (*)    Leukocytes,Ua MODERATE (*)    Bacteria, UA RARE (*)    All other components within normal limits  COMPREHENSIVE METABOLIC PANEL  MAGNESIUM  I-STAT BETA HCG BLOOD, ED ([facility], [facility], [facility] ONLY)    EKG None  Radiology MR THORACIC SPINE W WO CONTRAST  Result Date: 05/04/2022 CLINICAL DATA:  Multiple sclerosis. Paresthesias in the left lower extremity. EXAM: MRI THORACIC WITHOUT AND WITH CONTRAST TECHNIQUE: Multiplanar and multiecho pulse sequences of the thoracic spine were obtained without and with intravenous contrast. CONTRAST:  7mL GADAVIST GADOBUTROL 1 MMOL/ML IV SOLN COMPARISON:  Of the thoracic spine without and with contrast 03/09/19 FINDINGS: Alignment: No significant listhesis is present. Thoracic kyphosis is stable. Vertebrae: Marrow signal and vertebral body heights are normal. Cord: Diffuse central T2 signal hyperintensity  throughout the thoracic spine is similar the prior study. No abnormal enhancement cord expansion is associated. Is no significant interval change. Paraspinal and other soft tissues: The visualized lung fields clear. Paraspinous musculature is within normal limits. The visualized upper abdomen is normal. Disc levels: No focal disc protrusion or stenosis is present in the thoracic spine. IMPRESSION: 1. Stable diffuse central T2 signal hyperintensity throughout the thoracic spinal cord without abnormal enhancement or expansion. This is consistent with the given diagnosis of multiple sclerosis. 2. No acute or  focal lesion to explain the patient's symptoms. Electronically Signed   By: Marin Roberts M.D.   On: 05/04/2022 14:12   MR Cervical Spine W and Wo Contrast  Result Date: 05/04/2022 CLINICAL DATA:  Multiple sclerosis. Paresthesias in the left lower extremity. EXAM: MRI CERVICAL SPINE WITHOUT AND WITH CONTRAST TECHNIQUE: Multiplanar and multiecho pulse sequences of the cervical spine, to include the craniocervical junction and cervicothoracic junction, were obtained without and with intravenous contrast. CONTRAST:  62mL GADAVIST GADOBUTROL 1 MMOL/ML IV SOLN COMPARISON:  MR of the cervical spine without and with contrast 02/02/2022 10/20/2019 FINDINGS: Alignment: No significant listhesis is present. Vertebrae: Marrow signal and vertebral body heights are normal. Cord: Confluent T2 hyperintensity beginning in the medulla extends inferiorly the level of C4-5. Right-sided T2 signal hyperintensity is present at C5 and C6. Abnormal signal within the central cord in the upper thoracic spine is similar the prior studies as well. No pathologic enhancement is associated. Cord is not expanded. Posterior Fossa, vertebral arteries, paraspinal tissues: The craniocervical junction is otherwise within normal limits. Is present in the vertebral arteries bilaterally. Disc levels: No focal disc protrusion or stenosis is  present. IMPRESSION: 1. Stable appearance of confluent T2 hyperintensity beginning in the medulla extends inferiorly the level of C4-5 compatible with clinical diagnosis multiple sclerosis. 2. Stable right-sided T2 signal hyperintensity at C5 and C6. 3. Abnormal signal within the central cord in the upper thoracic spine is similar the prior studies as well. 4. No pathologic enhancement to suggest active demyelination. 5. No significant disc protrusion or stenosis. Electronically Signed   By: Marin Roberts M.D.   On: 05/04/2022 14:08   MR Brain W and Wo Contrast  Result Date: 05/04/2022 CLINICAL DATA:  Multiple sclerosis. Paresthesias in the left extremity. EXAM: MRI HEAD WITHOUT AND WITH CONTRAST TECHNIQUE: Multiplanar, multiecho pulse sequences of the brain and surrounding structures were obtained without and with intravenous contrast. CONTRAST:  32mL GADAVIST GADOBUTROL 1 MMOL/ML IV SOLN COMPARISON:  MR head without and contrast 02/02/2022 FINDINGS: Brain: No acute infarct, hemorrhage, or mass lesion is present. Multiple periventricular and subcortical T2 hyperintensities are similar the prior exams. No restricted diffusion or enhancement is associated with the lesions. No new lesions are present. The ventricles are of normal size. No significant extraaxial fluid collection is present. T2 hyperintense lesions in the anterior and right paramidline medulla are stable. No new lesions are present the brainstem. A lesion in the right brachium pontis is stable. The internal auditory canals are within normal limits. 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 Vascular: Flow is present in the major intracranial arteries. Skull and upper cervical spine: The craniocervical junction is normal. Upper cervical spine is within normal limits. Marrow signal is unremarkable. Sinuses/Orbits: The paranasal sinuses and  mastoid air cells are clear. The globes and orbits are within normal limits. IMPRESSION: 1. Stable appearance of multifocal periventricular and subcortical T2 hyperintensities bilaterally. The finding is consistent with the given history of multiple sclerosis. The lesions are stable. No evidence for acute demyelination. 2. No acute intracranial abnormality or significant interval change. Electronically Signed   By: Marin Roberts M.D.   On: 05/04/2022 13:46    Procedures Procedures    Medications Ordered in ED Medications  gadobutrol (GADAVIST) 1 MMOL/ML injection 7 mL (7 mLs Intravenous Contrast Given 05/04/22 1332)    ED Course/ Medical Decision Making/ A&P Clinical Course as of 05/04/22 1420  Fri  May 04, 2022  1110 I spoke with Dr. Selina Cooley of neurology who recommended MRI brain, C-spine and T-spine w/ and w/o contrast. They will plan to see with further recommendations if she has new active lesions. [VK]  1416 Patient has no new lesions on MRI. She is stable for discharge home with outpatient neurology follow up. [VK]    Clinical Course User Index [VK] Rexford Maus, DO                             Medical Decision Making This patient presents to the ED with chief complaint(s) of L leg paresthesias and weakness with pertinent past medical history of multiple sclerosis which further complicates the presenting complaint. The complaint involves an extensive differential diagnosis and also carries with it a high risk of complications and morbidity.    The differential diagnosis includes MS flare, patient has no current neurologic deficits making CVA unlikely, considering TIA, electrolyte derangement, infection  Additional history obtained: Additional history obtained from N/A Records reviewed outpatient neurology records  ED Course and Reassessment: On patient's arrival to the emergency department she is well-appearing in no acute distress.  She has no focal neurologic  deficits on exam at this time and symptoms started greater than 12 hours ago she is outside of the stroke window and stroke alert was not called.  She will have labs performed to evaluate for causes of her paresthesias.  Neurology will be consulted for recommendations on imaging for concern for MS flare.  Independent labs interpretation:  The following labs were independently interpreted: within normal range  Independent visualization of imaging: - I independently visualized the following imaging with scope of interpretation limited to determining acute life threatening conditions related to emergency care: MRI brain/c-spine/t-spine, which revealed no acute abnormalities  Consultation: - Consulted or discussed management/test interpretation w/ external professional: neurology  Consideration for admission or further workup: Patient has no emergent conditions requiring admission or further work-up at this time and is stable for discharge home with neurology follow-up  Social Determinants of health: N/A    Amount and/or Complexity of Data Reviewed Labs: ordered. Radiology: ordered.  Risk Prescription drug management.          Final Clinical Impression(s) / ED Diagnoses Final diagnoses:  Left leg paresthesias    Rx / DC Orders ED Discharge Orders     None         Rexford Maus, DO 05/04/22 1420

## 2022-05-04 NOTE — Discharge Instructions (Addendum)
You were seen in the emergency department for your tingling in your leg.  Your workup showed no signs of stroke or new MS lesions and no signs of infection or abnormal electrolytes.  Is unclear what is causing your symptoms at this time but you can continue to take your normal home medications as prescribed and should follow-up with neurology as an outpatient to have your symptoms rechecked.  You should return to the emergency department if you are having worsening weakness that you are unable to walk, you are unable to talk, you are having fevers or if you have any other new or concerning symptoms.

## 2022-05-04 NOTE — ED Notes (Signed)
Sandwich, cranberry juice and lemon lime drink provided.

## 2022-05-17 ENCOUNTER — Ambulatory Visit: Payer: Self-pay | Admitting: Neurology

## 2022-05-29 ENCOUNTER — Ambulatory Visit: Payer: Federal, State, Local not specified - PPO | Admitting: Neurology

## 2022-05-29 ENCOUNTER — Other Ambulatory Visit: Payer: Self-pay | Admitting: Neurology

## 2022-05-29 MED ORDER — AMPHETAMINE-DEXTROAMPHET ER 15 MG PO CP24: 15.0000 mg | ORAL_CAPSULE | ORAL | 0 refills | Status: DC

## 2022-05-29 NOTE — Telephone Encounter (Signed)
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

## 2022-05-29 NOTE — Addendum Note (Signed)
Addended by: Aura Camps on: 05/29/2022 04:44 PM   Modules accepted: Orders

## 2022-05-29 NOTE — Telephone Encounter (Signed)
At 3:09 pt left vm asking for a refill on her ADDERALL , she asked for a call back.  Phone rep called pt to confirm the pharmacy she wanted the refill sent to, the phone rang, no option to leave a vm.  Message came on stating the person you are trying to reach is unavailable, please try your call later.

## 2022-06-29 ENCOUNTER — Other Ambulatory Visit: Payer: Self-pay | Admitting: Neurology

## 2022-07-05 ENCOUNTER — Telehealth: Payer: Self-pay | Admitting: *Deleted

## 2022-07-05 DIAGNOSIS — Z0289 Encounter for other administrative examinations: Secondary | ICD-10-CM

## 2022-07-05 NOTE — Telephone Encounter (Signed)
Gave completed/signed FMLA back to medical records to process for pt. 

## 2022-08-26 ENCOUNTER — Other Ambulatory Visit: Payer: Self-pay | Admitting: Neurology

## 2022-08-26 DIAGNOSIS — F99 Mental disorder, not otherwise specified: Secondary | ICD-10-CM

## 2022-08-27 DIAGNOSIS — Z0289 Encounter for other administrative examinations: Secondary | ICD-10-CM

## 2022-08-27 NOTE — Telephone Encounter (Signed)
Last seen on 11/10/22 Follow up scheduled on 08/30/22

## 2022-08-28 ENCOUNTER — Telehealth: Payer: Self-pay | Admitting: *Deleted

## 2022-08-28 NOTE — Telephone Encounter (Signed)
Pt medical form faxed on 08/28/2022

## 2022-08-30 ENCOUNTER — Telehealth: Payer: Self-pay | Admitting: Neurology

## 2022-08-30 ENCOUNTER — Other Ambulatory Visit: Payer: Self-pay | Admitting: Neurology

## 2022-08-30 ENCOUNTER — Ambulatory Visit: Payer: Federal, State, Local not specified - PPO | Admitting: Neurology

## 2022-08-30 NOTE — Telephone Encounter (Signed)
Called both numbers on file, LVM asking pt to call back to reschedule 08/30/22 appt due to inclement weather

## 2022-08-31 ENCOUNTER — Other Ambulatory Visit: Payer: Self-pay | Admitting: Neurology

## 2022-08-31 ENCOUNTER — Telehealth: Payer: Self-pay | Admitting: Neurology

## 2022-08-31 DIAGNOSIS — G35D Multiple sclerosis, unspecified: Secondary | ICD-10-CM

## 2022-08-31 DIAGNOSIS — G35 Multiple sclerosis: Secondary | ICD-10-CM

## 2022-08-31 DIAGNOSIS — R269 Unspecified abnormalities of gait and mobility: Secondary | ICD-10-CM

## 2022-08-31 NOTE — Telephone Encounter (Addendum)
Pt is requesting a refill for amphetamine-dextroamphetamine (ADDERALL XR) 15 MG 24 hr capsule.  Pharmacy: [id number]  And Pt is requesting a refill for dalfampridine 10 MG TB12 .  Pharmacy: [facility]

## 2022-08-31 NOTE — Telephone Encounter (Signed)
Pt is asking for a call to discuss her need of a handicap placard for work

## 2022-09-03 MED ORDER — DALFAMPRIDINE ER 10 MG PO TB12: 10.0000 mg | ORAL_TABLET | Freq: Two times a day (BID) | ORAL | 5 refills | Status: DC

## 2022-09-03 MED ORDER — AMPHETAMINE-DEXTROAMPHET ER 15 MG PO CP24: 15.0000 mg | ORAL_CAPSULE | ORAL | 0 refills | Status: DC

## 2022-09-03 NOTE — Telephone Encounter (Signed)
I called patient to discuss. No answer, no VM available.

## 2022-09-03 NOTE — Telephone Encounter (Signed)
Last seen on 11/09/21 Follow up scheduled on 03/06/23 Last filled on 05/29/22 #30 tablets (30 day supply) Rx pending to be signed   I have sent in the dalfampridine already.

## 2022-09-04 ENCOUNTER — Other Ambulatory Visit: Payer: Self-pay | Admitting: Neurology

## 2022-09-04 ENCOUNTER — Encounter: Payer: Self-pay | Admitting: Neurology

## 2022-09-04 ENCOUNTER — Telehealth: Payer: Self-pay | Admitting: Neurology

## 2022-09-04 DIAGNOSIS — N3000 Acute cystitis without hematuria: Secondary | ICD-10-CM

## 2022-09-04 MED ORDER — NITROFURANTOIN MONOHYD MACRO 100 MG PO CAPS: ORAL_CAPSULE | ORAL | 0 refills | Status: DC

## 2022-09-04 NOTE — Telephone Encounter (Addendum)
Last seen on 11/09/21 Follow up scheduled on 03/06/23 Rx for Adderall was sent on 08/31/22.  Dr.Sater states she has UTI, frequent urination, burning is requesting Rx for this. I explained an office visit with PCP is best so a urine can be checked, pt said PCP declined to send in Rx and she can't schedule a visit with her right now. Pt asked if you will send in Rx for UTI? States you have done this in the past before?   Please advise

## 2022-09-04 NOTE — Telephone Encounter (Signed)
Pt called stating that she is needing a refill on her amphetamine-dextroamphetamine (ADDERALL XR) 15 MG 24 hr capsule and also she is wanting to discuss with RN medication for her UTI. Please advise.

## 2022-11-03 ENCOUNTER — Other Ambulatory Visit: Payer: Self-pay | Admitting: Neurology

## 2022-11-03 DIAGNOSIS — R5383 Other fatigue: Secondary | ICD-10-CM

## 2022-11-03 DIAGNOSIS — G35 Multiple sclerosis: Secondary | ICD-10-CM

## 2022-11-05 NOTE — Telephone Encounter (Signed)
Last seen on 11/09/21 Follow up scheduled up 03/06/23 Last filled on 10/04/22 #30 tablets (30 day supply) Rx pending to be signed

## 2022-11-13 ENCOUNTER — Other Ambulatory Visit: Payer: Self-pay | Admitting: Neurology

## 2022-11-13 MED ORDER — AMPHETAMINE-DEXTROAMPHET ER 15 MG PO CP24: 15.0000 mg | ORAL_CAPSULE | ORAL | 0 refills | Status: DC

## 2022-11-13 NOTE — Telephone Encounter (Signed)
Last seen on 11/09/21 Follow up scheduled on 03/06/23 Last filled on 08/23/22 #30 tablets (30 day supply) Rx pending to be signed

## 2022-11-13 NOTE — Telephone Encounter (Signed)
Pt request refill for amphetamine-dextroamphetamine (ADDERALL XR) 15 MG 24 hr capsule send to  [id number]

## 2022-11-20 ENCOUNTER — Telehealth: Payer: Self-pay | Admitting: *Deleted

## 2022-11-30 ENCOUNTER — Other Ambulatory Visit: Payer: Self-pay | Admitting: Neurology

## 2022-11-30 DIAGNOSIS — F5105 Insomnia due to other mental disorder: Secondary | ICD-10-CM

## 2022-12-04 ENCOUNTER — Other Ambulatory Visit: Payer: Self-pay | Admitting: Neurology

## 2022-12-05 NOTE — Telephone Encounter (Signed)
Last seen on 11/09/21 Follow up scheduled on 03/06/23

## 2023-03-06 ENCOUNTER — Encounter: Payer: Self-pay | Admitting: Neurology

## 2023-03-06 ENCOUNTER — Ambulatory Visit: Payer: Federal, State, Local not specified - PPO | Admitting: Neurology

## 2023-03-06 ENCOUNTER — Other Ambulatory Visit: Payer: Self-pay | Admitting: Neurology

## 2023-03-06 VITALS — Ht 62.0 in | Wt 150.5 lb

## 2023-03-06 DIAGNOSIS — F99 Mental disorder, not otherwise specified: Secondary | ICD-10-CM

## 2023-03-06 DIAGNOSIS — E559 Vitamin D deficiency, unspecified: Secondary | ICD-10-CM | POA: Diagnosis not present

## 2023-03-06 DIAGNOSIS — R39198 Other difficulties with micturition: Secondary | ICD-10-CM

## 2023-03-06 DIAGNOSIS — R269 Unspecified abnormalities of gait and mobility: Secondary | ICD-10-CM

## 2023-03-06 DIAGNOSIS — G35 Multiple sclerosis: Secondary | ICD-10-CM

## 2023-03-06 DIAGNOSIS — F5105 Insomnia due to other mental disorder: Secondary | ICD-10-CM

## 2023-03-06 DIAGNOSIS — Z79899 Other long term (current) drug therapy: Secondary | ICD-10-CM

## 2023-03-06 DIAGNOSIS — E785 Hyperlipidemia, unspecified: Secondary | ICD-10-CM

## 2023-03-06 DIAGNOSIS — R41840 Attention and concentration deficit: Secondary | ICD-10-CM

## 2023-03-06 MED ORDER — AMPHETAMINE-DEXTROAMPHET ER 15 MG PO CP24: 15.0000 mg | ORAL_CAPSULE | ORAL | 0 refills | Status: DC

## 2023-03-06 MED ORDER — TRAZODONE HCL 50 MG PO TABS: ORAL_TABLET | ORAL | 3 refills | Status: AC

## 2023-03-06 NOTE — Progress Notes (Addendum)
 Disability placement card form completed, placed in outgoing mail patient

## 2023-03-06 NOTE — Progress Notes (Addendum)
 [facility]  PATIENT: Abigail Lewis DOB: [birth year]  REFERRING DOCTOR OR PCP:   Dr. McComb (Ob/Gyn); [facility] (PCP) SOURCE: Notes from recent emergency room and hospital stay and neurology, imaging and lab reports, MRI images personally reviewed.  _________________________________   HISTORICAL  CHIEF COMPLAINT:  Chief Complaint  Patient presents with   Room 10    Pt is here Alone. Pt states that she has been doing well since her last appointment. Pt states that her Aderrall makes her lose her appetite and would like to discuss a new medication. Pt would like to see a Dermatologist. Pt wants to discuss her lab results.     HISTORY OF PRESENT ILLNESS:  Abigail Lewis is a 33 y.o. woman with relapsing remitting MS.  Update 03/06/2023 She is on Ocrevus.  She tolerates it well.   She had a one week long URI but otherwise no infections.    She has no recent exacerbation or new symptoms.   11/09/2021 IgG and IgM were normal.   Last MRI was from 2021 and showed 1 lesion not present on the previous MRI, prompting initiation of Ocrevus.  She is walking better and can now go two miles without any assistive device.   Ampyra  has helped her gait and balance - she stopped a week and noted a difference so went back on..    She notes that her legs seem stronger since starting dalfampridine .    Metoprolol  may have helped some.   Bladder function is usually ok on Vesicare  and she has rare urge incontinence.  She has had some hesitancy but more urgency issues.  She has bowel issues and had fecal incontinence very rarely.     Vision is fine.  Recent eye appt showed no evidence of ON.    She notes some fatigue. Sleep is variable. Some benefit from modafinil  but it seems to be helping less than last year. She has insomnia but no worse, worse with sleep maintenance. She notes some anxiety but no depression   She is on sertraline  and takes trazodone  at bedtime.  She has reduced  cognitive fog with focus/attention.  MRI images are unusual.   She presented with  a longitudinal lesion on cervical spine MRI.  However, anti-NMO and anti-MOG were both negative. She had multiple other more classic appearing spine and brain MS lesions including some of the brain that enhanced.   Additionally she had oligoclonal bands in the CSF.  Therefore, MS is more likely.   Since seronegative NMOSD not ruled out, we opted for a B cell depleter as therapy.   She was low positive for SSA (less than 0.9 is normal and she was 1.1)  She does not have dry eyes or mouth or other manifestations.   The spinal cord plaque was LETM  She is on vitamin D  supplements   She is exercising some and even went jogging a short distance.     She stopped metoprolol  as tremors were not too bad and BP was low  MRI brain, cervical and thoracic spine 05/04/2022 showed no new symptoms  She is contemplating pregnancy.   We discussed that Ocrevus could have some effect on fetal hematology during 2nd half pregnancy and we could consider change to copaxone if she does become pregnant.    MS history:  In 2018 she noted that her legs would get numb and achy.   Symptoms fluctuated but persisted through 2019 and 2020.  She saw Dr.  Patel at [facility] neurology in December 2019 and again for an EMG October 2020 which was normal.  In August 2020 she began to experience lightheadedness with dizziness but not vertigo.  In October, she was noting more issues with her legs experiencing weakness and clumsiness.    She had no falls but started to use a walker due to poor gait.    Symptoms progressed over the rest of 2020.  In January, her left hand started to have a tremor.    In early February 2021, she was unable to walk and needed to crawl down the stairs.   Her voice was slurred.  She had mild right visual changes.   She went to the [facility] ED and MRI's of the brain and cervical spine were concerning for a demyelinating disorder.   She  received 5 days of IV Solu-Medrol  with some improvement. Sh e continued to improve over the next few months.  Laboratory tests: While in the hospital, she had a lumbar puncture showing 5 oligoclonal bands and elevated IgG index .   Labwork showed positive ANA with elevated chromatin Ab and negative SSA/SSB initially and then milldy increased SSA 2 days later, negative SPEP/IEF, negative anti-NMO.   She is HIV negative.   We subsequently checked the [facility] lab anti-NMO and MOG and they were negative.    Imaging:  MRIs of the brain, cervical spine and thoracic spine performed between February 13 and March 09, 2019.  The MRI of the brain shows multiple T2/FLAIR hyperintense foci in the cerebellum, right middle cerebellar peduncle, brainstem including periaqueductal gray and hemispheres.  Some of the foci enhanced after contrast.  Additionally, there appeared to be enhancement within the left optic nerve MRI of the cervical spine showed a longitudinal lesion from C1-C2 throughC4 and patchy lesions elsewhere in the lower cervical and upper thoracic spinal cord.  MRI of the thoracic spine showed patchy lesions throughout the spinal cord.  There was no abnormal enhancement in the spinal cord.  MRI brain 10/20/2019 shows multiple T2/FLAIR hyperintense foci in the cerebellum, brainstem, thalamus and hemispheres in a pattern and configuration consistent with chronic demyelinating plaque associated with multiple sclerosis.  None of the foci enhances or appears to be acute.  There appears to be 1 nonenhancing focus in the cerebellum that was not present on the 03/07/2019 MRI.  All the foci that enhanced on the earlier brain MRI no longer do so.      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  MRI cervical spine 10/20/2019 shows Multiple T2 hyperintense foci within the brainstem, cerebellum and spinal cord in a pattern and configuration consistent with chronic demyelinating plaque associated with multiple  sclerosis.  None of the foci appear to be acute.  They did not enhance.  The spinal cord lesions were all present on the 03/07/2019 MRI.  MRI brain, cervical and thoracic spine 05/04/2022 showed no new symptoms    REVIEW OF SYSTEMS: Constitutional: No fevers, chills, sweats, or change in appetite Eyes: No visual changes, double vision, eye pain Ear, nose and throat: No hearing loss, ear pain, nasal congestion, sore throat Cardiovascular: No chest pain, palpitations Respiratory:  No shortness of breath at rest or with exertion.   No wheezes GastrointestinaI: No nausea, vomiting, diarrhea, abdominal pain, fecal incontinence Genitourinary:  No dysuria, urinary retention or frequency.  No nocturia. Musculoskeletal:  No neck pain, back pain Integumentary: No rash, pruritus, skin lesions Neurological: as above Psychiatric: She has had anxiety and  depression. Endocrine: No palpitations, diaphoresis, change in appetite, change in weigh or increased thirst Hematologic/Lymphatic:  No anemia, purpura, petechiae. Allergic/Immunologic: No itchy/runny eyes, nasal congestion, recent allergic reactions, rashes  ALLERGIES: Allergies  Allergen Reactions   Penicillins Swelling    HOME MEDICATIONS:  Current Outpatient Medications:    dalfampridine  10 MG TB12, Take 1 tablet (10 mg total) by mouth in the morning and at bedtime., Disp: 60 tablet, Rfl: 5   [place]reek 610 MG CAPS, Take by mouth., Disp: , Rfl:    gabapentin  (NEURONTIN ) 300 MG capsule, TAKE 1 CAPSULE BY MOUTH EVERY MORNING AND EVERY EVENING AND 2 CAPSULE AT BEDTIME, Disp: 120 capsule, Rfl: 2   Maca Root 500 MG CAPS, Take by mouth., Disp: , Rfl:    modafinil  (PROVIGIL ) 200 MG tablet, TAKE 1 TABLETBY MOUTH EVERY MORNING. MAKE AN APT FOR REFILLS, Disp: 30 tablet, Rfl: 5   ocrelizumab (OCREVUS) 300 MG/10ML injection, Inject 600 mg into the vein every 6 (six) months. [facility]., Disp: , Rfl:    pravastatin  (PRAVACHOL ) 20 MG tablet, Take 20 mg  by mouth daily., Disp: , Rfl:    saw palmetto 500 MG capsule, Take 500 mg by mouth daily., Disp: , Rfl:    solifenacin  (VESICARE ) 10 MG tablet, Take 1 tablet (10 mg total) by mouth daily., Disp: 30 tablet, Rfl: 5   Vitamin D , Ergocalciferol , (DRISDOL ) 1.25 MG (50000 UNIT) CAPS capsule, Take 1 capsule by mouth once a week., Disp: , Rfl:    amphetamine -dextroamphetamine (ADDERALL XR) 15 MG 24 hr capsule, Take 1 capsule by mouth every morning., Disp: 30 capsule, Rfl: 0   sertraline  (ZOLOFT ) 50 MG tablet, Take 1 tablet (50 mg total) by mouth daily., Disp: 30 tablet, Rfl: 0   traZODone  (DESYREL ) 50 MG tablet, TAKE 1/2 TO 1 TABLET BY MOUTH DAILY AT BEDTIME, Disp: 90 tablet, Rfl: 3   VITAMIN D  PO, Take 5,000 Units by mouth daily. (Patient not taking: Reported on 03/06/2023), Disp: , Rfl:   PAST MEDICAL HISTORY: Past Medical History:  Diagnosis Date   ANA positive 02/13/2019   Anxiety    Depression    Hyperlipidemia 12/03/2018   LDL=155   Multiple sclerosis, relapsing-remitting (HCC)     PAST SURGICAL HISTORY: Past Surgical History:  Procedure Laterality Date   NO PAST SURGERIES     TOOTH EXTRACTION      FAMILY HISTORY: Family History  Problem Relation Age of Onset   Stroke Mother    Diabetes Father    Healthy Sister    Autism Brother    Multiple sclerosis Neg Hx     SOCIAL HISTORY:  Social History   Socioeconomic History   Marital status: Single    Spouse name: Not on file   Number of children: 0   Years of education: 12   Highest education level: Not on file  Occupational History   Occupation: [employer], [employer] office  Tobacco Use   Smoking status: Never   Smokeless tobacco: Never  Vaping Use   Vaping status: Never Used  Substance and Sexual Activity   Alcohol use: Not Currently    Comment: wine sometimes    Drug use: No   Sexual activity: Yes    Birth control/protection: Pill  Other Topics Concern   Not on file  Social History Narrative   Right handed     Caffeine use: sometimes   She works as a Office manager and part-time as Lawyer.   She lives alone.  No children.  Social Drivers of Corporate investment banker Strain: Not on file  Food Insecurity: No Food Insecurity (12/27/2021)   Received from [facility], [facility]   Hunger Vital Sign    Worried About Running Out of Food in the Last Year: Never true    Ran Out of Food in the Last Year: Never true  Transportation Needs: Not on file  Physical Activity: Not on file  Stress: Not on file  Social Connections: Unknown (05/29/2021)   [facility], [facility]   Social Network    Social Network: Not on file  Intimate Partner Violence: Unknown (04/24/2021)   Received from [facility], [facility]   HITS    Physically Hurt: Not on file    Insult or Talk Down To: Not on file    Threaten Physical Harm: Not on file    Scream or Curse: Not on file     PHYSICAL EXAM  Vitals:   03/06/23 0825  Weight: 150 lb 8 oz (68.3 kg)  Height: 5' 2 (1.575 m)     Body mass index is 27.53 kg/m.  No results found.   General: The patient is well-developed and well-nourished and in no acute distress  HEENT:  Head is [place]/AT.  Sclera are anicteric.     Skin: Extremities are without rash or  edema.   Neurologic Exam  Mental status: The patient is alert and oriented x 3 at the time of the examination. The patient has apparent normal recent and remote memory, with an apparently normal attention span and concentration ability.   Speech is normal.  Cranial nerves: Extraocular movements are full.    Color vision is symmetric.  Facial strength is normal.  Trapezius and sternocleidomastoid strength is normal. No dysarthria is noted.    No obvious hearing deficits are noted.  Motor:  Muscle bulk is normal.   Tone is normal. Strength is  5 / 5 in all 4 extremities.   Sensory: She had intact sensation to touch, temperature and vibration.  Coordination: She has normal  finger-nose-finger and reduced left heel-to-shin..   Gait and station: Station is normal.  Gait is mildly wide.  Tandem gait is reduced but better than last year.   Romberg is negative now.   Reflexes: Deep tendon reflexes are symmetric and normal in arms and increased at the legs.  There is no ankle clonus.SABRA          DIAGNOSTIC DATA (LABS, IMAGING, TESTING) - I reviewed patient records, labs, notes, testing and imaging myself where available.  Lab Results  Component Value Date   WBC 3.7 (L) 05/04/2022   HGB 12.0 05/04/2022   HCT 36.1 05/04/2022   MCV 95.0 05/04/2022   PLT 245 05/04/2022      Component Value Date/Time   NA 137 05/04/2022 1058   K 3.7 05/04/2022 1058   CL 105 05/04/2022 1058   CO2 26 05/04/2022 1058   GLUCOSE 71 05/04/2022 1058   BUN 7 05/04/2022 1058   CREATININE 0.77 05/04/2022 1058   CALCIUM 9.4 05/04/2022 1058   PROT 6.6 05/04/2022 1058   PROT 6.8 10/13/2019 1349   ALBUMIN 4.1 05/04/2022 1058   ALBUMIN 4.6 10/13/2019 1349   AST 18 05/04/2022 1058   ALT 19 05/04/2022 1058   ALKPHOS 39 05/04/2022 1058   BILITOT 0.8 05/04/2022 1058   BILITOT 0.3 10/13/2019 1349   GFRNONAA >60 05/04/2022 1058   GFRAA >60 03/12/2019 0219   Lab Results  Component  Value Date   CHOL 207 (H) 07/12/2016   HDL 61 07/12/2016   LDLCALC 137 (H) 07/12/2016   TRIG 43 07/12/2016   CHOLHDL 3.4 07/12/2016   Lab Results  Component Value Date   HGBA1C 5.1 03/10/2019   Lab Results  Component Value Date   VITAMINB12 894 03/08/2019   Lab Results  Component Value Date   TSH 2.212 03/08/2019       ASSESSMENT AND PLAN  Multiple sclerosis (HCC) - Plan: Lipid panel, IgG, IgA, IgM, CBC with Differential/Platelet, Comprehensive metabolic panel  Gait abnormality  Vitamin D  deficiency - Plan: VITAMIN D  25 Hydroxy (Vit-D Deficiency, Fractures)  High risk medication use - Plan: Lipid panel, IgG, IgA, IgM, CBC with Differential/Platelet, Comprehensive metabolic  panel  Attention deficit  Urinary dysfunction  Hyperlipidemia, unspecified hyperlipidemia type - Plan: Lipid panel  Insomnia due to other mental disorder - Plan: traZODone  (DESYREL ) 50 MG tablet   1.  Continue Ocrevus.  Check lab work.   2.    If she becomes pregnant, we will initiate Copaxone during the pregnancy and get her back on Ocrevus shortly after delivery  3.    Continue gabapentin  to 300-300-600.  However, since her dysesthetic sensations are better she could try to lower the dose.      4.   Stay active and exercise as tolerated.   5.   FMLA forms  6.  We can refill Adderall if needed Return in 6 months or sooner if there are new or worsening neurologic symptoms.   42-minute office visit with the majority of the time spent face-to-face for history and physical, discussion/counseling and decision-making.  Additional time with record review and documentation.  Carloyn Lahue A. Vear, MD, [facility] 03/06/2023, 9:06 AM Certified in Neurology, Clinical Neurophysiology, Sleep Medicine and Neuroimaging  [facility] [address] [place], [place] [address] [phone]

## 2023-03-07 ENCOUNTER — Telehealth: Payer: Self-pay | Admitting: *Deleted

## 2023-03-07 ENCOUNTER — Encounter: Payer: Self-pay | Admitting: Neurology

## 2023-03-07 LAB — COMPREHENSIVE METABOLIC PANEL
ALT: 28 [IU]/L (ref 0–32)
AST: 19 [IU]/L (ref 0–40)
Albumin: 4.5 g/dL (ref 3.9–4.9)
Alkaline Phosphatase: 51 [IU]/L (ref 44–121)
BUN/Creatinine Ratio: 11 (ref 9–23)
BUN: 9 mg/dL (ref 6–20)
Bilirubin Total: 0.3 mg/dL (ref 0.0–1.2)
CO2: 23 mmol/L (ref 20–29)
Calcium: 9.6 mg/dL (ref 8.7–10.2)
Chloride: 103 mmol/L (ref 96–106)
Creatinine, Ser: 0.82 mg/dL (ref 0.57–1.00)
Globulin, Total: 2.2 g/dL (ref 1.5–4.5)
Glucose: 76 mg/dL (ref 70–99)
Potassium: 4.4 mmol/L (ref 3.5–5.2)
Sodium: 137 mmol/L (ref 134–144)
Total Protein: 6.7 g/dL (ref 6.0–8.5)
eGFR: 96 mL/min/{1.73_m2} (ref >59)

## 2023-03-07 LAB — CBC WITH DIFFERENTIAL/PLATELET
Basophils Absolute: 0 10*3/uL (ref 0.0–0.2)
Basos: 1 %
EOS (ABSOLUTE): 0 10*3/uL (ref 0.0–0.4)
Eos: 1 %
Hematocrit: 36.8 % (ref 34.0–46.6)
Hemoglobin: 12.3 g/dL (ref 11.1–15.9)
Immature Grans (Abs): 0 10*3/uL (ref 0.0–0.1)
Immature Granulocytes: 0 %
Lymphocytes Absolute: 1.1 10*3/uL (ref 0.7–3.1)
Lymphs: 26 %
MCH: 32 pg (ref 26.6–33.0)
MCHC: 33.4 g/dL (ref 31.5–35.7)
MCV: 96 fL (ref 79–97)
Monocytes Absolute: 0.3 10*3/uL (ref 0.1–0.9)
Monocytes: 8 %
Neutrophils Absolute: 2.6 10*3/uL (ref 1.4–7.0)
Neutrophils: 64 %
Platelets: 270 10*3/uL (ref 150–450)
RBC: 3.84 x10E6/uL (ref 3.77–5.28)
RDW: 12.4 % (ref 11.7–15.4)
WBC: 4.1 10*3/uL (ref 3.4–10.8)

## 2023-03-07 LAB — LIPID PANEL
Chol/HDL Ratio: 2.7 {ratio} (ref 0.0–4.4)
Cholesterol, Total: 258 mg/dL — ABNORMAL HIGH (ref 100–199)
HDL: 96 mg/dL (ref >39)
LDL Chol Calc (NIH): 155 mg/dL — ABNORMAL HIGH (ref 0–99)
Triglycerides: 47 mg/dL (ref 0–149)
VLDL Cholesterol Cal: 7 mg/dL (ref 5–40)

## 2023-03-07 LAB — IGG, IGA, IGM
IgA/Immunoglobulin A, Serum: 172 mg/dL (ref 87–352)
IgG (Immunoglobin G), Serum: 967 mg/dL (ref 586–1602)
IgM (Immunoglobulin M), Srm: 23 mg/dL — ABNORMAL LOW (ref 26–217)

## 2023-03-07 LAB — VITAMIN D 25 HYDROXY (VIT D DEFICIENCY, FRACTURES): Vit D, 25-Hydroxy: 44.6 ng/mL (ref 30.0–100.0)

## 2023-03-07 NOTE — Telephone Encounter (Signed)
Gave completed/signed FMLA to medical records to process for pt.

## 2023-03-14 ENCOUNTER — Telehealth: Payer: Self-pay | Admitting: Pharmacist

## 2023-03-14 NOTE — Telephone Encounter (Signed)
Pharmacy Patient Advocate Encounter   Received notification from Patient Pharmacy that prior authorization for Modafinil 200MG  tablets is required/requested.   Insurance verification completed.   The patient is insured through [facility] .   Per test claim: PA required; PA submitted to above mentioned insurance via CoverMyMeds Key/confirmation #/[facility] Status is pending

## 2023-03-14 NOTE — Telephone Encounter (Signed)
Pharmacy Patient Advocate Encounter  Received notification from [facility] that Prior Authorization for Modafinil 200MG  tablets has been APPROVED from 02/12/2023 to 03/13/2024   PA #/Case ID/Reference #: [id number]

## 2023-03-19 ENCOUNTER — Other Ambulatory Visit: Payer: Self-pay | Admitting: Neurology

## 2023-03-19 MED ORDER — AMPHETAMINE-DEXTROAMPHET ER 15 MG PO CP24: 15.0000 mg | ORAL_CAPSULE | ORAL | 0 refills | Status: DC

## 2023-03-19 NOTE — Telephone Encounter (Signed)
 Pt is requesting a refill for amphetamine-dextroamphetamine (ADDERALL XR) 15 MG 24 hr capsule.  [id number]

## 2023-03-19 NOTE — Telephone Encounter (Signed)
 Last seen2/12/25 Next appt 09/19/23 Dispenses    Dispensed Days Supply Quantity Provider Pharmacy  DEXTROAMP-AMPHET ER 15 MG CAP 11/13/2022 30 30 each Sater, Pearletha Furl, MD [facility] [phone] - [facility]...  DEXTROAMP-AMPHET ER 15 MG CAP 09/03/2022 30 30 each Sater, Pearletha Furl, MD [facility] [phone] - G...  DEXTROAMP-AMPHET ER 15 MG CAP 05/29/2022 30 30 each Sater, Pearletha Furl, MD [facility] [phone] - [facility]...  DEXTROAMP-AMPHET ER 15 MG CAP 04/12/2022 30 30 each Sater, Pearletha Furl, MD [facility] [phone] - [facility].[name] KitchenMarland Kitchen

## 2023-04-04 ENCOUNTER — Other Ambulatory Visit (HOSPITAL_COMMUNITY): Payer: Self-pay

## 2023-04-04 ENCOUNTER — Telehealth: Payer: Self-pay

## 2023-04-04 NOTE — Telephone Encounter (Signed)
 Pharmacy Patient Advocate Encounter   Received notification from CoverMyMeds that prior authorization for Amphetamine-Dextroamphet ER 15MG  er capsules is required/requested.   Insurance verification completed.   The patient is insured through [facility] .   Prior Authorization for Amphetamine-Dextroamphet ER 15MG  er capsules has been APPROVED from 04-04-2023 to 04-03-2024   PA #/Case ID/Reference #: [id number]

## 2023-04-17 DIAGNOSIS — N319 Neuromuscular dysfunction of bladder, unspecified: Secondary | ICD-10-CM | POA: Insufficient documentation

## 2023-05-17 ENCOUNTER — Other Ambulatory Visit: Payer: Self-pay | Admitting: Neurology

## 2023-05-17 DIAGNOSIS — G35D Multiple sclerosis, unspecified: Secondary | ICD-10-CM

## 2023-05-17 DIAGNOSIS — G35 Multiple sclerosis: Secondary | ICD-10-CM

## 2023-05-17 DIAGNOSIS — R5383 Other fatigue: Secondary | ICD-10-CM

## 2023-05-20 NOTE — Telephone Encounter (Signed)
 Last seen on 03/06/23 Follow up scheduled on 09/16/23  MODAFINIL  200 MG TABLET 04/13/2023 30 30 each Jorie Newness, MD [facility] [phone] - [facility]...    Rx pending to be signed

## 2023-05-23 ENCOUNTER — Other Ambulatory Visit: Payer: Self-pay | Admitting: Neurology

## 2023-05-23 MED ORDER — AMPHETAMINE-DEXTROAMPHET ER 15 MG PO CP24: 15.0000 mg | ORAL_CAPSULE | ORAL | 0 refills | Status: DC

## 2023-05-23 NOTE — Telephone Encounter (Signed)
Pt request refill for amphetamine-dextroamphetamine (ADDERALL XR) 15 MG 24 hr capsule send to  [id number]

## 2023-05-23 NOTE — Telephone Encounter (Signed)
 Dr. Godwin Lat- No mention that she is to continue this in last OV note. Ok to refill? If so, please e-scribe.  Last seen 03/06/23 and next f/u 09/19/23. Last refilled 04/04/23 #30.

## 2023-05-29 ENCOUNTER — Other Ambulatory Visit: Payer: Self-pay | Admitting: Neurology

## 2023-05-29 NOTE — Telephone Encounter (Signed)
 Last seen on 03/06/23 Follow up scheduled on 09/19/23

## 2023-06-07 ENCOUNTER — Telehealth: Payer: Self-pay | Admitting: Neurology

## 2023-06-07 NOTE — Telephone Encounter (Signed)
 Abigail Lewis- can you f/u with pt to get form for us  to complete? Thank you

## 2023-06-07 NOTE — Telephone Encounter (Signed)
 Pt states  she needs a new form for Disability sticker Pt states she requested one before but it expired. Pt request call back.

## 2023-06-11 NOTE — Telephone Encounter (Signed)
 Form give back to medical records to process for pt.

## 2023-06-11 NOTE — Telephone Encounter (Signed)
Form completed, pending MD signature.

## 2023-06-11 NOTE — Telephone Encounter (Signed)
 Abigail Lewis- can you try pt again today? Thank you

## 2023-06-21 NOTE — Therapy (Deleted)
 OUTPATIENT PHYSICAL THERAPY  PELVIC EVALUATION   Patient Name: Abigail Lewis MRN: [id number] DOB:07-Jan-1991, 33 y.o., female Today's Date: 06/21/2023  END OF SESSION:   Past Medical History:  Diagnosis Date   ANA positive 02/13/2019   Anxiety    Depression    Hyperlipidemia 12/03/2018   LDL=155   Multiple sclerosis, relapsing-remitting (HCC)    Past Surgical History:  Procedure Laterality Date   NO PAST SURGERIES     TOOTH EXTRACTION     Patient Active Problem List   Diagnosis Date Noted   Vitamin D  deficiency 04/26/2020   Encounter for monitoring immunomodulating therapy 04/26/2020   Multiple sclerosis (HCC) 05/25/2019   Demyelinating disease of central nervous system (HCC) 03/23/2019   High risk medication use 03/23/2019   Gait abnormality 03/23/2019   Ataxia 03/23/2019   Urinary dysfunction 03/23/2019   Numbness and tingling 03/08/2019   Hyperlipidemia 03/08/2019   Anxiety 03/08/2019   Tremor 03/07/2019   History of miscarriage 07/12/2016    PCP: Fonda Hymen, NP   REFERRING PROVIDER: Fonda Hymen, NP   REFERRING DIAG: N39.46 (ICD-10-CM) - Mixed incontinence   THERAPY DIAG:  No diagnosis found.  Rationale for Evaluation and Treatment: Rehabilitation  ONSET DATE: ***  SUBJECTIVE:                                                                                                                                                                                           SUBJECTIVE STATEMENT: *** Fluid intake:  PAIN:  Are you having pain? {yes/no:20286} NPRS scale: ***/10 Pain location: {pelvic pain location:27098}  Pain type: {type:313116} Pain description: {PAIN DESCRIPTION:21022940}   Aggravating factors: *** Relieving factors: ***  PRECAUTIONS: {Therapy precautions:24002}  RED FLAGS: {PT Red Flags:29287}   WEIGHT BEARING RESTRICTIONS: No  FALLS:  Has patient fallen in last 6 months? {fallsyesno:27318}  OCCUPATION:  ***  ACTIVITY LEVEL : ***  PLOF: {PLOF:24004}  PATIENT GOALS: ***  PERTINENT HISTORY:  MS; PCOS; ADHD;  Sexual abuse: {Yes/No:304960894}  BOWEL MOVEMENT: Pain with bowel movement: {yes/no:20286} Type of bowel movement:{PT BM type:27100} Fully empty rectum: {No/Yes:304960894} Leakage: {Yes/No:304960894} Pads: {Yes/No:304960894} Fiber supplement/laxative {YES/NO AS:20300}  URINATION: Pain with urination: {yes/no:20286} Fully empty bladder: {Yes/No:304960894}*** Stream: {PT urination:27102} Urgency: {YES/NO AS:20300} Frequency: *** Leakage: {PT leakage:27103} Pads: {Yes/No:304960894}  INTERCOURSE:  Ability to have vaginal penetration {YES/NO:21197} Pain with intercourse: {pain with intercourse PA:27099} Dryness{YES/NO AS:20300} Climax: *** Marinoff Scale: ***/3 Laxative:  PREGNANCY: Vaginal deliveries *** Tearing {Yes***/No:304960894} Episiotomy {YES/NO AS:20300} C-section deliveries *** Currently pregnant {Yes***/No:304960894}  PROLAPSE: {PT prolapse:27101}   OBJECTIVE:  Note: Objective measures were completed at Evaluation unless otherwise noted.  DIAGNOSTIC FINDINGS:  ***  PATIENT SURVEYS:  {rehab surveys:24030}  PFIQ-7: ***  COGNITION: Overall cognitive status: {cognition:24006}     SENSATION: Light touch: {intact/deficits:24005}  LUMBAR SPECIAL TESTS:  {lumbar special test:25242}  FUNCTIONAL TESTS:  {Functional tests:24029}  GAIT: Assistive device utilized: {Assistive devices:23999} Comments: ***  POSTURE: {posture:25561}   LUMBARAROM/PROM:  A/PROM A/PROM  eval  Flexion   Extension   Right lateral flexion   Left lateral flexion   Right rotation   Left rotation    (Blank rows = not tested)  LOWER EXTREMITY ROM:  {AROM/PROM:27142} ROM Right eval Left eval  Hip flexion    Hip extension    Hip abduction    Hip adduction    Hip internal rotation    Hip external rotation    Knee flexion    Knee extension    Ankle  dorsiflexion    Ankle plantarflexion    Ankle inversion    Ankle eversion     (Blank rows = not tested)  LOWER EXTREMITY MMT:  MMT Right eval Left eval  Hip flexion    Hip extension    Hip abduction    Hip adduction    Hip internal rotation    Hip external rotation    Knee flexion    Knee extension    Ankle dorsiflexion    Ankle plantarflexion    Ankle inversion    Ankle eversion     (Blank rows = not tested) PALPATION:   General: ***  Pelvic Alignment: ***  Abdominal: ***                External Perineal Exam: ***                             Internal Pelvic Floor: ***  Patient confirms identification and approves PT to assess internal pelvic floor and treatment {yes/no:20286}  PELVIC MMT:   MMT eval  Vaginal   Internal Anal Sphincter   External Anal Sphincter   Puborectalis   Diastasis Recti   (Blank rows = not tested)        TONE: ***  PROLAPSE: ***  TODAY'S TREATMENT:                                                                                                                              DATE: ***  EVAL ***   PATIENT EDUCATION:  Education details: *** Person educated: {Person educated:25204} Education method: {Education Method:25205} Education comprehension: {Education Comprehension:25206}  HOME EXERCISE PROGRAM: ***  ASSESSMENT:  CLINICAL IMPRESSION: Patient is a *** y.o. *** who was seen today for physical therapy evaluation and treatment for ***.   OBJECTIVE IMPAIRMENTS: {opptimpairments:25111}.   ACTIVITY LIMITATIONS: {activitylimitations:27494}  PARTICIPATION LIMITATIONS: {participationrestrictions:25113}  PERSONAL FACTORS: {Personal factors:25162} are also affecting patient's functional outcome.   REHAB POTENTIAL: {rehabpotential:25112}  CLINICAL DECISION MAKING: {clinical decision making:25114}  EVALUATION COMPLEXITY: {Evaluation complexity:25115}  GOALS: Goals reviewed with patient? {yes/no:20286}  SHORT TERM  GOALS: Target date: ***  *** Baseline: Goal status: INITIAL  2.  *** Baseline:  Goal status: INITIAL  3.  *** Baseline:  Goal status: INITIAL  4.  *** Baseline:  Goal status: INITIAL  5.  *** Baseline:  Goal status: INITIAL  6.  *** Baseline:  Goal status: INITIAL  LONG TERM GOALS: Target date: ***  *** Baseline:  Goal status: INITIAL  2.  *** Baseline:  Goal status: INITIAL  3.  *** Baseline:  Goal status: INITIAL  4.  *** Baseline:  Goal status: INITIAL  5.  *** Baseline:  Goal status: INITIAL  6.  *** Baseline:  Goal status: INITIAL  PLAN:  PT FREQUENCY: {rehab frequency:25116}  PT DURATION: {rehab duration:25117}  PLANNED INTERVENTIONS: {rehab planned interventions:25118::"97110-Therapeutic exercises","97530- Therapeutic [id number]- Neuromuscular re-education","97535- Self JXBJ","47829- Manual therapy"}  PLAN FOR NEXT SESSION: ***   Raesha Coonrod, PT 06/21/2023, 9:43 AM

## 2023-06-24 ENCOUNTER — Ambulatory Visit: Admitting: Physical Therapy

## 2023-08-27 ENCOUNTER — Encounter: Payer: Self-pay | Admitting: *Deleted

## 2023-08-27 ENCOUNTER — Telehealth: Payer: Self-pay | Admitting: *Deleted

## 2023-08-27 NOTE — Telephone Encounter (Signed)
 Received request below from pt. Per Dr. Vear, ok to write letter. Letter printed, pending MD signature

## 2023-08-27 NOTE — Telephone Encounter (Signed)
 Gave signed letter to medical records to process for pt.

## 2023-09-02 ENCOUNTER — Other Ambulatory Visit: Payer: Self-pay | Admitting: Neurology

## 2023-09-02 MED ORDER — AMPHETAMINE-DEXTROAMPHET ER 15 MG PO CP24: 15.0000 mg | ORAL_CAPSULE | ORAL | 0 refills | Status: DC

## 2023-09-02 NOTE — Telephone Encounter (Signed)
 Dr. Vear- since you approved, can you update last OV note to mention that you will continue to refill and for what? Thank you

## 2023-09-02 NOTE — Telephone Encounter (Signed)
 Pt is requesting a refill for amphetamine-dextroamphetamine (ADDERALL XR) 15 MG 24 hr capsule.  [id number]

## 2023-09-02 NOTE — Telephone Encounter (Signed)
 Dr. Vear- did you want her to continue on this? No mention in last note. Can you update last note if you want her to continue and e-scribe refill? Thank you  Last seen 03/06/23 and next f/u 09/19/23. Last refilled 05/23/23 #30. No mention in last note if MD was going to continue prescribing. Will route to MD for review/approval

## 2023-09-19 ENCOUNTER — Ambulatory Visit (INDEPENDENT_AMBULATORY_CARE_PROVIDER_SITE_OTHER): Payer: Federal, State, Local not specified - PPO | Admitting: Neurology

## 2023-09-19 ENCOUNTER — Encounter: Payer: Self-pay | Admitting: Neurology

## 2023-09-19 VITALS — BP 95/64 | HR 78 | Ht 63.0 in | Wt 159.0 lb

## 2023-09-19 DIAGNOSIS — R39198 Other difficulties with micturition: Secondary | ICD-10-CM

## 2023-09-19 DIAGNOSIS — G35D Multiple sclerosis, unspecified: Secondary | ICD-10-CM

## 2023-09-19 DIAGNOSIS — G35 Multiple sclerosis: Secondary | ICD-10-CM | POA: Diagnosis not present

## 2023-09-19 DIAGNOSIS — R41840 Attention and concentration deficit: Secondary | ICD-10-CM

## 2023-09-19 DIAGNOSIS — E559 Vitamin D deficiency, unspecified: Secondary | ICD-10-CM | POA: Diagnosis not present

## 2023-09-19 DIAGNOSIS — Z79899 Other long term (current) drug therapy: Secondary | ICD-10-CM

## 2023-09-19 DIAGNOSIS — R269 Unspecified abnormalities of gait and mobility: Secondary | ICD-10-CM

## 2023-09-19 DIAGNOSIS — G35A Relapsing-remitting multiple sclerosis: Secondary | ICD-10-CM

## 2023-09-19 MED ORDER — AMPHETAMINE-DEXTROAMPHET ER 25 MG PO CP24: 25.0000 mg | ORAL_CAPSULE | ORAL | 0 refills | Status: DC

## 2023-09-19 MED ORDER — DALFAMPRIDINE ER 10 MG PO TB12: 10.0000 mg | ORAL_TABLET | Freq: Two times a day (BID) | ORAL | 11 refills | Status: DC

## 2023-09-19 NOTE — Progress Notes (Signed)
 Pt did not have copy of insurance card at check in. Discussed uploading to mychart. Avery/check in will also call patient to discuss sending copy to us  to upload. We need copy in order to send in Ocrevus Zunovo start form. I checked with [facility] and they do not have copy of pt insurance card.

## 2023-09-19 NOTE — Progress Notes (Signed)
 [facility]  PATIENT: Abigail Lewis DOB: [birth year]  REFERRING DOCTOR OR PCP:   Dr. McComb (Ob/Gyn); [facility] (PCP) SOURCE: Notes from recent emergency room and hospital stay and neurology, imaging and lab reports, MRI images personally reviewed.  _________________________________   HISTORICAL  CHIEF COMPLAINT:  Chief Complaint  Patient presents with   RM10/MS    Pt is here Alone. Pt states that she would like to talk about Ocrevus. Pt states that her hair has falling out and her teeth keep breaking. Pt states she would like to discuss her Adderall, she feels as though it wears off too fast. Pt needs refill on Dalfampridine .     HISTORY OF PRESENT ILLNESS:  Abigail Lewis is a 33 y.o. woman with relapsing remitting MS.  Update 09/19/2023 She is on Ocrevus.  She tolerates it well.   Next infusion is 10/2023.  She has no recent exacerbation or new symptoms.   11/09/2021 IgG and IgM were normal.   Last MRI was from 2021 and showed 1 lesion not present on the previous MRI, prompting initiation of Ocrevus.  She is walking better and can now go two miles without any assistive device.   Ampyra  has helped her gait and balance - she stopped a week and noted a difference so went back on..    She notes that her legs seem stronger since starting dalfampridine .    Metoprolol  may have helped some.   Bladder function is usually ok on Vesicare  and she has rare urge incontinence.  She has had some hesitancy but more urgency issues.  She has bowel issues and had fecal incontinence very rarely.     Vision is fine.  Recent eye appt showed no evidence of ON.    She notes some fatigue. Sleep is variable. Some benefit from modafinil  but it seems to be helping less than last year. She has insomnia but no worse, worse with sleep maintenance. She notes some anxiety but no depression   She is on sertraline  and takes trazodone  at bedtime.  She has reduced cognitive fog with  focus/attention.  MRI images are unusual.   She presented with  a longitudinal lesion on cervical spine MRI.  However, anti-NMO and anti-MOG were both negative. She had multiple other more classic appearing spine and brain MS lesions including some of the brain that enhanced.   Additionally she had oligoclonal bands in the CSF.  Therefore, MS is more likely.   Since seronegative NMOSD not ruled out, we opted for a B cell depleter as therapy.   She was low positive for SSA (less than 0.9 is normal and she was 1.1)  She does not have dry eyes or mouth or other manifestations.   The spinal cord plaque was LETM  She is on vitamin D  supplements   She is exercising some and even went jogging a short distance.     She stopped metoprolol  as tremors were not too bad and BP was low  MRI brain, cervical and thoracic spine 05/04/2022 showed no new symptoms  She is contemplating pregnancy.   We discussed that Ocrevus could have some effect on fetal hematology during 2nd half pregnancy and we could consider change to copaxone if she does become pregnant.    MS history:  In 2018 she noted that her legs would get numb and achy.   Symptoms fluctuated but persisted through 2019 and 2020.  She saw Dr. Tobie at [facility] neurology in December 2019 and  again for an EMG October 2020 which was normal.  In August 2020 she began to experience lightheadedness with dizziness but not vertigo.  In October, she was noting more issues with her legs experiencing weakness and clumsiness.    She had no falls but started to use a walker due to poor gait.    Symptoms progressed over the rest of 2020.  In January, her left hand started to have a tremor.    In early February 2021, she was unable to walk and needed to crawl down the stairs.   Her voice was slurred.  She had mild right visual changes.   She went to the [facility] ED and MRI's of the brain and cervical spine were concerning for a demyelinating disorder.   She received 5 days of  IV Solu-Medrol  with some improvement. Sh e continued to improve over the next few months.  Laboratory tests: While in the hospital, she had a lumbar puncture showing 5 oligoclonal bands and elevated IgG index .   Labwork showed positive ANA with elevated chromatin Ab and negative SSA/SSB initially and then milldy increased SSA 2 days later, negative SPEP/IEF, negative anti-NMO.   She is HIV negative.   We subsequently checked the [facility] anti-NMO and MOG and they were negative.    Imaging:  MRIs of the brain, cervical spine and thoracic spine performed between February 13 and March 09, 2019.  The MRI of the brain shows multiple T2/FLAIR hyperintense foci in the cerebellum, right middle cerebellar peduncle, brainstem including periaqueductal gray and hemispheres.  Some of the foci enhanced after contrast.  Additionally, there appeared to be enhancement within the left optic nerve MRI of the cervical spine showed a longitudinal lesion from C1-C2 throughC4 and patchy lesions elsewhere in the lower cervical and upper thoracic spinal cord.  MRI of the thoracic spine showed patchy lesions throughout the spinal cord.  There was no abnormal enhancement in the spinal cord.  MRI brain 10/20/2019 shows multiple T2/FLAIR hyperintense foci in the cerebellum, brainstem, thalamus and hemispheres in a pattern and configuration consistent with chronic demyelinating plaque associated with multiple sclerosis.  None of the foci enhances or appears to be acute.  There appears to be 1 nonenhancing focus in the cerebellum that was not present on the 03/07/2019 MRI.  All the foci that enhanced on the earlier brain MRI no longer do so.      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  MRI cervical spine 10/20/2019 shows Multiple T2 hyperintense foci within the brainstem, cerebellum and spinal cord in a pattern and configuration consistent with chronic demyelinating plaque associated with multiple sclerosis.  None of  the foci appear to be acute.  They did not enhance.  The spinal cord lesions were all present on the 03/07/2019 MRI.  MRI brain, cervical and thoracic spine 05/04/2022 showed no new symptoms    REVIEW OF SYSTEMS: Constitutional: No fevers, chills, sweats, or change in appetite Eyes: No visual changes, double vision, eye pain Ear, nose and throat: No hearing loss, ear pain, nasal congestion, sore throat Cardiovascular: No chest pain, palpitations Respiratory:  No shortness of breath at rest or with exertion.   No wheezes GastrointestinaI: No nausea, vomiting, diarrhea, abdominal pain, fecal incontinence Genitourinary:  No dysuria, urinary retention or frequency.  No nocturia. Musculoskeletal:  No neck pain, back pain Integumentary: No rash, pruritus, skin lesions Neurological: as above Psychiatric: She has had anxiety and depression. Endocrine: No palpitations, diaphoresis, change in appetite,  change in weigh or increased thirst Hematologic/Lymphatic:  No anemia, purpura, petechiae. Allergic/Immunologic: No itchy/runny eyes, nasal congestion, recent allergic reactions, rashes  ALLERGIES: Allergies  Allergen Reactions   Penicillins Swelling    HOME MEDICATIONS:  Current Outpatient Medications:    [place] 610 MG CAPS, Take by mouth., Disp: , Rfl:    gabapentin  (NEURONTIN ) 300 MG capsule, TAKE 1 CAPSULE BY MOUTH EVERY MORNING AND EVERY EVENING AND 2 CAPSULE AT BEDTIME, Disp: 120 capsule, Rfl: 2   Maca Root 500 MG CAPS, Take by mouth., Disp: , Rfl:    modafinil  (PROVIGIL ) 200 MG tablet, TAKE 1 TABLETBY MOUTH EVERY MORNING. MAKE AN APPT FOR REFILLS, Disp: 30 tablet, Rfl: 5   ocrelizumab (OCREVUS) 300 MG/10ML injection, Inject 600 mg into the vein every 6 (six) months. [facility]., Disp: , Rfl:    saw palmetto 500 MG capsule, Take 500 mg by mouth daily., Disp: , Rfl:    sertraline  (ZOLOFT ) 50 MG tablet, Take 1 tablet (50 mg total) by mouth daily., Disp: 30 tablet, Rfl: 0    solifenacin  (VESICARE ) 10 MG tablet, Take 1 tablet (10 mg total) by mouth daily., Disp: 30 tablet, Rfl: 5   traZODone  (DESYREL ) 50 MG tablet, TAKE 1/2 TO 1 TABLET BY MOUTH DAILY AT BEDTIME, Disp: 90 tablet, Rfl: 3   Vitamin D , Ergocalciferol , (DRISDOL ) 1.25 MG (50000 UNIT) CAPS capsule, Take 1 capsule by mouth once a week., Disp: , Rfl:    amphetamine -dextroamphetamine (ADDERALL XR) 25 MG 24 hr capsule, Take 1 capsule by mouth every morning., Disp: 30 capsule, Rfl: 0   dalfampridine  10 MG TB12, Take 1 tablet (10 mg total) by mouth in the morning and at bedtime., Disp: 60 tablet, Rfl: 11   pravastatin  (PRAVACHOL ) 20 MG tablet, Take 20 mg by mouth daily. (Patient not taking: Reported on 09/19/2023), Disp: , Rfl:    VITAMIN D  PO, Take 5,000 Units by mouth daily. (Patient not taking: Reported on 09/19/2023), Disp: , Rfl:   PAST MEDICAL HISTORY: Past Medical History:  Diagnosis Date   ANA positive 02/13/2019   Anxiety    Depression    Hyperlipidemia 12/03/2018   LDL=155   Multiple sclerosis, relapsing-remitting (HCC)     PAST SURGICAL HISTORY: Past Surgical History:  Procedure Laterality Date   NO PAST SURGERIES     TOOTH EXTRACTION      FAMILY HISTORY: Family History  Problem Relation Age of Onset   Stroke Mother    Diabetes Father    Healthy Sister    Autism Brother    Multiple sclerosis Neg Hx     SOCIAL HISTORY:  Social History   Socioeconomic History   Marital status: Single    Spouse name: Not on file   Number of children: 0   Years of education: 12   Highest education level: Not on file  Occupational History   Occupation: [place], post office  Tobacco Use   Smoking status: Never   Smokeless tobacco: Never  Vaping Use   Vaping status: Never Used  Substance and Sexual Activity   Alcohol use: Not Currently    Comment: wine sometimes    Drug use: No   Sexual activity: Yes    Birth control/protection: Pill  Other Topics Concern   Not on file  Social History  Narrative   Right handed    Caffeine use: sometimes   She works as a Office manager and part-time as Lawyer.   She lives alone.  No children.  Social Drivers of Corporate investment banker Strain: Low Risk  (04/17/2023)   Received from [facility]   Overall Financial Resource Strain (CARDIA)    Difficulty of Paying Living Expenses: Not hard at all  Food Insecurity: No Food Insecurity (04/17/2023)   Received from [facility]   Hunger Vital Sign    Within the past 12 months, you worried that your food would run out before you got the money to buy more.: Never true    Within the past 12 months, the food you bought just didn't last and you didn't have money to get more.: Never true  Transportation Needs: No Transportation Needs (04/17/2023)   Received from [facility] - Transportation    Lack of Transportation (Medical): No    Lack of Transportation (Non-Medical): No  Physical Activity: Not on file  Stress: Not on file  Social Connections: Unknown (05/29/2021)   Received from [facility]   Social Network    Social Network: Not on file  Intimate Partner Violence: Unknown (04/24/2021)   Received from [facility]   HITS    Physically Hurt: Not on file    Insult or Talk Down To: Not on file    Threaten Physical Harm: Not on file    Scream or Curse: Not on file     PHYSICAL EXAM  Vitals:   09/19/23 0821  BP: 95/64  Pulse: 78  SpO2: 99%  Weight: 159 lb (72.1 kg)  Height: 5' 3 (1.6 m)     Body mass index is 28.17 kg/m.  No results found.   General: The patient is well-developed and well-nourished and in no acute distress  HEENT:  Head is [place]/AT.  Sclera are anicteric.     Skin: Extremities are without rash or  edema.   Neurologic Exam  Mental status: The patient is alert and oriented x 3 at the time of the examination. The patient has apparent normal recent and remote memory, with an apparently normal attention span and concentration ability.   Speech is  normal.  Cranial nerves: Extraocular movements are full.    Color vision is symmetric.  Facial strength is normal.  Trapezius and sternocleidomastoid strength is normal. No dysarthria is noted.    No obvious hearing deficits are noted.  Motor:  Muscle bulk is normal.   Tone is normal. Strength is  5 / 5 in all 4 extremities.   Sensory: She had intact sensation to touch, temperature and vibration.  Coordination: She has normal finger-nose-finger and reduced left heel-to-shin..   Gait and station: Station is normal.  Gait is mildly  wide.  Tandem gait is reduced but stable.  No Romberg sign.   Reflexes: Deep tendon reflexes are symmetric and normal in arms and increased at the legs.  There is no ankle clonus.SABRA          DIAGNOSTIC DATA (LABS, IMAGING, TESTING) - I reviewed patient records, labs, notes, testing and imaging myself where available.  Lab Results  Component Value Date   WBC 4.1 03/06/2023   HGB 12.3 03/06/2023   HCT 36.8 03/06/2023   MCV 96 03/06/2023   PLT 270 03/06/2023      Component Value Date/Time   NA 137 03/06/2023 0913   K 4.4 03/06/2023 0913   CL 103 03/06/2023 0913   CO2 23 03/06/2023 0913   GLUCOSE 76 03/06/2023 0913   GLUCOSE 71 05/04/2022 1058   BUN 9 03/06/2023 0913   CREATININE 0.82 03/06/2023  0913   CALCIUM 9.6 03/06/2023 0913   PROT 6.7 03/06/2023 0913   ALBUMIN 4.5 03/06/2023 0913   AST 19 03/06/2023 0913   ALT 28 03/06/2023 0913   ALKPHOS 51 03/06/2023 0913   BILITOT 0.3 03/06/2023 0913   GFRNONAA >60 05/04/2022 1058   GFRAA >60 03/12/2019 0219   Lab Results  Component Value Date   CHOL 258 (H) 03/06/2023   HDL 96 03/06/2023   LDLCALC 155 (H) 03/06/2023   TRIG 47 03/06/2023   CHOLHDL 2.7 03/06/2023   Lab Results  Component Value Date   HGBA1C 5.1 03/10/2019   Lab Results  Component Value Date   VITAMINB12 894 03/08/2019   Lab Results  Component Value Date   TSH 2.212 03/08/2019       ASSESSMENT AND PLAN  Attention  deficit  Multiple sclerosis (HCC) - Plan: dalfampridine  10 MG TB12  Gait abnormality - Plan: dalfampridine  10 MG TB12  Vitamin D  deficiency  Urinary dysfunction  High risk medication use   1.  Continue Ocrevus but switch to Zunovo.  Check lab work.   2.   She had thought about pregnancy but not this year.    If she becomes pregnant, we will initiate Copaxone during the pregnancy and get her back on Ocrevus shortly after delivery  3.    Continue gabapentin  to 300 mg tid (may take 4).  However, since her dysesthetic sensations are better she could try to lower the dose.      4.   Stay active and exercise as tolerated.   5.   Increase Adderall Return in 6 months or sooner if there are new or worsening neurologic symptoms.   Emlyn Maves A. Vear, MD, Teola RENO 09/19/2023, 9:00 AM Certified in Neurology, Clinical Neurophysiology, Sleep Medicine and Neuroimaging  [facility] [address] [place], [place] [address] [phone]

## 2023-09-20 ENCOUNTER — Ambulatory Visit: Payer: Self-pay | Admitting: Neurology

## 2023-09-20 LAB — CBC WITH DIFFERENTIAL/PLATELET
Basophils Absolute: 0 x10E3/uL (ref 0.0–0.2)
Basos: 1 %
EOS (ABSOLUTE): 0.1 x10E3/uL (ref 0.0–0.4)
Eos: 2 %
Hematocrit: 38.4 % (ref 34.0–46.6)
Hemoglobin: 12.5 g/dL (ref 11.1–15.9)
Immature Grans (Abs): 0 x10E3/uL (ref 0.0–0.1)
Immature Granulocytes: 0 %
Lymphocytes Absolute: 1.4 x10E3/uL (ref 0.7–3.1)
Lymphs: 36 %
MCH: 31.6 pg (ref 26.6–33.0)
MCHC: 32.6 g/dL (ref 31.5–35.7)
MCV: 97 fL (ref 79–97)
Monocytes Absolute: 0.3 x10E3/uL (ref 0.1–0.9)
Monocytes: 9 %
Neutrophils Absolute: 2 x10E3/uL (ref 1.4–7.0)
Neutrophils: 52 %
Platelets: 254 x10E3/uL (ref 150–450)
RBC: 3.96 x10E6/uL (ref 3.77–5.28)
RDW: 12.1 % (ref 11.7–15.4)
WBC: 3.8 x10E3/uL (ref 3.4–10.8)

## 2023-09-20 LAB — IGG, IGA, IGM
IgA/Immunoglobulin A, Serum: 168 mg/dL (ref 87–352)
IgG (Immunoglobin G), Serum: 956 mg/dL (ref 586–1602)
IgM (Immunoglobulin M), Srm: 22 mg/dL — ABNORMAL LOW (ref 26–217)

## 2023-10-03 ENCOUNTER — Telehealth: Payer: Self-pay | Admitting: *Deleted

## 2023-10-03 NOTE — Telephone Encounter (Signed)
 Faxed complete/signed Ocrevus Zunovo start form to [facility] at [phone]. Received fax confirmation. Gave completed start form/orders to [facility] for them to process.

## 2023-10-27 ENCOUNTER — Other Ambulatory Visit: Payer: Self-pay | Admitting: Neurology

## 2023-10-28 ENCOUNTER — Other Ambulatory Visit: Payer: Self-pay

## 2023-10-30 NOTE — Telephone Encounter (Signed)
 I have reached out to [facility] in [place] for an update on this case.

## 2023-11-11 NOTE — Telephone Encounter (Signed)
 Per [facility], patient is scheduled for Ocrevus Zunovo on 10/28.

## 2023-11-18 ENCOUNTER — Other Ambulatory Visit: Payer: Self-pay | Admitting: Neurology

## 2023-11-18 DIAGNOSIS — G35D Multiple sclerosis, unspecified: Secondary | ICD-10-CM

## 2023-11-18 DIAGNOSIS — R5383 Other fatigue: Secondary | ICD-10-CM

## 2023-11-18 NOTE — Telephone Encounter (Signed)
 Last seen on 09/19/23 Follow up scheduled on 04/23/24    Dispensed Days Supply Quantity Provider Pharmacy  MODAFINIL  200 MG TABLET 10/19/2023 30 30 each Vear Charlie LABOR, MD [facility] [phone] - G...     Rx pending to be signed

## 2023-11-21 ENCOUNTER — Other Ambulatory Visit: Payer: Self-pay | Admitting: Neurology

## 2023-11-21 MED ORDER — AMPHETAMINE-DEXTROAMPHET ER 25 MG PO CP24: 25.0000 mg | ORAL_CAPSULE | ORAL | 0 refills | Status: AC

## 2023-11-21 NOTE — Telephone Encounter (Signed)
 Requested Prescriptions   Pending Prescriptions Disp Refills   amphetamine -dextroamphetamine (ADDERALL XR) 25 MG 24 hr capsule 30 capsule 0    Sig: Take 1 capsule by mouth every morning.   Last seen 09/19/23 Next appt 04/23/24 Dispenses   Dispensed Days Supply Quantity Provider Pharmacy  DEXTROAMP-AMPHET ER 25 MG CAP 09/19/2023 30 30 each Sater, Charlie LABOR, MD [facility] [phone] - G...  DEXTROAMP-AMPHET ER 15 MG CAP 09/02/2023 30 30 each Sater, Charlie LABOR, MD [facility] [phone] - G...  DEXTROAMP-AMPHET ER 15 MG CAP 05/23/2023 30 30 each Sater, Charlie LABOR, MD [facility] [phone] - G...  DEXTROAMP-AMPHET ER 15 MG CAP 04/04/2023 30 30 each Sater, Charlie LABOR, MD [facility] [phone] - G.SABRASABRA

## 2023-11-21 NOTE — Telephone Encounter (Signed)
 Pt called needing a refill request for her amphetamine -dextroamphetamine (ADDERALL XR) 25 MG 24 hr capsule [facility] [address]

## 2023-11-28 NOTE — Telephone Encounter (Signed)
 Patient's first Zunovo injection was 11/19/2023.

## 2023-12-03 ENCOUNTER — Other Ambulatory Visit (HOSPITAL_COMMUNITY): Payer: Self-pay

## 2023-12-03 ENCOUNTER — Telehealth: Payer: Self-pay

## 2023-12-03 NOTE — Telephone Encounter (Signed)
 Pharmacy Patient Advocate Encounter   Received notification from Fax that prior authorization for Dalfampridine  tablet is required/requested.   Insurance verification completed.   The patient is insured through [employer].   Per test claim: PA required; PA submitted to above mentioned insurance via Fax Key/confirmation #/[id number] Status is pending  Faxed clinical questions form and most recent OV notes to [phone].

## 2023-12-09 ENCOUNTER — Telehealth: Payer: Self-pay | Admitting: Neurology

## 2023-12-09 DIAGNOSIS — R269 Unspecified abnormalities of gait and mobility: Secondary | ICD-10-CM

## 2023-12-09 DIAGNOSIS — G35D Multiple sclerosis, unspecified: Secondary | ICD-10-CM

## 2023-12-09 MED ORDER — DALFAMPRIDINE ER 10 MG PO TB12: 10.0000 mg | ORAL_TABLET | Freq: Two times a day (BID) | ORAL | 4 refills | Status: AC

## 2023-12-09 NOTE — Telephone Encounter (Signed)
 Pt called to request to get medication refill Pt she would need a new Rx be sent over to [facility]    dalfampridine  10 MG TB12  Pt didn't;t have [facility] number  on hand at time of call

## 2023-12-09 NOTE — Telephone Encounter (Signed)
 Last seen on 09/19/23 Follow up scheduled on 04/23/24

## 2023-12-13 ENCOUNTER — Other Ambulatory Visit (HOSPITAL_COMMUNITY): Payer: Self-pay

## 2023-12-13 NOTE — Telephone Encounter (Signed)
 Pharmacy Patient Advocate Encounter  Received notification from [facility] that Prior Authorization for Dalfampridine  has been APPROVED from 12/03/2023 to 12/02/2024   PA #/Case ID/Reference #: [id number]

## 2024-01-14 ENCOUNTER — Other Ambulatory Visit (HOSPITAL_COMMUNITY): Payer: Self-pay

## 2024-01-21 ENCOUNTER — Other Ambulatory Visit (HOSPITAL_COMMUNITY): Payer: Self-pay

## 2024-01-25 ENCOUNTER — Other Ambulatory Visit: Payer: Self-pay | Admitting: Neurology

## 2024-04-23 ENCOUNTER — Ambulatory Visit: Admitting: Neurology
